# Patient Record
Sex: Female | Born: 1960 | Race: White | Hispanic: No | Marital: Married | Smoking: Former smoker
Health system: Southern US, Community
[De-identification: ages and names within clinical notes are randomized; demographics above are authoritative.]

## PROBLEM LIST (undated history)

## (undated) DIAGNOSIS — Z87442 Personal history of urinary calculi: Secondary | ICD-10-CM

## (undated) DIAGNOSIS — F419 Anxiety disorder, unspecified: Secondary | ICD-10-CM

## (undated) DIAGNOSIS — M419 Scoliosis, unspecified: Secondary | ICD-10-CM

## (undated) DIAGNOSIS — I10 Essential (primary) hypertension: Secondary | ICD-10-CM

## (undated) DIAGNOSIS — E7800 Pure hypercholesterolemia, unspecified: Secondary | ICD-10-CM

## (undated) DIAGNOSIS — E079 Disorder of thyroid, unspecified: Secondary | ICD-10-CM

## (undated) DIAGNOSIS — E0590 Thyrotoxicosis, unspecified without thyrotoxic crisis or storm: Secondary | ICD-10-CM

## (undated) HISTORY — PX: ABDOMINAL HYSTERECTOMY: SHX81

## (undated) HISTORY — PX: TUBAL LIGATION: SHX77

---

## 2004-12-05 ENCOUNTER — Emergency Department (HOSPITAL_COMMUNITY): Admission: EM | Admit: 2004-12-05 | Discharge: 2004-12-06 | Payer: Self-pay | Admitting: *Deleted

## 2007-08-28 ENCOUNTER — Emergency Department (HOSPITAL_COMMUNITY): Admission: EM | Admit: 2007-08-28 | Discharge: 2007-08-28 | Payer: Self-pay | Admitting: Emergency Medicine

## 2008-02-23 ENCOUNTER — Emergency Department (HOSPITAL_COMMUNITY): Admission: EM | Admit: 2008-02-23 | Discharge: 2008-02-23 | Payer: Self-pay | Admitting: Emergency Medicine

## 2008-03-12 ENCOUNTER — Ambulatory Visit (HOSPITAL_COMMUNITY): Admission: RE | Admit: 2008-03-12 | Discharge: 2008-03-12 | Payer: Self-pay | Admitting: Urology

## 2008-03-14 ENCOUNTER — Emergency Department (HOSPITAL_COMMUNITY): Admission: RE | Admit: 2008-03-14 | Discharge: 2008-03-14 | Payer: Self-pay | Admitting: Urology

## 2008-08-07 ENCOUNTER — Emergency Department (HOSPITAL_COMMUNITY): Admission: EM | Admit: 2008-08-07 | Discharge: 2008-08-07 | Payer: Self-pay | Admitting: Emergency Medicine

## 2008-08-08 ENCOUNTER — Ambulatory Visit (HOSPITAL_COMMUNITY): Admission: RE | Admit: 2008-08-08 | Discharge: 2008-08-08 | Payer: Self-pay | Admitting: Urology

## 2008-08-25 ENCOUNTER — Emergency Department (HOSPITAL_COMMUNITY): Admission: EM | Admit: 2008-08-25 | Discharge: 2008-08-25 | Payer: Self-pay | Admitting: Emergency Medicine

## 2008-12-11 ENCOUNTER — Ambulatory Visit (HOSPITAL_COMMUNITY): Admission: RE | Admit: 2008-12-11 | Discharge: 2008-12-11 | Payer: Self-pay | Admitting: Urology

## 2009-12-30 ENCOUNTER — Inpatient Hospital Stay (HOSPITAL_COMMUNITY): Admission: EM | Admit: 2009-12-30 | Discharge: 2010-01-01 | Payer: Self-pay | Admitting: Emergency Medicine

## 2010-05-13 ENCOUNTER — Emergency Department (HOSPITAL_COMMUNITY): Admission: EM | Admit: 2010-05-13 | Discharge: 2010-05-13 | Payer: Self-pay | Admitting: Emergency Medicine

## 2010-09-13 ENCOUNTER — Encounter: Payer: Self-pay | Admitting: Urology

## 2010-11-10 LAB — STONE ANALYSIS: Stone Weight KSTONE: 0.039 g

## 2010-11-10 LAB — GLUCOSE, CAPILLARY
Glucose-Capillary: 136 mg/dL — ABNORMAL HIGH (ref 70–99)
Glucose-Capillary: 164 mg/dL — ABNORMAL HIGH (ref 70–99)

## 2010-11-10 LAB — BASIC METABOLIC PANEL
BUN: 18 mg/dL (ref 6–23)
CO2: 26 mEq/L (ref 19–32)
Calcium: 9.3 mg/dL (ref 8.4–10.5)
Chloride: 103 mEq/L (ref 96–112)
Creatinine, Ser: 0.8 mg/dL (ref 0.4–1.2)
GFR calc Af Amer: >60 mL/min (ref >60)
GFR calc non Af Amer: >60 mL/min (ref >60)
Glucose, Bld: 198 mg/dL — ABNORMAL HIGH (ref 70–99)
Potassium: 4.1 mEq/L (ref 3.5–5.1)

## 2010-11-10 LAB — URINALYSIS, ROUTINE W REFLEX MICROSCOPIC
Bilirubin Urine: NEGATIVE
Glucose, UA: NEGATIVE mg/dL
Nitrite: NEGATIVE
Specific Gravity, Urine: 1.03 (ref 1.005–1.030)
pH: 6 (ref 5.0–8.0)

## 2010-11-10 LAB — HEPATIC FUNCTION PANEL
ALT: 21 U/L (ref 0–35)
Bilirubin, Direct: 0.1 mg/dL (ref 0.0–0.3)
Indirect Bilirubin: 0.6 mg/dL (ref 0.3–0.9)
Total Protein: 6.4 g/dL (ref 6.0–8.3)

## 2010-11-10 LAB — CBC
HCT: 32.1 % — ABNORMAL LOW (ref 36.0–46.0)
Hemoglobin: 12.3 g/dL (ref 12.0–15.0)
MCV: 95.3 fL (ref 78.0–100.0)
Platelets: 181 10*3/uL (ref 150–400)
Platelets: 199 10*3/uL (ref 150–400)
RBC: 3.64 MIL/uL — ABNORMAL LOW (ref 3.87–5.11)
RDW: 12.7 % (ref 11.5–15.5)
RDW: 12.9 % (ref 11.5–15.5)
WBC: 11.4 10*3/uL — ABNORMAL HIGH (ref 4.0–10.5)

## 2010-11-10 LAB — LACTIC ACID, PLASMA: Lactic Acid, Venous: 2.4 mmol/L — ABNORMAL HIGH (ref 0.5–2.2)

## 2010-11-10 LAB — DIFFERENTIAL
Basophils Absolute: 0 10*3/uL (ref 0.0–0.1)
Basophils Absolute: 0 10*3/uL (ref 0.0–0.1)
Basophils Relative: 0 % (ref 0–1)
Eosinophils Absolute: 0 10*3/uL (ref 0.0–0.7)
Eosinophils Absolute: 0.1 10*3/uL (ref 0.0–0.7)
Eosinophils Relative: 1 % (ref 0–5)
Lymphocytes Relative: 16 % (ref 12–46)
Lymphs Abs: 1.9 10*3/uL (ref 0.7–4.0)
Monocytes Absolute: 0.5 10*3/uL (ref 0.1–1.0)

## 2010-12-07 LAB — URINALYSIS, ROUTINE W REFLEX MICROSCOPIC
Bilirubin Urine: NEGATIVE
Ketones, ur: NEGATIVE mg/dL
Nitrite: NEGATIVE
Urobilinogen, UA: 0.2 mg/dL (ref 0.0–1.0)

## 2010-12-07 LAB — URINE MICROSCOPIC-ADD ON

## 2011-02-05 ENCOUNTER — Encounter (HOSPITAL_COMMUNITY): Payer: Self-pay | Admitting: Radiology

## 2011-02-05 ENCOUNTER — Emergency Department (HOSPITAL_COMMUNITY)
Admission: EM | Admit: 2011-02-05 | Discharge: 2011-02-06 | Disposition: A | Discharge status: Home / Self Care | Payer: Self-pay | Attending: Emergency Medicine | Admitting: Emergency Medicine

## 2011-02-05 ENCOUNTER — Emergency Department (HOSPITAL_COMMUNITY): Payer: Self-pay

## 2011-02-05 DIAGNOSIS — M545 Low back pain, unspecified: Secondary | ICD-10-CM | POA: Insufficient documentation

## 2011-02-05 DIAGNOSIS — E119 Type 2 diabetes mellitus without complications: Secondary | ICD-10-CM | POA: Insufficient documentation

## 2011-02-05 DIAGNOSIS — Z87442 Personal history of urinary calculi: Secondary | ICD-10-CM | POA: Insufficient documentation

## 2011-02-05 DIAGNOSIS — Z79899 Other long term (current) drug therapy: Secondary | ICD-10-CM | POA: Insufficient documentation

## 2011-02-05 DIAGNOSIS — R109 Unspecified abdominal pain: Secondary | ICD-10-CM | POA: Insufficient documentation

## 2011-02-05 DIAGNOSIS — I10 Essential (primary) hypertension: Secondary | ICD-10-CM | POA: Insufficient documentation

## 2011-02-05 HISTORY — DX: Essential (primary) hypertension: I10

## 2011-02-05 LAB — BASIC METABOLIC PANEL
BUN: 14 mg/dL (ref 6–23)
CO2: 30 mEq/L (ref 19–32)
Calcium: 10.6 mg/dL — ABNORMAL HIGH (ref 8.4–10.5)
Chloride: 101 mEq/L (ref 96–112)
Creatinine, Ser: 0.62 mg/dL (ref 0.50–1.10)
GFR calc Af Amer: >60 mL/min (ref >60)
GFR calc non Af Amer: >60 mL/min (ref >60)
Glucose, Bld: 153 mg/dL — ABNORMAL HIGH (ref 70–99)
Potassium: 3.8 mEq/L (ref 3.5–5.1)
Sodium: 140 mEq/L (ref 135–145)

## 2011-02-05 LAB — DIFFERENTIAL
Basophils Absolute: 0 10*3/uL (ref 0.0–0.1)
Lymphocytes Relative: 43 % (ref 12–46)
Monocytes Absolute: 0.5 10*3/uL (ref 0.1–1.0)
Neutro Abs: 3.5 10*3/uL (ref 1.7–7.7)

## 2011-02-05 LAB — URINALYSIS, ROUTINE W REFLEX MICROSCOPIC
Bilirubin Urine: NEGATIVE
Hgb urine dipstick: NEGATIVE
Nitrite: NEGATIVE
Specific Gravity, Urine: <1.005 — ABNORMAL LOW (ref 1.005–1.030)
pH: 7 (ref 5.0–8.0)

## 2011-02-05 LAB — CBC
HCT: 35.2 % — ABNORMAL LOW (ref 36.0–46.0)
Hemoglobin: 12.5 g/dL (ref 12.0–15.0)
MCH: 32.9 pg (ref 26.0–34.0)
MCHC: 35.5 g/dL (ref 30.0–36.0)
MCV: 92.6 fL (ref 78.0–100.0)
Platelets: 194 10*3/uL (ref 150–400)
RBC: 3.8 MIL/uL — ABNORMAL LOW (ref 3.87–5.11)
RDW: 12.1 % (ref 11.5–15.5)
WBC: 7.1 10*3/uL (ref 4.0–10.5)

## 2011-05-12 LAB — URINALYSIS, ROUTINE W REFLEX MICROSCOPIC
Bilirubin Urine: NEGATIVE
Glucose, UA: NEGATIVE
Specific Gravity, Urine: 1.015
pH: 6.5

## 2011-05-12 LAB — URINE MICROSCOPIC-ADD ON

## 2011-05-18 IMAGING — CT CT ABD-PELV W/O CM
2 of 4 series · 16 of 46 positions shown, 18 images · non-contrast
Comparison: 12/11/2008.

CLINICAL DATA: Left side pain.  Left flank pain.  Hematuria.

CT ABDOMEN AND PELVIS WITHOUT CONTRAST
TECHNIQUE: Multidetector CT imaging of the abdomen and pelvis was
performed following the standard protocol without intravenous
contrast.

[Series 2: standard/full over (age)lbs 5.0 · axial · 0.71mm/px · z∈[-423,+2]mm · 13 of 93 slices shown, 15 images]
[im 4/93  soft-tissue]
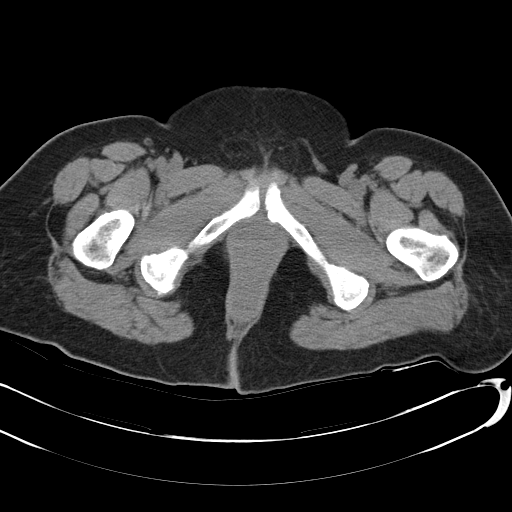
[im 4/93  bone]
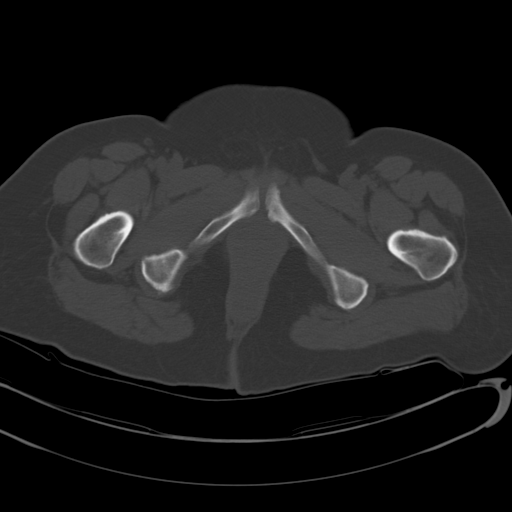
[im 12/93  soft-tissue]
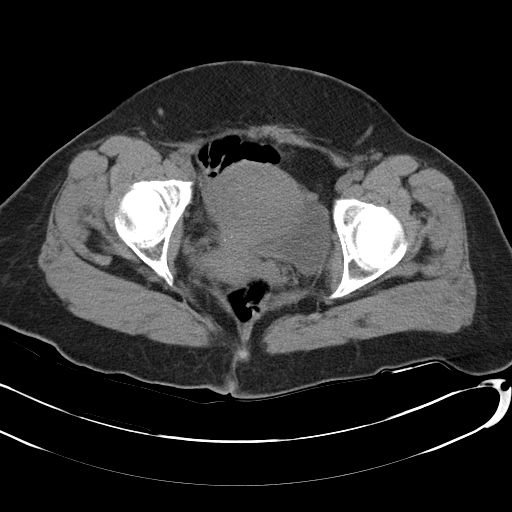
[im 19/93  soft-tissue]
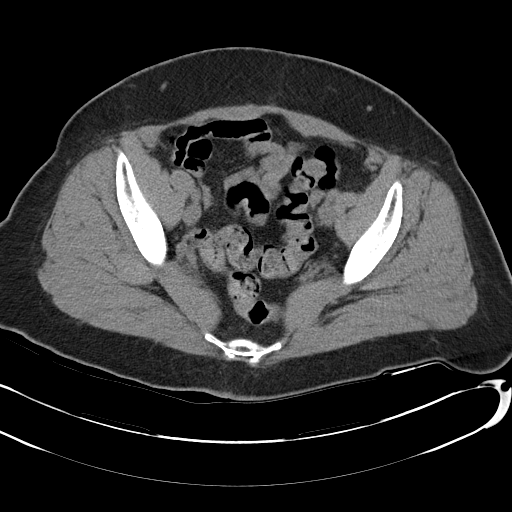
[im 26/93  soft-tissue]
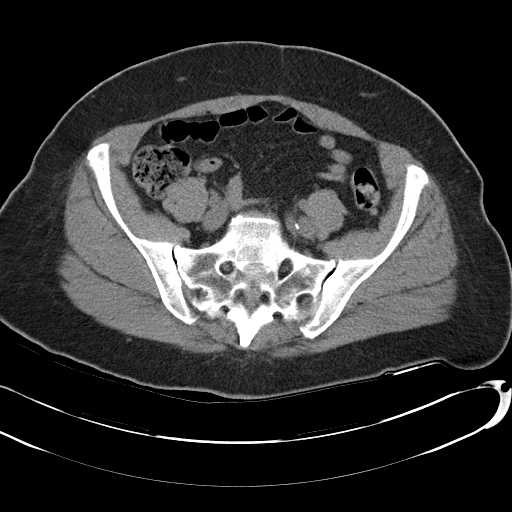
[im 34/93  soft-tissue]
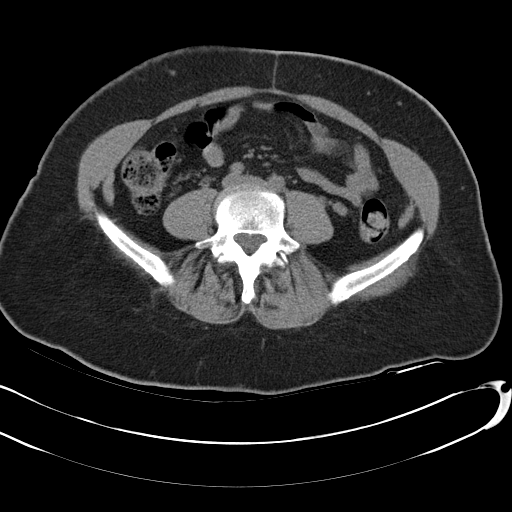
[im 41/93  soft-tissue]
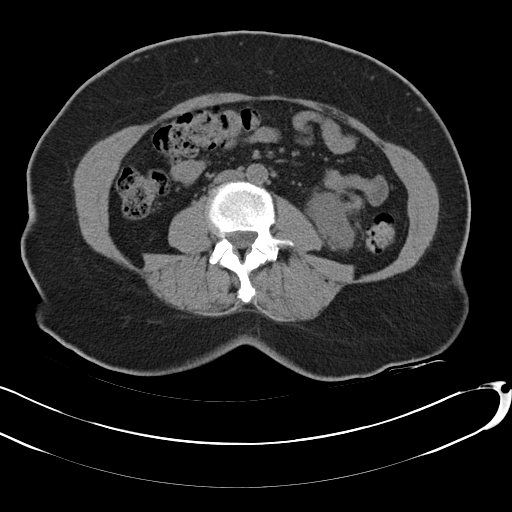
[im 48/93  soft-tissue]
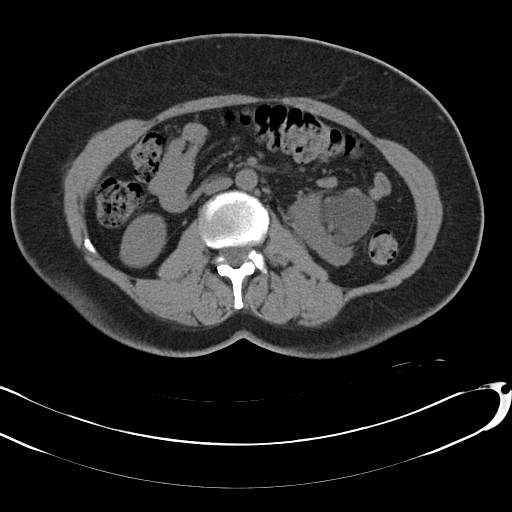
[im 52/93  soft-tissue]
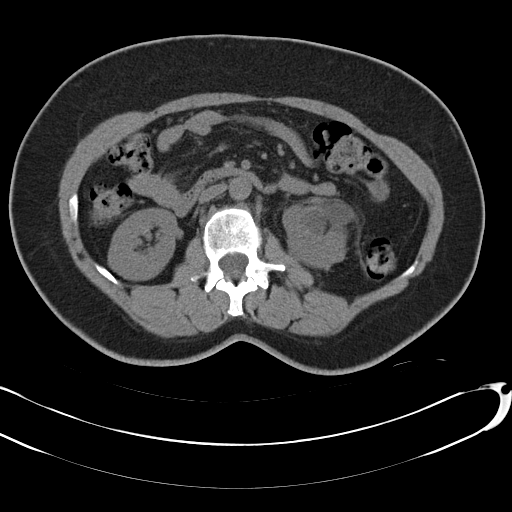
[im 59/93  soft-tissue]
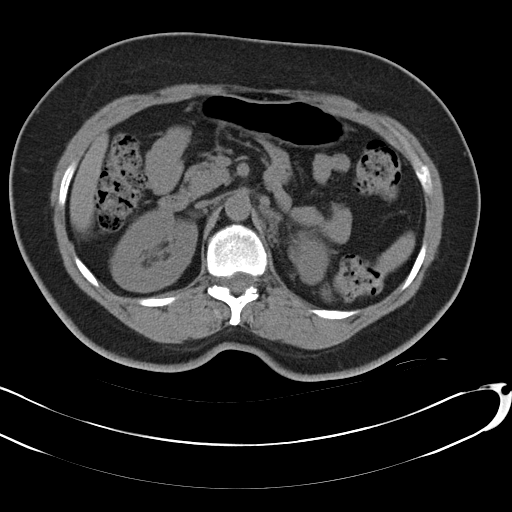
[im 59/93  bone]
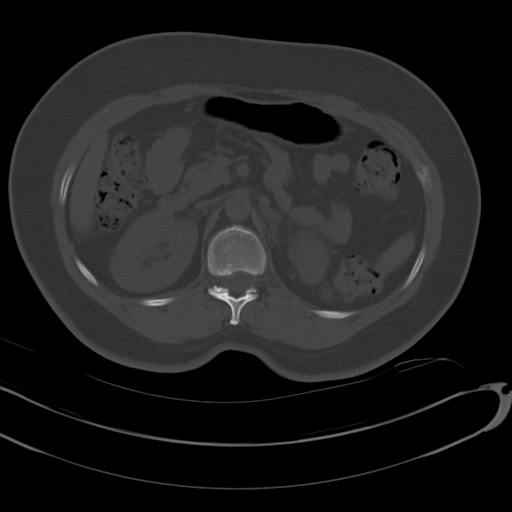
[im 67/93  soft-tissue]
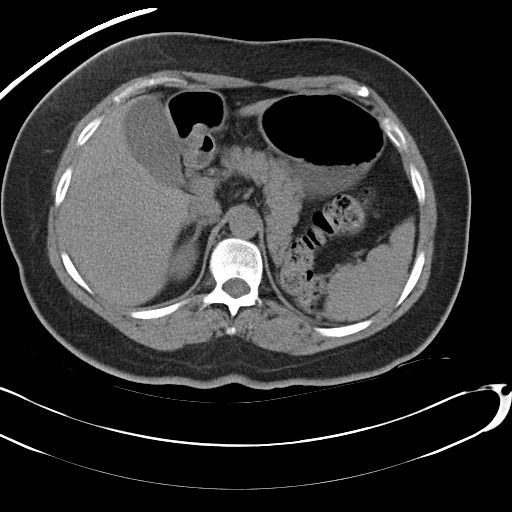
[im 74/93  soft-tissue]
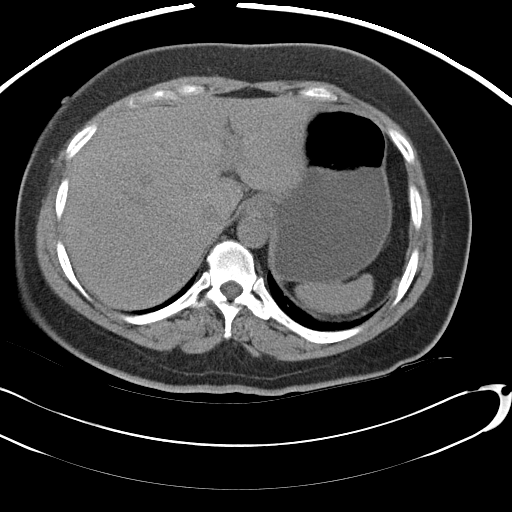
[im 81/93  soft-tissue]
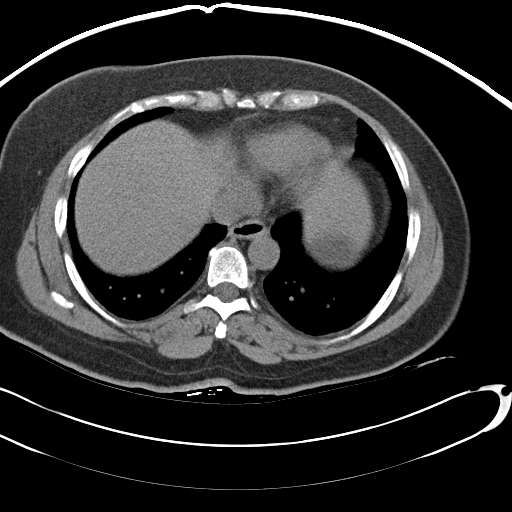
[im 89/93  soft-tissue]
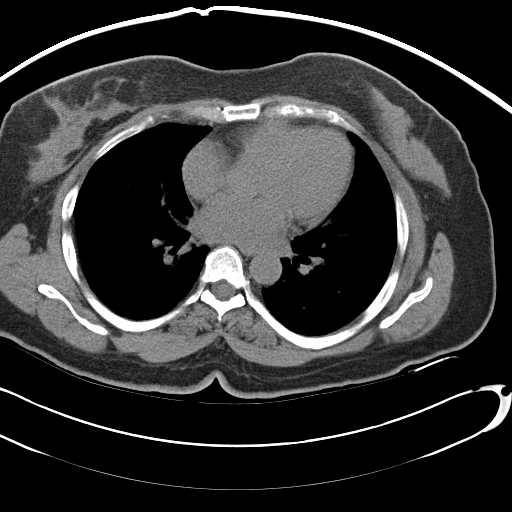

[Series 4: mpr coronal · coronal · 0.69mm/px · 3 of 76 slices shown]
[im 26/76  soft-tissue]
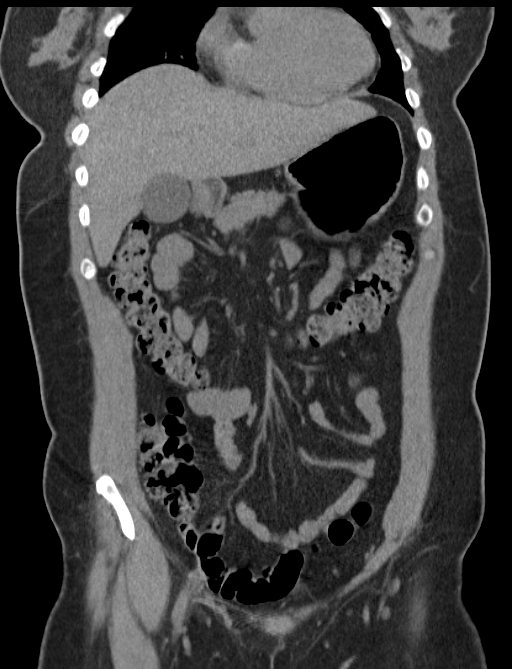
[im 34/76  soft-tissue]
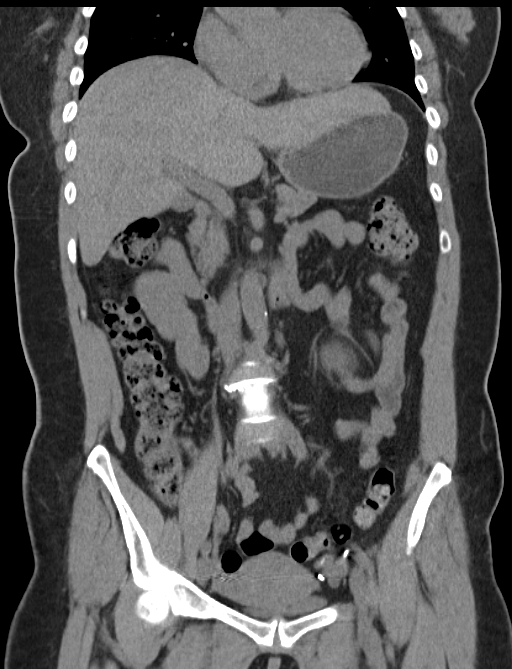
[im 42/76  soft-tissue]
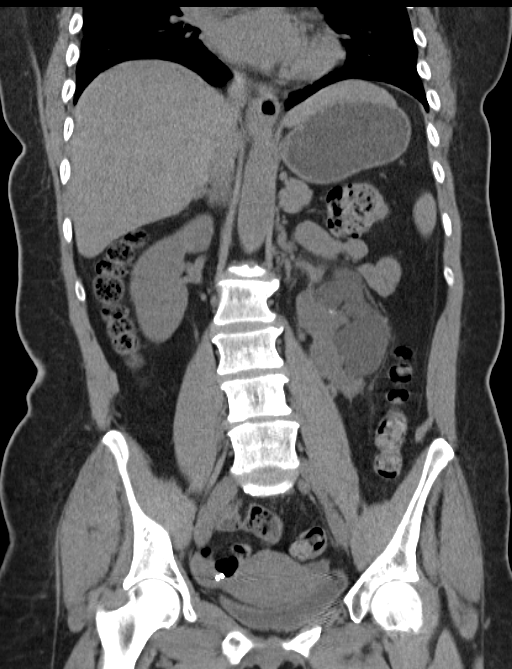

[16 of 46 positions shown; findings below may reference images not displayed]

FINDINGS: Dependent atelectasis of the lung bases. Unenhanced CT
was performed per clinician order.  Lack of IV contrast limits
sensitivity and specificity, especially for evaluation of
abdominal/pelvic solid viscera. . Multiple punctate nonobstructing
right renal collecting system calculi are present.  The right
ureter appears within normal limits.

The left kidney is abnormal.  Left renal ptosis.  Multiple
nonobstructing renal collecting system calculi are present.  There
is malrotation of the left kidney, with the pelvis facing lateral
rather than medial.  Moderate left hydronephrosis.  Hydronephrosis
is slightly more prominent than on the prior exam of 4515.
Periureteric inflammatory changes are present extending into the
anatomic pelvis.  Colon and small bowel appear within normal
limits.  Uterus is anteverted. Tubal ligation clips present in the
anatomic pelvis. No free fluid in the anatomic pelvis.  Bilateral
SI joint degenerative disease.
IMPRESSION: Malrotated and ptotic left kidney with moderately dilated left
renal pelvis facing laterally.  An obstructing 5 mm calculus is
present at the left ureteropelvic junction.  Small nonobstructing
calculi remain within the collecting systems bilaterally.

## 2011-05-20 LAB — DIFFERENTIAL
Eosinophils Absolute: 0.1
Lymphs Abs: 2.6
Monocytes Relative: 8
Neutro Abs: 5.5
Neutrophils Relative %: 62

## 2011-05-20 LAB — CBC
HCT: 36.8
Hemoglobin: 12.6
MCHC: 34.4

## 2011-05-20 LAB — BASIC METABOLIC PANEL
BUN: 11
CO2: 30
Calcium: 9
GFR calc non Af Amer: >60
Glucose, Bld: 153 — ABNORMAL HIGH
Potassium: 4

## 2011-05-20 LAB — URINALYSIS, ROUTINE W REFLEX MICROSCOPIC
Ketones, ur: NEGATIVE
Protein, ur: NEGATIVE
Urobilinogen, UA: 0.2

## 2011-05-20 LAB — URINE MICROSCOPIC-ADD ON

## 2011-05-28 LAB — URINALYSIS, ROUTINE W REFLEX MICROSCOPIC
Glucose, UA: NEGATIVE mg/dL
Specific Gravity, Urine: 1.02 (ref 1.005–1.030)

## 2011-05-28 LAB — URINE MICROSCOPIC-ADD ON

## 2011-05-28 LAB — DIFFERENTIAL
Basophils Absolute: 0 10*3/uL (ref 0.0–0.1)
Eosinophils Relative: 1 % (ref 0–5)
Lymphocytes Relative: 40 % (ref 12–46)
Lymphs Abs: 3.2 10*3/uL (ref 0.7–4.0)
Neutro Abs: 4 10*3/uL (ref 1.7–7.7)
Neutrophils Relative %: 51 % (ref 43–77)

## 2011-05-28 LAB — BASIC METABOLIC PANEL
BUN: 14 mg/dL (ref 6–23)
Calcium: 9.8 mg/dL (ref 8.4–10.5)
Creatinine, Ser: 0.79 mg/dL (ref 0.4–1.2)
GFR calc non Af Amer: >60 mL/min (ref >60)
Glucose, Bld: 199 mg/dL — ABNORMAL HIGH (ref 70–99)
Potassium: 3.7 mEq/L (ref 3.5–5.1)

## 2011-05-28 LAB — CBC
Platelets: 248 10*3/uL (ref 150–400)
RDW: 13.2 % (ref 11.5–15.5)
WBC: 7.9 10*3/uL (ref 4.0–10.5)

## 2011-09-03 ENCOUNTER — Encounter (HOSPITAL_COMMUNITY): Payer: Self-pay

## 2011-09-03 DIAGNOSIS — Z87442 Personal history of urinary calculi: Secondary | ICD-10-CM | POA: Insufficient documentation

## 2011-09-03 DIAGNOSIS — I10 Essential (primary) hypertension: Secondary | ICD-10-CM | POA: Insufficient documentation

## 2011-09-03 DIAGNOSIS — M25519 Pain in unspecified shoulder: Secondary | ICD-10-CM | POA: Insufficient documentation

## 2011-09-03 DIAGNOSIS — R109 Unspecified abdominal pain: Secondary | ICD-10-CM | POA: Insufficient documentation

## 2011-09-03 DIAGNOSIS — E119 Type 2 diabetes mellitus without complications: Secondary | ICD-10-CM | POA: Insufficient documentation

## 2011-09-03 LAB — URINALYSIS, ROUTINE W REFLEX MICROSCOPIC
Glucose, UA: NEGATIVE mg/dL
Ketones, ur: NEGATIVE mg/dL
Leukocytes, UA: NEGATIVE
Nitrite: NEGATIVE
Specific Gravity, Urine: 1.02 (ref 1.005–1.030)
pH: 6 (ref 5.0–8.0)

## 2011-09-03 LAB — URINE MICROSCOPIC-ADD ON

## 2011-09-03 NOTE — ED Notes (Addendum)
Pt presents with flank pain and left shoulder pain that radiates to left arm x 2 weeks. Pt denies all other symptoms.

## 2011-09-04 ENCOUNTER — Emergency Department (HOSPITAL_COMMUNITY): Payer: Self-pay

## 2011-09-04 ENCOUNTER — Emergency Department (HOSPITAL_COMMUNITY)
Admission: EM | Admit: 2011-09-04 | Discharge: 2011-09-04 | Disposition: A | Discharge status: Home / Self Care | Payer: Self-pay | Attending: Emergency Medicine | Admitting: Emergency Medicine

## 2011-09-04 DIAGNOSIS — R109 Unspecified abdominal pain: Secondary | ICD-10-CM

## 2011-09-04 DIAGNOSIS — M25519 Pain in unspecified shoulder: Secondary | ICD-10-CM

## 2011-09-04 MED ORDER — KETOROLAC TROMETHAMINE 60 MG/2ML IM SOLN
60.0000 mg | Freq: Once | INTRAMUSCULAR | Status: AC
  Administered 2011-09-04: 60 mg via INTRAMUSCULAR
  Filled 2011-09-04: qty 2

## 2011-09-04 MED ORDER — HYDROCODONE-ACETAMINOPHEN 5-500 MG PO TABS: 1.0000 | ORAL_TABLET | Freq: Four times a day (QID) | ORAL | Status: AC | PRN

## 2011-09-04 NOTE — ED Notes (Signed)
Triage reassessment:  Patient continues to c/o left flank pain and left shoulder pain.  States has had kidney stones in the past.

## 2011-09-04 NOTE — ED Provider Notes (Signed)
History     CSN: [id number]  Arrival date & time 09/03/11  2149   First MD Initiated Contact with Patient 09/04/11 0134      Chief Complaint  Patient presents with  . Flank Pain  . Shoulder Pain    (Consider location/radiation/quality/duration/timing/severity/associated sxs/prior treatment) HPI Comments: Left shoulder pain for two weeks, no injury or trauma.  Started today with left flank pain.  Has history of kidney stones, feels the same.  No urinary complaints.  No bowel complaints.    Patient is a 51 y.o. female presenting with flank pain and shoulder pain. The history is provided by the patient.  Flank Pain This is a new problem. The current episode started 6 to 12 hours ago. The problem occurs constantly. The problem has been gradually worsening. Pertinent negatives include no chest pain. The symptoms are aggravated by nothing. The symptoms are relieved by nothing. She has tried nothing for the symptoms.  Shoulder Pain Pertinent negatives include no chest pain.    Past Medical History  Diagnosis Date  . Diabetes mellitus   . Hypertension     History reviewed. No pertinent past surgical history.  No family history on file.  History  Substance Use Topics  . Smoking status: Never Smoker   . Smokeless tobacco: Not on file  . Alcohol Use: No    OB History    Grav Para Term Preterm Abortions TAB SAB Ect Mult Living                  Review of Systems  Cardiovascular: Negative for chest pain.  Genitourinary: Positive for flank pain.  All other systems reviewed and are negative.    Allergies  Review of patient's allergies indicates no known allergies.  Home Medications  No current outpatient prescriptions on file.  BP 129/63  Pulse 65  Temp(Src) 98.1 F (36.7 C) (Oral)  Resp 20  Ht 5\' 2"  (1.575 m)  Wt 160 lb (72.576 kg)  BMI 29.26 kg/m2  SpO2 100%  LMP 09/06/2010  Physical Exam  Nursing note and vitals reviewed. Constitutional: She is oriented to  person, place, and time. She appears well-developed and well-nourished. No distress.  HENT:  Head: Normocephalic and atraumatic.  Neck: Normal range of motion. Neck supple.  Cardiovascular: Normal rate and regular rhythm.  Exam reveals no gallop and no friction rub.   No murmur heard. Pulmonary/Chest: Effort normal and breath sounds normal. No respiratory distress. She has no wheezes.  Abdominal: Soft. Bowel sounds are normal. She exhibits no distension. There is no tenderness.  Musculoskeletal: Normal range of motion.       There is ttp in top and posterior aspect of the left shoulder.  There is good range of motion with some discomfort and is neurovasc intact.  Neurological: She is alert and oriented to person, place, and time.  Skin: Skin is warm and dry. She is not diaphoretic.    ED Course  Procedures (including critical care time)  Labs Reviewed  URINALYSIS, ROUTINE W REFLEX MICROSCOPIC - Abnormal; Notable for the following:    Hgb urine dipstick TRACE (*)    All other components within normal limits  URINE MICROSCOPIC-ADD ON - Abnormal; Notable for the following:    Squamous Epithelial / LPF FEW (*)    All other components within normal limits   No results found.   No diagnosis found.    MDM  No signs of obstructing stones on ct or abnormality of the shoulder on  xray.  Will prescribe pain meds, give time.        Geoffery Lyons, MD 09/04/11 [phone]

## 2013-05-02 ENCOUNTER — Telehealth: Payer: Self-pay

## 2013-05-02 NOTE — Telephone Encounter (Signed)
Pt is at the age for a tcs without any problems and would like to be triaged. She can be reached at [phone] or [phone]

## 2013-05-08 ENCOUNTER — Other Ambulatory Visit: Payer: Self-pay

## 2013-05-08 ENCOUNTER — Telehealth: Payer: Self-pay

## 2013-05-08 DIAGNOSIS — Z1211 Encounter for screening for malignant neoplasm of colon: Secondary | ICD-10-CM

## 2013-05-08 NOTE — Telephone Encounter (Signed)
PREPOPIK-DRINK WATER TO KEEP URINE LIGHT YELLOW.  PT SHOULD DROP OFF RX 3 DAYS PRIOR TO PROCEDURE.  

## 2013-05-08 NOTE — Telephone Encounter (Signed)
LMOM for a return call.  

## 2013-05-08 NOTE — Telephone Encounter (Signed)
See separate triage.  

## 2013-05-08 NOTE — Telephone Encounter (Signed)
Gastroenterology Pre-Procedure Review  Request Date: 05/08/2013 Requesting Physician: [facility]  PATIENT REVIEW QUESTIONS: The patient responded to the following health history questions as indicated:    1. Diabetes Melitis: YES 2. Joint replacements in the past 12 months: no 3. Major health problems in the past 3 months: no 4. Has an artificial valve or MVP: no 5. Has a defibrillator: no 6. Has been advised in past to take antibiotics in advance of a procedure like teeth cleaning: no    MEDICATIONS & ALLERGIES:    Patient reports the following regarding taking any blood thinners:   Plavix? no Aspirin? no Coumadin? no  Patient confirms/reports the following medications:  Current Outpatient Prescriptions  Medication Sig Dispense Refill  . lisinopril (PRINIVIL,ZESTRIL) 10 MG tablet Take 10 mg by mouth daily.      . NON FORMULARY Vitamin D      Once daily      . sitaGLIPtin-metformin (JANUMET) 50-1000 MG per tablet Take 1 tablet by mouth 2 (two) times daily with a meal.       No current facility-administered medications for this visit.    Patient confirms/reports the following allergies:  No Known Allergies  No orders of the defined types were placed in this encounter.    AUTHORIZATION INFORMATION Primary Insurance:   ID #:   Group #:  Pre-Cert / Auth required: Pre-Cert / Auth #:   Secondary Insurance:   ID #:   Group #:  Pre-Cert / Auth required:  Pre-Cert / Auth #:    SCHEDULE INFORMATION: Procedure has been scheduled as follows:  Date:  06/04/2013     Time: 9:30 Am Location: [facility] Short Stay  This Gastroenterology Pre-Precedure Review Form is being routed to the following provider(s): Jonette Eva, MD

## 2013-05-10 NOTE — Telephone Encounter (Signed)
Any change any Janumet?

## 2013-05-10 NOTE — Telephone Encounter (Signed)
HOLD JANUMET ON EVENING PRIOR TO AND MORNING OF TCS.

## 2013-05-15 MED ORDER — SOD PICOSULFATE-MAG OX-CIT ACD 10-3.5-12 MG-GM-GM PO PACK: 1.0000 | PACK | ORAL | Status: DC

## 2013-05-15 NOTE — Telephone Encounter (Signed)
Rx sent to pharmacy and instructions mailed to pt.  

## 2013-05-16 ENCOUNTER — Telehealth: Payer: Self-pay

## 2013-05-16 MED ORDER — PEG 3350-KCL-NA BICARB-NACL 420 G PO SOLR: 4000.0000 mL | ORAL | Status: DC

## 2013-05-16 MED ORDER — PEG-KCL-NACL-NASULF-NA ASC-C 100 G PO SOLR: 1.0000 | ORAL | Status: DC

## 2013-05-16 NOTE — Addendum Note (Signed)
Addended by: Lavena Bullion on: 05/16/2013 09:10 AM   Modules accepted: Orders

## 2013-05-16 NOTE — Telephone Encounter (Signed)
Movie prep was not covered. [facility] sent and discarded the movie prep instructions before they got mailed and mailed instructions for the [place].

## 2013-05-16 NOTE — Telephone Encounter (Signed)
Called and informed pt.  

## 2013-05-16 NOTE — Telephone Encounter (Signed)
REVIEWED.  

## 2013-05-16 NOTE — Telephone Encounter (Signed)
Prepopik not covered. Sending in new prescription for Movie prep, and mailing new instructions.

## 2013-05-16 NOTE — Addendum Note (Signed)
Addended by: Lavena Bullion on: 05/16/2013 10:01 AM   Modules accepted: Orders

## 2013-05-18 ENCOUNTER — Encounter (HOSPITAL_COMMUNITY): Payer: Self-pay | Admitting: Pharmacy Technician

## 2013-06-04 ENCOUNTER — Encounter (HOSPITAL_COMMUNITY)
Admission: RE | Disposition: A | Discharge status: Home / Self Care | Payer: Self-pay | Source: Ambulatory Visit | Attending: Gastroenterology

## 2013-06-04 ENCOUNTER — Ambulatory Visit (HOSPITAL_COMMUNITY)
Admission: RE | Admit: 2013-06-04 | Discharge: 2013-06-04 | Disposition: A | Discharge status: Home / Self Care | Payer: BC Managed Care – PPO | Source: Ambulatory Visit | Attending: Gastroenterology | Admitting: Gastroenterology

## 2013-06-04 ENCOUNTER — Encounter (HOSPITAL_COMMUNITY): Payer: Self-pay | Admitting: *Deleted

## 2013-06-04 DIAGNOSIS — Z1211 Encounter for screening for malignant neoplasm of colon: Secondary | ICD-10-CM | POA: Insufficient documentation

## 2013-06-04 DIAGNOSIS — E119 Type 2 diabetes mellitus without complications: Secondary | ICD-10-CM | POA: Insufficient documentation

## 2013-06-04 DIAGNOSIS — K5730 Diverticulosis of large intestine without perforation or abscess without bleeding: Secondary | ICD-10-CM | POA: Insufficient documentation

## 2013-06-04 DIAGNOSIS — K648 Other hemorrhoids: Secondary | ICD-10-CM | POA: Insufficient documentation

## 2013-06-04 DIAGNOSIS — I10 Essential (primary) hypertension: Secondary | ICD-10-CM | POA: Insufficient documentation

## 2013-06-04 DIAGNOSIS — Z01812 Encounter for preprocedural laboratory examination: Secondary | ICD-10-CM | POA: Insufficient documentation

## 2013-06-04 HISTORY — PX: COLONOSCOPY: SHX5424

## 2013-06-04 HISTORY — DX: Pure hypercholesterolemia, unspecified: E78.00

## 2013-06-04 LAB — GLUCOSE, CAPILLARY

## 2013-06-04 SURGERY — COLONOSCOPY
Anesthesia: Moderate Sedation

## 2013-06-04 MED ORDER — SIMETHICONE 40 MG/0.6ML PO SUSP
ORAL | Status: DC | PRN
  Administered 2013-06-04: 10:00:00

## 2013-06-04 MED ORDER — MEPERIDINE HCL 100 MG/ML IJ SOLN
INTRAMUSCULAR | Status: AC
  Filled 2013-06-04: qty 2

## 2013-06-04 MED ORDER — SODIUM CHLORIDE 0.9 % IV SOLN
INTRAVENOUS | Status: DC
  Administered 2013-06-04: 09:00:00 via INTRAVENOUS

## 2013-06-04 MED ORDER — MIDAZOLAM HCL 5 MG/5ML IJ SOLN
INTRAMUSCULAR | Status: DC | PRN
  Administered 2013-06-04: 2 mg via INTRAVENOUS
  Administered 2013-06-04 (×2): 1 mg via INTRAVENOUS
  Administered 2013-06-04: 2 mg via INTRAVENOUS

## 2013-06-04 MED ORDER — MIDAZOLAM HCL 5 MG/5ML IJ SOLN
INTRAMUSCULAR | Status: AC
  Filled 2013-06-04: qty 10

## 2013-06-04 MED ORDER — MEPERIDINE HCL 100 MG/ML IJ SOLN
INTRAMUSCULAR | Status: DC | PRN
  Administered 2013-06-04: 25 mg via INTRAVENOUS
  Administered 2013-06-04: 50 mg via INTRAVENOUS
  Administered 2013-06-04: 25 mg via INTRAVENOUS

## 2013-06-04 NOTE — H&P (Signed)
  Primary Care Physician:  Melynda Keller, NP Primary Gastroenterologist:  Dr. Darrick Penna  Pre-Procedure History & Physical: HPI:  Misty Cruz is a 52 y.o. female here for COLON CANCER SCREENING.   Past Medical History  Diagnosis Date  . Diabetes mellitus   . Hypertension   . Hypercholesteremia     Past Surgical History  Procedure Laterality Date  . Abdominal hysterectomy      Prior to Admission medications   Medication Sig Start Date End Date Taking? Authorizing Provider  atorvastatin (LIPITOR) 10 MG tablet Take 10 mg by mouth daily.   Yes Historical Provider, MD  Cholecalciferol (VITAMIN D PO) Take 1 tablet by mouth daily.   Yes Historical Provider, MD  ibuprofen (ADVIL,MOTRIN) 200 MG tablet Take 600 mg by mouth every 6 (six) hours as needed for pain.   Yes Historical Provider, MD  lisinopril (PRINIVIL,ZESTRIL) 10 MG tablet Take 10 mg by mouth daily.   Yes Historical Provider, MD  polyethylene glycol-electrolytes (TRILYTE) 420 G solution Take 4,000 mLs by mouth as directed. 05/16/13  Yes West Bali, MD  sitaGLIPtin-metformin (JANUMET) 50-1000 MG per tablet Take 1 tablet by mouth 2 (two) times daily with a meal.   Yes Historical Provider, MD  peg 3350 powder (MOVIPREP) 100 G SOLR Take 1 kit (200 g total) by mouth as directed. 05/16/13   West Bali, MD  polyethylene glycol-electrolytes (TRILYTE) 420 G solution Take 4,000 mLs by mouth as directed. 05/16/13   West Bali, MD    Allergies as of 05/08/2013  . (No Known Allergies)    Family History  Problem Relation Age of Onset  . Colon cancer Neg Hx     History   Social History  . Marital Status: Married    Spouse Name: N/A    Number of Children: N/A  . Years of Education: N/A   Occupational History  . Not on file.   Social History Main Topics  . Smoking status: Former Smoker -- 0.50 packs/day for 3 years    Types: Cigarettes  . Smokeless tobacco: Not on file  . Alcohol Use: No  . Drug Use: No  .  Sexual Activity: Not on file   Other Topics Concern  . Not on file   Social History Narrative  . No narrative on file    Review of Systems: See HPI, otherwise negative ROS   Physical Exam: BP 110/75  Pulse 68  Temp(Src) 97.6 F (36.4 C) (Oral)  Resp 18  Ht 4\' 11"  (1.499 m)  Wt 150 lb (68.04 kg)  BMI 30.28 kg/m2  SpO2 100%  LMP 09/06/2010 General:   Alert,  pleasant and cooperative in NAD Head:  Normocephalic and atraumatic. Neck:  Supple; Lungs:  Clear throughout to auscultation.    Heart:  Regular rate and rhythm. Abdomen:  Soft, nontender and nondistended. Normal bowel sounds, without guarding, and without rebound.   Neurologic:  Alert and  oriented x4;  grossly normal neurologically.  Impression/Plan:     SCREENING  Plan:  1. TCS TODAY

## 2013-06-04 NOTE — Op Note (Signed)
[facility] [address] [place], [address]   COLONOSCOPY PROCEDURE REPORT  PATIENT: Misty Cruz, Misty Cruz  MR#: [id number] BIRTHDATE: Feb 01, 1961 , 52  yrs. old GENDER: Female ENDOSCOPIST: Jonette Eva, MD REFERRED BY:   PATTY A Cecille Aver-, NP-c, YANCEYVILLE, Kathryn  PROCEDURE DATE:  06/04/2013 PROCEDURE:   Colonoscopy, screening INDICATIONS:Average risk patient for colon cancer. MEDICATIONS: Demerol 100 mg IV and Versed 6 mg IV  DESCRIPTION OF PROCEDURE:    Physical exam was performed.  Informed consent was obtained from the patient after explaining the benefits, risks, and alternatives to procedure.  The patient was connected to monitor and placed in left lateral position. Continuous oxygen was provided by nasal cannula and IV medicine administered through an indwelling cannula.  After administration of sedation and rectal exam, the patients rectum was intubated and the EC-3890Li ([id number])  colonoscope was advanced under direct visualization to the cecum.  The scope was removed slowly by carefully examining the color, texture, anatomy, and integrity mucosa on the way out.  The patient was recovered in endoscopy and discharged home in satisfactory condition.    COLON FINDINGS: The colon IS redundant.  The patient was moved on to their back to reach the cecum.  Manual abdominal counter-pressure was used to reach the cecum, Moderate sized internal hemorrhoids were found.  , and Mild diverticulosis was noted in the descending colon and sigmoid colon.  PREP QUALITY: good.  CECAL W/D TIME: 10 minutes     COMPLICATIONS: None  ENDOSCOPIC IMPRESSION: 1.   The colon ISredundant 2.   MILD LEFT SIDED DIVERTICULOSIS 3.   Moderate sized internal hemorrhoids  RECOMMENDATIONS: Follow a HIGH FIBER DIET.  AVOID ITEMS THAT CAUSE BLOATING. Next colonoscopy in 10 years. CONSIDER OVERTUBE.       _______________________________ Rosalie DoctorJonette Eva, MD 06/04/2013  3:28 PM

## 2013-06-05 ENCOUNTER — Encounter (HOSPITAL_COMMUNITY): Payer: Self-pay | Admitting: Gastroenterology

## 2015-12-10 DIAGNOSIS — E119 Type 2 diabetes mellitus without complications: Secondary | ICD-10-CM | POA: Insufficient documentation

## 2017-05-28 ENCOUNTER — Emergency Department (HOSPITAL_COMMUNITY): Payer: No Typology Code available for payment source

## 2017-05-28 ENCOUNTER — Emergency Department (HOSPITAL_COMMUNITY)
Admission: EM | Admit: 2017-05-28 | Discharge: 2017-05-28 | Disposition: A | Discharge status: Home / Self Care | Payer: No Typology Code available for payment source | Attending: Emergency Medicine | Admitting: Emergency Medicine

## 2017-05-28 ENCOUNTER — Encounter (HOSPITAL_COMMUNITY): Payer: Self-pay | Admitting: Emergency Medicine

## 2017-05-28 DIAGNOSIS — Y92410 Unspecified street and highway as the place of occurrence of the external cause: Secondary | ICD-10-CM | POA: Insufficient documentation

## 2017-05-28 DIAGNOSIS — Y9389 Activity, other specified: Secondary | ICD-10-CM | POA: Insufficient documentation

## 2017-05-28 DIAGNOSIS — Z79899 Other long term (current) drug therapy: Secondary | ICD-10-CM | POA: Diagnosis not present

## 2017-05-28 DIAGNOSIS — S4992XA Unspecified injury of left shoulder and upper arm, initial encounter: Secondary | ICD-10-CM | POA: Diagnosis present

## 2017-05-28 DIAGNOSIS — S40022A Contusion of left upper arm, initial encounter: Secondary | ICD-10-CM | POA: Insufficient documentation

## 2017-05-28 DIAGNOSIS — E119 Type 2 diabetes mellitus without complications: Secondary | ICD-10-CM | POA: Diagnosis not present

## 2017-05-28 DIAGNOSIS — Y999 Unspecified external cause status: Secondary | ICD-10-CM | POA: Diagnosis not present

## 2017-05-28 DIAGNOSIS — Z7984 Long term (current) use of oral hypoglycemic drugs: Secondary | ICD-10-CM | POA: Diagnosis not present

## 2017-05-28 DIAGNOSIS — I10 Essential (primary) hypertension: Secondary | ICD-10-CM | POA: Insufficient documentation

## 2017-05-28 DIAGNOSIS — Z87891 Personal history of nicotine dependence: Secondary | ICD-10-CM | POA: Diagnosis not present

## 2017-05-28 HISTORY — DX: Disorder of thyroid, unspecified: E07.9

## 2017-05-28 MED ORDER — CYCLOBENZAPRINE HCL 5 MG PO TABS: 5.0000 mg | ORAL_TABLET | Freq: Three times a day (TID) | ORAL | 0 refills | Status: DC | PRN

## 2017-05-28 MED ORDER — IBUPROFEN 600 MG PO TABS: 600.0000 mg | ORAL_TABLET | Freq: Four times a day (QID) | ORAL | 0 refills | Status: DC | PRN

## 2017-05-28 MED ORDER — IBUPROFEN 800 MG PO TABS
800.0000 mg | ORAL_TABLET | Freq: Once | ORAL | Status: AC
  Administered 2017-05-28: 800 mg via ORAL
  Filled 2017-05-28: qty 1

## 2017-05-28 NOTE — Discharge Instructions (Signed)
Wear the sling as needed for few days, but do not wear it continuously.  Follow-up with one of the orthopedic providers listed next week if the pain is not improving

## 2017-05-28 NOTE — ED Notes (Signed)
Patient transported to X-ray 
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
## 2022-10-27 ENCOUNTER — Other Ambulatory Visit (HOSPITAL_COMMUNITY): Payer: Self-pay | Admitting: Nurse Practitioner

## 2022-10-27 DIAGNOSIS — R310 Gross hematuria: Secondary | ICD-10-CM

## 2022-11-12 ENCOUNTER — Telehealth: Payer: Self-pay

## 2022-11-12 NOTE — Telephone Encounter (Signed)
Return call to patient. Patient states that her kidney is hurting and wants an earlier appt and she thinks she has a kidney stone. Patient is aware that a task will be sent to the MD on recommendation and someone will reach back out. Patient voiced understanding

## 2022-11-12 NOTE — Telephone Encounter (Signed)
Patient needing to discuss a sooner appointment due to bleeding.   Please advise.  Call back:  [phone]

## 2022-12-01 ENCOUNTER — Ambulatory Visit: Payer: 59 | Admitting: Urology

## 2022-12-01 ENCOUNTER — Encounter: Payer: Self-pay | Admitting: Urology

## 2022-12-01 VITALS — BP 159/90 | HR 73

## 2022-12-01 DIAGNOSIS — R310 Gross hematuria: Secondary | ICD-10-CM

## 2022-12-01 DIAGNOSIS — N200 Calculus of kidney: Secondary | ICD-10-CM | POA: Diagnosis not present

## 2022-12-01 LAB — MICROSCOPIC EXAMINATION: Bacteria, UA: NONE SEEN

## 2022-12-01 LAB — URINALYSIS, ROUTINE W REFLEX MICROSCOPIC
Bilirubin, UA: NEGATIVE
Glucose, UA: NEGATIVE
Ketones, UA: NEGATIVE
Leukocytes,UA: NEGATIVE
Nitrite, UA: NEGATIVE
Protein,UA: NEGATIVE
Specific Gravity, UA: 1.01 (ref 1.005–1.030)
Urobilinogen, Ur: 0.2 mg/dL (ref 0.2–1.0)
pH, UA: 7 (ref 5.0–7.5)

## 2022-12-01 NOTE — Progress Notes (Unsigned)
12/01/2022 2:55 PM   Michel Santee [birth year] [id number]  Referring provider: Jonathon Bellows, DO [address] [place],  [place] [address]  Gross hematuria   HPI: Ms Claypool is a 62yo here for evaluation of gross hematuria and nephrolithiasis. She has been having intermittent gross hematuria for the past 2-3 months. She is a retired Scientist, product/process development. She has a remote hx of tobacco use. No significant LUTS   PMH: Past Medical History:  Diagnosis Date   Diabetes mellitus    Hypercholesteremia    Hypertension    Thyroid disease     Surgical History: Past Surgical History:  Procedure Laterality Date   ABDOMINAL HYSTERECTOMY     COLONOSCOPY N/A 06/04/2013   Procedure: COLONOSCOPY;  Surgeon: West Bali, MD;  Location: [facility];  Service: Endoscopy;  Laterality: N/A;  9:30 AM    Home Medications:  Allergies as of 12/01/2022   No Known Allergies      Medication List        Accurate as of December 01, 2022  2:55 PM. If you have any questions, ask your nurse or doctor.          atorvastatin 10 MG tablet Commonly known as: LIPITOR Take 10 mg by mouth daily.   cyclobenzaprine 5 MG tablet Commonly known as: FLEXERIL Take 1 tablet (5 mg total) by mouth 3 (three) times daily as needed.   glipiZIDE 10 MG tablet Commonly known as: GLUCOTROL Take 10 mg by mouth daily.   ibuprofen 600 MG tablet Commonly known as: ADVIL Take 1 tablet (600 mg total) by mouth every 6 (six) hours as needed. Take with food   levothyroxine 75 MCG tablet Commonly known as: SYNTHROID Take 75 mcg by mouth every morning.   lisinopril 20 MG tablet Commonly known as: ZESTRIL Take 20 mg by mouth daily. What changed: Another medication with the same name was removed. Continue taking this medication, and follow the directions you see here.   Multivitamin Tabs Take 1 tablet by mouth daily.   naproxen 500 MG tablet Commonly known as: NAPROSYN Take 500 mg by mouth 2 (two) times daily.    pioglitazone 15 MG tablet Commonly known as: ACTOS Take 15 mg by mouth daily.   QC TUMERIC COMPLEX PO Take by mouth.   sitaGLIPtin-metformin 50-1000 MG tablet Commonly known as: JANUMET Take 1 tablet by mouth 2 (two) times daily with a meal.   VITAMIN D PO Take 1 tablet by mouth daily.        Allergies: No Known Allergies  Family History: Family History  Problem Relation Age of Onset   Colon cancer Neg Hx     Social History:  reports that she has quit smoking. Her smoking use included cigarettes. She has a 1.50 pack-year smoking history. She does not have any smokeless tobacco history on file. She reports that she does not drink alcohol and does not use drugs.  ROS: All other review of systems were reviewed and are negative except what is noted above in HPI  Physical Exam: BP (!) 159/90   Pulse 73   LMP 09/06/2010   Constitutional:  Alert and oriented, No acute distress. HEENT: [place] AT, moist mucus membranes.  Trachea midline, no masses. Cardiovascular: No clubbing, cyanosis, or edema. Respiratory: Normal respiratory effort, no increased work of breathing. GI: Abdomen is soft, nontender, nondistended, no abdominal masses GU: No CVA tenderness.  Lymph: No cervical or inguinal lymphadenopathy. Skin: No rashes, bruises or suspicious lesions. Neurologic: Grossly  intact, no focal deficits, moving all 4 extremities. Psychiatric: Normal mood and affect.  Laboratory Data: Lab Results  Component Value Date   WBC 7.1 02/05/2011   HGB 12.5 02/05/2011   HCT 35.2 (L) 02/05/2011   MCV 92.6 02/05/2011   PLT 194 02/05/2011    Lab Results  Component Value Date   CREATININE 0.62 02/05/2011    No results found for: "PSA"  No results found for: "TESTOSTERONE"  No results found for: "HGBA1C"  Urinalysis    Component Value Date/Time   COLORURINE YELLOW 09/03/2011 2215   APPEARANCEUR CLEAR 09/03/2011 2215   LABSPEC 1.020 09/03/2011 2215   PHURINE 6.0 09/03/2011 2215    GLUCOSEU NEGATIVE 09/03/2011 2215   HGBUR TRACE (A) 09/03/2011 2215   BILIRUBINUR NEGATIVE 09/03/2011 2215   KETONESUR NEGATIVE 09/03/2011 2215   PROTEINUR NEGATIVE 09/03/2011 2215   UROBILINOGEN 0.2 09/03/2011 2215   NITRITE NEGATIVE 09/03/2011 2215   LEUKOCYTESUR NEGATIVE 09/03/2011 2215    Lab Results  Component Value Date   BACTERIA RARE 09/03/2011    Pertinent Imaging: *** Results for orders placed during the hospital encounter of 08/25/08  DG Abd 1 View  Narrative Clinical Data: Left flank pain.  The patient self removed a left ureteral stent earlier today. Recent left lithotripsy.  ABDOMEN - 1 VIEW 08/25/2008:  Comparison: Unenhanced CT abdomen pelvis 08/07/2008.  Findings: Tiny stone fragment projected over the expected location of the mid left kidney.  The calculi identified in the right kidney on the prior examination are no longer visualized.  No visible distal ureteral calculi on either side.  Small phleboliths low in the pelvis as noted on the prior CT.  Bilateral tubal ligation clips.  Large amount of stool throughout the colon from cecum to rectum.  No evidence of bowel obstruction or significant ileus.  IMPRESSION:  1.  Tiny stone fragment projected over the expected location of the left mid kidney.  No visible left ureteral calculi. 2.  No acute abdominal abnormality apart from probable constipation.  Provider: Samule Ohm  No results found for this or any previous visit.  No results found for this or any previous visit.  No results found for this or any previous visit.  No results found for this or any previous visit.  No valid procedures specified. No results found for this or any previous visit.  No results found for this or any previous visit.   Assessment & Plan:    1. Gross hematuria *** - Urinalysis, Routine w reflex microscopic  2. Nephrolithiasis ***   No follow-ups on file.  Wilkie Aye, MD  [facility]  [facility] [place]

## 2022-12-01 NOTE — Patient Instructions (Signed)

## 2022-12-02 LAB — CYTOLOGY, URINE

## 2022-12-02 LAB — BASIC METABOLIC PANEL
BUN/Creatinine Ratio: 21 (ref 12–28)
BUN: 18 mg/dL (ref 8–27)
CO2: 27 mmol/L (ref 20–29)
Calcium: 10.2 mg/dL (ref 8.7–10.3)
Chloride: 103 mmol/L (ref 96–106)
Creatinine, Ser: 0.87 mg/dL (ref 0.57–1.00)
Glucose: 79 mg/dL (ref 70–99)
Potassium: 4.8 mmol/L (ref 3.5–5.2)
Sodium: 145 mmol/L — ABNORMAL HIGH (ref 134–144)
eGFR: 75 mL/min/{1.73_m2} (ref >59)

## 2022-12-02 NOTE — Progress Notes (Signed)
Letter sent.

## 2022-12-07 ENCOUNTER — Other Ambulatory Visit: Payer: 59

## 2022-12-07 ENCOUNTER — Ambulatory Visit
Admission: RE | Admit: 2022-12-07 | Discharge: 2022-12-07 | Disposition: A | Discharge status: Home / Self Care | Payer: 59 | Source: Ambulatory Visit | Attending: Urology | Admitting: Urology

## 2022-12-07 MED ORDER — IOPAMIDOL (ISOVUE-370) INJECTION 76%
125.0000 mL | Freq: Once | INTRAVENOUS | Status: AC | PRN
  Administered 2022-12-07: 125 mL via INTRAVENOUS

## 2022-12-07 NOTE — Progress Notes (Signed)
Letter sent.

## 2022-12-08 ENCOUNTER — Telehealth: Payer: Self-pay

## 2022-12-08 NOTE — Telephone Encounter (Signed)
Patient called advising that she had her CT scan and wanted to know if she could be contacted with the results.    Thank you

## 2022-12-13 ENCOUNTER — Telehealth: Payer: Self-pay

## 2022-12-13 NOTE — Telephone Encounter (Signed)
Patient's daughter call regarding patient's CT. Daughter is aware that once Dr. Ronne Binning review over results someone will reach out to her with those results. Daughter voiced understanding

## 2022-12-20 NOTE — Telephone Encounter (Signed)
Patient left a voice message 12-16-2022.  Wanting to know CT Scan results.

## 2022-12-21 ENCOUNTER — Telehealth: Payer: Self-pay

## 2022-12-21 NOTE — Telephone Encounter (Signed)
Can you please review CT imaging.  Patient has called several times requesting results.  She is also asking if she needs to keep her Cysto apt on 05/29 based on CT.

## 2022-12-23 NOTE — Telephone Encounter (Signed)
Patient aware of MD response.  She will keep upcoming cysto on 05/29

## 2022-12-24 ENCOUNTER — Encounter (HOSPITAL_COMMUNITY): Payer: Self-pay

## 2022-12-24 ENCOUNTER — Ambulatory Visit (HOSPITAL_COMMUNITY): Payer: Self-pay

## 2023-01-19 ENCOUNTER — Ambulatory Visit: Payer: 59 | Admitting: Urology

## 2023-01-19 VITALS — BP 188/72 | HR 61

## 2023-01-19 DIAGNOSIS — N200 Calculus of kidney: Secondary | ICD-10-CM | POA: Diagnosis not present

## 2023-01-19 DIAGNOSIS — R310 Gross hematuria: Secondary | ICD-10-CM

## 2023-01-19 LAB — MICROSCOPIC EXAMINATION: Bacteria, UA: NONE SEEN

## 2023-01-19 LAB — URINALYSIS, ROUTINE W REFLEX MICROSCOPIC
Bilirubin, UA: NEGATIVE
Glucose, UA: NEGATIVE
Ketones, UA: NEGATIVE
Nitrite, UA: NEGATIVE
Protein,UA: NEGATIVE
Specific Gravity, UA: <1.005 — ABNORMAL LOW (ref 1.005–1.030)
Urobilinogen, Ur: 0.2 mg/dL (ref 0.2–1.0)
pH, UA: 6 (ref 5.0–7.5)

## 2023-01-19 MED ORDER — CIPROFLOXACIN HCL 500 MG PO TABS
500.0000 mg | ORAL_TABLET | Freq: Once | ORAL | Status: AC
  Administered 2023-01-19: 500 mg via ORAL

## 2023-01-19 NOTE — Progress Notes (Unsigned)
   01/19/23  CC: gross hematuria   HPI: Misty Cruz is a 61yo here for cystoscopy for gross hematuria. Ct shows bilateral renal calculi Blood pressure (!) 188/72, pulse 61, last menstrual period 09/06/2010. NED. A&Ox3.   No respiratory distress   Abd soft, NT, ND Normal external genitalia with patent urethral meatus  Cystoscopy Procedure Note  Patient identification was confirmed, informed consent was obtained, and patient was prepped using Betadine solution.  Lidocaine jelly was administered per urethral meatus.    Procedure: - Flexible cystoscope introduced, without any difficulty.   - Thorough search of the bladder revealed:    normal urethral meatus    normal urothelium    no stones    no ulcers     no tumors    no urethral polyps    no trabeculation  - Ureteral orifices were normal in position and appearance.  Post-Procedure: - Patient tolerated the procedure well  Assessment/ Plan: -We discussed the management of kidney stones. These options include observation, ureteroscopy, shockwave lithotripsy (ESWL) and percutaneous nephrolithotomy (PCNL). We discussed which options are relevant to the patient's stone(s). We discussed the natural history of kidney stones as well as the complications of untreated stones and the impact on quality of life without treatment as well as with each of the above listed treatments. We also discussed the efficacy of each treatment in its ability to clear the stone burden. With any of these management options I discussed the signs and symptoms of infection and the need for emergent treatment should these be experienced. For each option we discussed the ability of each procedure to clear the patient of their stone burden.   For observation I described the risks which include but are not limited to silent renal damage, life-threatening infection, need for emergent surgery, failure to pass stone and pain.   For ureteroscopy I described the risks  which include bleeding, infection, damage to contiguous structures, positioning injury, ureteral stricture, ureteral avulsion, ureteral injury, need for prolonged ureteral stent, inability to perform ureteroscopy, need for an interval procedure, inability to clear stone burden, stent discomfort/pain, heart attack, stroke, pulmonary embolus and the inherent risks with general anesthesia.   For shockwave lithotripsy I described the risks which include arrhythmia, kidney contusion, kidney hemorrhage, need for transfusion, pain, inability to adequately break up stone, inability to pass stone fragments, Steinstrasse, infection associated with obstructing stones, need for alternate surgical procedure, need for repeat shockwave lithotripsy, MI, CVA, PE and the inherent risks with anesthesia/conscious sedation.   For PCNL I described the risks including positioning injury, pneumothorax, hydrothorax, need for chest tube, inability to clear stone burden, renal laceration, arterial venous fistula or malformation, need for embolization of kidney, loss of kidney or renal function, need for repeat procedure, need for prolonged nephrostomy tube, ureteral avulsion, MI, CVA, PE and the inherent risks of general anesthesia.   - The patient would like to proceed with bilateral ureteroscopic stone extraction   No follow-ups on file.  Wilkie Aye, MD

## 2023-01-20 ENCOUNTER — Encounter: Payer: Self-pay | Admitting: Urology

## 2023-01-20 NOTE — Patient Instructions (Signed)

## 2023-01-26 ENCOUNTER — Telehealth: Payer: Self-pay

## 2023-01-26 NOTE — Telephone Encounter (Signed)
I spoke with Misty Cruz. We have discussed possible surgery dates and 03/03/2023 was agreed upon by all parties. Patient given information about surgery date, what to expect pre-operatively and post operatively.    We discussed that a pre-op nurse will be calling to set up the pre-op visit that will take place prior to surgery. Informed patient that our office will communicate any additional care to be provided after surgery.    Patients questions or concerns were discussed during our call. Advised to call our office should there be any additional information, questions or concerns that arise. Patient verbalized understanding.

## 2023-02-10 ENCOUNTER — Encounter: Payer: Self-pay | Admitting: Urology

## 2023-02-11 NOTE — Telephone Encounter (Signed)
FYI

## 2023-02-25 NOTE — Patient Instructions (Signed)
Misty Cruz  02/25/2023     @PREFPERIOPPHARMACY @   Your procedure is scheduled on  03/03/2023.   Report to [facility] at  1000  A.M.   Call this number if you have problems the morning of surgery:  [phone]  If you experience any cold or flu symptoms such as cough, fever, chills, shortness of breath, etc. between now and your scheduled surgery, please notify us at the above number.   Remember:  Do not eat or drink after midnight.        DO NOT take any medications for diabetes the morning of your procedure.     Take these medicines the morning of surgery with A SIP OF WATER                           zyrtec, levothyroxine.     Do not wear jewelry, make-up or nail polish, including gel polish,  artificial nails, or any other type of covering on natural nails (fingers and  toes).  Do not wear lotions, powders, or perfumes, or deodorant.  Do not shave 48 hours prior to surgery.  Men may shave face and neck.  Do not bring valuables to the hospital.  [facility] is not responsible for any belongings or valuables.  Contacts, dentures or bridgework may not be worn into surgery.  Leave your suitcase in the car.  After surgery it may be brought to your room.  For patients admitted to the hospital, discharge time will be determined by your treatment team.  Patients discharged the day of surgery will not be allowed to drive home and must have someone with them for 24 hours.    Special instructions:   DO NOT smoke tobacco or vape for 24 hours before your procedure.  Please read over the following fact sheets that you were given. Pain Booklet, Surgical Site Infection Prevention, Anesthesia Post-op Instructions, and Care and Recovery After Surgery      Ureteral Stent Implantation, Care After The following information offers guidance on how to care for yourself after your procedure. Your health care provider may also give you more specific instructions. If  you have problems or questions, contact your health care provider. What can I expect after the procedure? After the procedure, it is common to have: Nausea. Mild pain when you urinate. You may feel this pain in your lower back or lower abdomen. The pain should stop within a few minutes after you urinate. This pattern may last for up to 1 week. A small amount of blood in your urine for several days. Follow these instructions at home: Medicines Take over-the-counter and prescription medicines only as told by your health care provider. If you were prescribed antibiotics, take them as told by your health care provider. Do not stop using the antibiotic even if you start to feel better. If you were given a sedative during the procedure, it can affect you for several hours. Do not drive or operate machinery until your health care provider says that it is safe. Ask your health care provider if the medicine prescribed to you: Requires you to avoid driving or using machinery. Can cause constipation. You may need to take these actions to prevent or treat constipation: Take over-the-counter or prescription medicines. Eat foods that are high in fiber, such as beans, whole grains, and fresh fruits and vegetables. Limit foods that are high in fat and  processed sugars, such as fried or sweet foods. Activity Rest as told by your health care provider. Do not sit for a long time without moving. Get up to take short walks every 1-2 hours. This will improve blood flow and breathing. Ask for help if you feel weak or unsteady. Return to your normal activities as told by your health care provider. Ask your health care provider what activities are safe for you. General instructions  If you have a catheter: Follow instructions from your health care provider about taking care of your catheter and collection bag. Do not take baths, swim, or use a hot tub until your health care provider approves. Ask your health care  provider if you may take showers. You may only be allowed to take sponge baths. Drink enough fluid to keep your urine pale yellow. Do not use any products that contain nicotine or tobacco. These products include cigarettes, chewing tobacco, and vaping devices, such as e-cigarettes. These can delay healing after surgery. If you need help quitting, ask your health care provider. Keep all follow-up visits. Contact a health care provider if: You start passing blood clots, or you have more than a small amount of blood in your urine. You have pain that gets worse or does not get better with medicine, especially pain when you urinate. You have trouble urinating. You feel nauseous or you vomit again and again during a period of more than 2 days after the procedure. You have a fever. Get help right away if: You are passing blood clots that are 1 inch (2.5 cm) or larger in size. You are leaking urine (have incontinence), or you cannot urinate. The end of the stent comes out of your urethra. You have sudden, sharp, or severe pain in your abdomen or lower back. You have swelling or pain in your legs. You have trouble breathing. These symptoms may be an emergency. Get help right away. Call 911. Do not wait to see if the symptoms will go away. Do not drive yourself to the hospital. Summary After the procedure, it is common to have mild pain when you urinate that goes away within a few minutes after you urinate. This may last for up to 1 week. Take over-the-counter and prescription medicines only as told by your health care provider. Drink enough fluid to keep your urine pale yellow. Call your health care provider if you start passing blood clots, or you have more than a small amount of blood in your urine. This information is not intended to replace advice given to you by your health care provider. Make sure you discuss any questions you have with your health care provider. Document Revised: 09/14/2021  Document Reviewed: 09/14/2021 Elsevier Patient Education  2024 [employer] General Anesthesia, Adult, Care After The following information offers guidance on how to care for yourself after your procedure. Your health care provider may also give you more specific instructions. If you have problems or questions, contact your health care provider. What can I expect after the procedure? After the procedure, it is common for people to: Have pain or discomfort at the IV site. Have nausea or vomiting. Have a sore throat or hoarseness. Have trouble concentrating. Feel cold or chills. Feel weak, sleepy, or tired (fatigue). Have soreness and body aches. These can affect parts of the body that were not involved in surgery. Follow these instructions at home: For the time period you were told by your health care provider:  Rest. Do not participate in  activities where you could fall or become injured. Do not drive or use machinery. Do not drink alcohol. Do not take sleeping pills or medicines that cause drowsiness. Do not make important decisions or sign legal documents. Do not take care of children on your own. General instructions Drink enough fluid to keep your urine pale yellow. If you have sleep apnea, surgery and certain medicines can increase your risk for breathing problems. Follow instructions from your health care provider about wearing your sleep device: Anytime you are sleeping, including during daytime naps. While taking prescription pain medicines, sleeping medicines, or medicines that make you drowsy. Return to your normal activities as told by your health care provider. Ask your health care provider what activities are safe for you. Take over-the-counter and prescription medicines only as told by your health care provider. Do not use any products that contain nicotine or tobacco. These products include cigarettes, chewing tobacco, and vaping devices, such as e-cigarettes. These can  delay incision healing after surgery. If you need help quitting, ask your health care provider. Contact a health care provider if: You have nausea or vomiting that does not get better with medicine. You vomit every time you eat or drink. You have pain that does not get better with medicine. You cannot urinate or have bloody urine. You develop a skin rash. You have a fever. Get help right away if: You have trouble breathing. You have chest pain. You vomit blood. These symptoms may be an emergency. Get help right away. Call 911. Do not wait to see if the symptoms will go away. Do not drive yourself to the hospital. Summary After the procedure, it is common to have a sore throat, hoarseness, nausea, vomiting, or to feel weak, sleepy, or fatigue. For the time period you were told by your health care provider, do not drive or use machinery. Get help right away if you have difficulty breathing, have chest pain, or vomit blood. These symptoms may be an emergency. This information is not intended to replace advice given to you by your health care provider. Make sure you discuss any questions you have with your health care provider. Document Revised: 11/06/2021 Document Reviewed: 11/06/2021 Elsevier Patient Education  2024 [employer] How to Use Chlorhexidine Before Surgery Chlorhexidine gluconate (CHG) is a germ-killing (antiseptic) solution that is used to clean the skin. It can get rid of the bacteria that normally live on the skin and can keep them away for about 24 hours. To clean your skin with CHG, you may be given: A CHG solution to use in the shower or as part of a sponge bath. A prepackaged cloth that contains CHG. Cleaning your skin with CHG may help lower the risk for infection: While you are staying in the intensive care unit of the hospital. If you have a vascular access, such as a central line, to provide short-term or long-term access to your veins. If you have a catheter to  drain urine from your bladder. If you are on a ventilator. A ventilator is a machine that helps you breathe by moving air in and out of your lungs. After surgery. What are the risks? Risks of using CHG include: A skin reaction. Hearing loss, if CHG gets in your ears and you have a perforated eardrum. Eye injury, if CHG gets in your eyes and is not rinsed out. The CHG product catching fire. Make sure that you avoid smoking and flames after applying CHG to your skin. Do not use  CHG: If you have a chlorhexidine allergy or have previously reacted to chlorhexidine. On babies younger than 75 months of age. How to use CHG solution Use CHG only as told by your health care provider, and follow the instructions on the label. Use the full amount of CHG as directed. Usually, this is one bottle. During a shower Follow these steps when using CHG solution during a shower (unless your health care provider gives you different instructions): Start the shower. Use your normal soap and shampoo to wash your face and hair. Turn off the shower or move out of the shower stream. Pour the CHG onto a clean washcloth. Do not use any type of brush or rough-edged sponge. Starting at your neck, lather your body down to your toes. Make sure you follow these instructions: If you will be having surgery, pay special attention to the part of your body where you will be having surgery. Scrub this area for at least 1 minute. Do not use CHG on your head or face. If the solution gets into your ears or eyes, rinse them well with water. Avoid your genital area. Avoid any areas of skin that have broken skin, cuts, or scrapes. Scrub your back and under your arms. Make sure to wash skin folds. Let the lather sit on your skin for 1-2 minutes or as long as told by your health care provider. Thoroughly rinse your entire body in the shower. Make sure that all body creases and crevices are rinsed well. Dry off with a clean towel. Do not  put any substances on your body afterward--such as powder, lotion, or perfume--unless you are told to do so by your health care provider. Only use lotions that are recommended by the manufacturer. Put on clean clothes or pajamas. If it is the night before your surgery, sleep in clean sheets.  During a sponge bath Follow these steps when using CHG solution during a sponge bath (unless your health care provider gives you different instructions): Use your normal soap and shampoo to wash your face and hair. Pour the CHG onto a clean washcloth. Starting at your neck, lather your body down to your toes. Make sure you follow these instructions: If you will be having surgery, pay special attention to the part of your body where you will be having surgery. Scrub this area for at least 1 minute. Do not use CHG on your head or face. If the solution gets into your ears or eyes, rinse them well with water. Avoid your genital area. Avoid any areas of skin that have broken skin, cuts, or scrapes. Scrub your back and under your arms. Make sure to wash skin folds. Let the lather sit on your skin for 1-2 minutes or as long as told by your health care provider. Using a different clean, wet washcloth, thoroughly rinse your entire body. Make sure that all body creases and crevices are rinsed well. Dry off with a clean towel. Do not put any substances on your body afterward--such as powder, lotion, or perfume--unless you are told to do so by your health care provider. Only use lotions that are recommended by the manufacturer. Put on clean clothes or pajamas. If it is the night before your surgery, sleep in clean sheets. How to use CHG prepackaged cloths Only use CHG cloths as told by your health care provider, and follow the instructions on the label. Use the CHG cloth on clean, dry skin. Do not use the CHG cloth on your head  or face unless your health care provider tells you to. When washing with the CHG  cloth: Avoid your genital area. Avoid any areas of skin that have broken skin, cuts, or scrapes. Before surgery Follow these steps when using a CHG cloth to clean before surgery (unless your health care provider gives you different instructions): Using the CHG cloth, vigorously scrub the part of your body where you will be having surgery. Scrub using a back-and-forth motion for 3 minutes. The area on your body should be completely wet with CHG when you are done scrubbing. Do not rinse. Discard the cloth and let the area air-dry. Do not put any substances on the area afterward, such as powder, lotion, or perfume. Put on clean clothes or pajamas. If it is the night before your surgery, sleep in clean sheets.  For general bathing Follow these steps when using CHG cloths for general bathing (unless your health care provider gives you different instructions). Use a separate CHG cloth for each area of your body. Make sure you wash between any folds of skin and between your fingers and toes. Wash your body in the following order, switching to a new cloth after each step: The front of your neck, shoulders, and chest. Both of your arms, under your arms, and your hands. Your stomach and groin area, avoiding the genitals. Your right leg and foot. Your left leg and foot. The back of your neck, your back, and your buttocks. Do not rinse. Discard the cloth and let the area air-dry. Do not put any substances on your body afterward--such as powder, lotion, or perfume--unless you are told to do so by your health care provider. Only use lotions that are recommended by the manufacturer. Put on clean clothes or pajamas. Contact a health care provider if: Your skin gets irritated after scrubbing. You have questions about using your solution or cloth. You swallow any chlorhexidine. Call your local poison control center ([phone] in the [place] Get help right away if: Your eyes itch badly, or they become  very red or swollen. Your skin itches badly and is red or swollen. Your hearing changes. You have trouble seeing. You have swelling or tingling in your mouth or throat. You have trouble breathing. These symptoms may represent a serious problem that is an emergency. Do not wait to see if the symptoms will go away. Get medical help right away. Call your local emergency services (911 in the [place] Do not drive yourself to the hospital. Summary Chlorhexidine gluconate (CHG) is a germ-killing (antiseptic) solution that is used to clean the skin. Cleaning your skin with CHG may help to lower your risk for infection. You may be given CHG to use for bathing. It may be in a bottle or in a prepackaged cloth to use on your skin. Carefully follow your health care provider's instructions and the instructions on the product label. Do not use CHG if you have a chlorhexidine allergy. Contact your health care provider if your skin gets irritated after scrubbing. This information is not intended to replace advice given to you by your health care provider. Make sure you discuss any questions you have with your health care provider. Document Revised: 12/07/2021 Document Reviewed: 10/20/2020 Elsevier Patient Education  2023 [employer].

## 2023-02-28 ENCOUNTER — Encounter (HOSPITAL_COMMUNITY): Payer: Self-pay

## 2023-02-28 ENCOUNTER — Encounter (HOSPITAL_COMMUNITY)
Admission: RE | Admit: 2023-02-28 | Discharge: 2023-02-28 | Disposition: A | Discharge status: Home / Self Care | Payer: 59 | Source: Ambulatory Visit | Attending: Urology | Admitting: Urology

## 2023-02-28 VITALS — Ht 59.0 in | Wt 150.0 lb

## 2023-02-28 DIAGNOSIS — Z01818 Encounter for other preprocedural examination: Secondary | ICD-10-CM | POA: Insufficient documentation

## 2023-02-28 DIAGNOSIS — E119 Type 2 diabetes mellitus without complications: Secondary | ICD-10-CM | POA: Insufficient documentation

## 2023-02-28 LAB — BASIC METABOLIC PANEL
Anion gap: 8 (ref 5–15)
BUN: 22 mg/dL (ref 8–23)
CO2: 22 mmol/L (ref 22–32)
Calcium: 9.4 mg/dL (ref 8.9–10.3)
Chloride: 104 mmol/L (ref 98–111)
Creatinine, Ser: 0.66 mg/dL (ref 0.44–1.00)
GFR, Estimated: >60 mL/min (ref >60)
Glucose, Bld: 135 mg/dL — ABNORMAL HIGH (ref 70–99)
Potassium: 3.8 mmol/L (ref 3.5–5.1)
Sodium: 134 mmol/L — ABNORMAL LOW (ref 135–145)

## 2023-03-03 ENCOUNTER — Encounter (HOSPITAL_COMMUNITY): Payer: Self-pay | Admitting: Urology

## 2023-03-03 ENCOUNTER — Ambulatory Visit (HOSPITAL_BASED_OUTPATIENT_CLINIC_OR_DEPARTMENT_OTHER): Payer: 59 | Admitting: Anesthesiology

## 2023-03-03 ENCOUNTER — Ambulatory Visit (HOSPITAL_COMMUNITY): Payer: 59 | Admitting: Anesthesiology

## 2023-03-03 ENCOUNTER — Encounter (HOSPITAL_COMMUNITY)
Admission: RE | Disposition: A | Discharge status: Home / Self Care | Payer: Self-pay | Source: Home / Self Care | Attending: Urology

## 2023-03-03 ENCOUNTER — Telehealth: Payer: Self-pay

## 2023-03-03 ENCOUNTER — Ambulatory Visit (HOSPITAL_COMMUNITY)
Admission: RE | Admit: 2023-03-03 | Discharge: 2023-03-03 | Disposition: A | Discharge status: Home / Self Care | Payer: 59 | Attending: Urology | Admitting: Urology

## 2023-03-03 ENCOUNTER — Ambulatory Visit (HOSPITAL_COMMUNITY): Payer: 59

## 2023-03-03 DIAGNOSIS — I10 Essential (primary) hypertension: Secondary | ICD-10-CM | POA: Diagnosis not present

## 2023-03-03 DIAGNOSIS — Z09 Encounter for follow-up examination after completed treatment for conditions other than malignant neoplasm: Secondary | ICD-10-CM | POA: Diagnosis not present

## 2023-03-03 DIAGNOSIS — N200 Calculus of kidney: Secondary | ICD-10-CM

## 2023-03-03 DIAGNOSIS — Z7984 Long term (current) use of oral hypoglycemic drugs: Secondary | ICD-10-CM | POA: Insufficient documentation

## 2023-03-03 DIAGNOSIS — Z87891 Personal history of nicotine dependence: Secondary | ICD-10-CM | POA: Diagnosis not present

## 2023-03-03 DIAGNOSIS — E119 Type 2 diabetes mellitus without complications: Secondary | ICD-10-CM | POA: Diagnosis not present

## 2023-03-03 DIAGNOSIS — E039 Hypothyroidism, unspecified: Secondary | ICD-10-CM | POA: Insufficient documentation

## 2023-03-03 HISTORY — PX: STONE EXTRACTION WITH BASKET: SHX5318

## 2023-03-03 HISTORY — PX: CYSTOSCOPY WITH RETROGRADE PYELOGRAM, URETEROSCOPY AND STENT PLACEMENT: SHX5789

## 2023-03-03 HISTORY — PX: HOLMIUM LASER APPLICATION: SHX5852

## 2023-03-03 LAB — GLUCOSE, CAPILLARY
Glucose-Capillary: 161 mg/dL — ABNORMAL HIGH (ref 70–99)
Glucose-Capillary: 158 mg/dL — ABNORMAL HIGH (ref 70–99)

## 2023-03-03 SURGERY — CYSTOURETEROSCOPY, WITH RETROGRADE PYELOGRAM AND STENT INSERTION
Anesthesia: General | Site: Ureter | Laterality: Bilateral

## 2023-03-03 MED ORDER — FENTANYL CITRATE (PF) 100 MCG/2ML IJ SOLN
INTRAMUSCULAR | Status: AC
  Filled 2023-03-03: qty 2

## 2023-03-03 MED ORDER — MIDAZOLAM HCL 2 MG/2ML IJ SOLN
INTRAMUSCULAR | Status: AC
  Filled 2023-03-03: qty 2

## 2023-03-03 MED ORDER — DIATRIZOATE MEGLUMINE 30 % UR SOLN
URETHRAL | Status: DC | PRN
  Administered 2023-03-03: 10 mL via URETHRAL

## 2023-03-03 MED ORDER — DIATRIZOATE MEGLUMINE 30 % UR SOLN
URETHRAL | Status: AC
  Filled 2023-03-03: qty 100

## 2023-03-03 MED ORDER — PROPOFOL 10 MG/ML IV BOLUS
INTRAVENOUS | Status: DC | PRN
  Administered 2023-03-03: 150 mg via INTRAVENOUS

## 2023-03-03 MED ORDER — LACTATED RINGERS IV SOLN: INTRAVENOUS | Status: DC

## 2023-03-03 MED ORDER — FENTANYL CITRATE (PF) 100 MCG/2ML IJ SOLN
INTRAMUSCULAR | Status: DC | PRN
  Administered 2023-03-03: 25 ug via INTRAVENOUS
  Administered 2023-03-03: 50 ug via INTRAVENOUS
  Administered 2023-03-03: 25 ug via INTRAVENOUS

## 2023-03-03 MED ORDER — MIDAZOLAM HCL 5 MG/5ML IJ SOLN
INTRAMUSCULAR | Status: DC | PRN
  Administered 2023-03-03: 2 mg via INTRAVENOUS

## 2023-03-03 MED ORDER — LIDOCAINE HCL (CARDIAC) PF 50 MG/5ML IV SOSY
PREFILLED_SYRINGE | INTRAVENOUS | Status: DC | PRN
  Administered 2023-03-03: 100 mg via INTRAVENOUS

## 2023-03-03 MED ORDER — ONDANSETRON HCL 4 MG/2ML IJ SOLN
4.0000 mg | Freq: Once | INTRAMUSCULAR | Status: AC | PRN
  Administered 2023-03-03: 4 mg via INTRAVENOUS
  Filled 2023-03-03: qty 2

## 2023-03-03 MED ORDER — OXYCODONE-ACETAMINOPHEN 5-325 MG PO TABS: 1.0000 | ORAL_TABLET | ORAL | 0 refills | Status: DC | PRN

## 2023-03-03 MED ORDER — ONDANSETRON HCL 4 MG/2ML IJ SOLN
INTRAMUSCULAR | Status: AC
  Filled 2023-03-03: qty 4

## 2023-03-03 MED ORDER — SEVOFLURANE IN SOLN
RESPIRATORY_TRACT | Status: AC
  Filled 2023-03-03: qty 250

## 2023-03-03 MED ORDER — SODIUM CHLORIDE 0.9 % IR SOLN
Status: DC | PRN
  Administered 2023-03-03 (×2): 3000 mL

## 2023-03-03 MED ORDER — CEFAZOLIN SODIUM-DEXTROSE 2-4 GM/100ML-% IV SOLN
2.0000 g | INTRAVENOUS | Status: AC
  Administered 2023-03-03: 2 g via INTRAVENOUS

## 2023-03-03 MED ORDER — ONDANSETRON HCL 4 MG/2ML IJ SOLN
INTRAMUSCULAR | Status: DC | PRN
  Administered 2023-03-03: 4 mg via INTRAVENOUS

## 2023-03-03 MED ORDER — ONDANSETRON HCL 4 MG PO TABS: 4.0000 mg | ORAL_TABLET | Freq: Every day | ORAL | 1 refills | Status: DC | PRN

## 2023-03-03 MED ORDER — CHLORHEXIDINE GLUCONATE 0.12 % MT SOLN
15.0000 mL | Freq: Once | OROMUCOSAL | Status: AC
  Administered 2023-03-03: 15 mL via OROMUCOSAL

## 2023-03-03 MED ORDER — WATER FOR IRRIGATION, STERILE IR SOLN
Status: DC | PRN
  Administered 2023-03-03: 1000 mL

## 2023-03-03 MED ORDER — CEFAZOLIN SODIUM-DEXTROSE 2-4 GM/100ML-% IV SOLN
INTRAVENOUS | Status: AC
  Filled 2023-03-03: qty 100

## 2023-03-03 MED ORDER — HYDROMORPHONE HCL 1 MG/ML IJ SOLN
0.2500 mg | INTRAMUSCULAR | Status: DC | PRN
  Administered 2023-03-03: 0.5 mg via INTRAVENOUS
  Filled 2023-03-03: qty 0.5

## 2023-03-03 MED ORDER — ORAL CARE MOUTH RINSE: 15.0000 mL | Freq: Once | OROMUCOSAL | Status: AC

## 2023-03-03 SURGICAL SUPPLY — 26 items
BAG DRAIN URO TABLE W/ADPT NS (BAG) ×2 IMPLANT
BAG DRN 8 ADPR NS SKTRN CSTL (BAG) ×2
BAG HAMPER (MISCELLANEOUS) ×2 IMPLANT
CATH INTERMIT  6FR 70CM (CATHETERS) ×2 IMPLANT
CLOTH BEACON ORANGE TIMEOUT ST (SAFETY) ×2 IMPLANT
EXTRACTOR STONE NITINOL NGAGE (UROLOGICAL SUPPLIES) IMPLANT
GLOVE BIO SURGEON STRL SZ8 (GLOVE) ×2 IMPLANT
GLOVE BIOGEL PI IND STRL 7.0 (GLOVE) ×4 IMPLANT
GOWN STRL REUS W/TWL LRG LVL3 (GOWN DISPOSABLE) ×2 IMPLANT
GOWN STRL REUS W/TWL XL LVL3 (GOWN DISPOSABLE) ×2 IMPLANT
GUIDEWIRE STR DUAL SENSOR (WIRE) ×2 IMPLANT
GUIDEWIRE STR ZIPWIRE 035X150 (MISCELLANEOUS) ×2 IMPLANT
IV NS IRRIG 3000ML ARTHROMATIC (IV SOLUTION) ×4 IMPLANT
KIT TURNOVER CYSTO (KITS) ×2 IMPLANT
MANIFOLD NEPTUNE II (INSTRUMENTS) ×2 IMPLANT
PACK CYSTO (CUSTOM PROCEDURE TRAY) ×2 IMPLANT
PAD ARMBOARD 7.5X6 YLW CONV (MISCELLANEOUS) ×2 IMPLANT
POSITIONER HEAD 8X9X4 ADT (SOFTGOODS) ×2 IMPLANT
SHEATH URETERAL 12FRX35CM (MISCELLANEOUS) IMPLANT
STENT URET 6FRX24 CONTOUR (STENTS) IMPLANT
SYR 10ML LL (SYRINGE) ×2 IMPLANT
SYR CONTROL 10ML LL (SYRINGE) ×2 IMPLANT
TOWEL OR 17X26 4PK STRL BLUE (TOWEL DISPOSABLE) ×2 IMPLANT
TRACTIP FLEXIVA PULS ID 200XHI (Laser) IMPLANT
TRACTIP FLEXIVA PULSE ID 200 (Laser) ×2
WATER STERILE IRR 500ML POUR (IV SOLUTION) ×2 IMPLANT

## 2023-03-03 NOTE — Anesthesia Preprocedure Evaluation (Signed)
Anesthesia Evaluation  Patient identified by MRN, date of birth, ID band Patient awake    Reviewed: Allergy & Precautions, H&P , NPO status , Patient's Chart, lab work & pertinent test results  Airway Mallampati: II  TM Distance: >3 FB Neck ROM: Full    Dental  (+) Dental Advisory Given   Pulmonary neg pulmonary ROS, former smoker   Pulmonary exam normal breath sounds clear to auscultation       Cardiovascular hypertension, Pt. on medications Normal cardiovascular exam Rhythm:Regular Rate:Normal     Neuro/Psych negative neurological ROS  negative psych ROS   GI/Hepatic negative GI ROS, Neg liver ROS,,,  Endo/Other  diabetes, Well Controlled, Type 2, Oral Hypoglycemic AgentsHypothyroidism    Renal/GU negative Renal ROS  negative genitourinary   Musculoskeletal negative musculoskeletal ROS (+)    Abdominal   Peds negative pediatric ROS (+)  Hematology negative hematology ROS (+)   Anesthesia Other Findings   Reproductive/Obstetrics negative OB ROS                             Anesthesia Physical Anesthesia Plan  ASA: 2  Anesthesia Plan: General   Post-op Pain Management: Minimal or no pain anticipated   Induction: Intravenous  PONV Risk Score and Plan: 4 or greater and Ondansetron, Dexamethasone and Midazolam  Airway Management Planned: LMA and Oral ETT  Additional Equipment:   Intra-op Plan:   Post-operative Plan:   Informed Consent: I have reviewed the patients History and Physical, chart, labs and discussed the procedure including the risks, benefits and alternatives for the proposed anesthesia with the patient or authorized representative who has indicated his/her understanding and acceptance.     Dental advisory given  Plan Discussed with: CRNA and Surgeon  Anesthesia Plan Comments:        Anesthesia Quick Evaluation

## 2023-03-03 NOTE — Anesthesia Postprocedure Evaluation (Signed)
Anesthesia Post Note  Patient: Misty Cruz  Procedure(s) Performed: CYSTOSCOPY WITH RETROGRADE PYELOGRAM, URETEROSCOPY AND STENT PLACEMENT (Bilateral: Ureter) HOLMIUM LASER APPLICATION (Bilateral: Renal) STONE EXTRACTION WITH BASKET (Bilateral: Renal)  Patient location during evaluation: Phase II Anesthesia Type: General Level of consciousness: awake and alert and oriented Pain management: pain level controlled Vital Signs Assessment: post-procedure vital signs reviewed and stable Respiratory status: spontaneous breathing, nonlabored ventilation and respiratory function stable Cardiovascular status: blood pressure returned to baseline and stable Postop Assessment: no apparent nausea or vomiting Anesthetic complications: no  No notable events documented.   Last Vitals:  Vitals:   03/03/23 1400 03/03/23 1415  BP: (!) 145/70 (!) 142/67  Pulse: 73 69  Resp: 12 12  Temp:    SpO2: 97% 96%    Last Pain:  Vitals:   03/03/23 1415  TempSrc:   PainSc: 5                  Freya Zobrist C Constantin Hillery

## 2023-03-03 NOTE — Op Note (Signed)
Marland KitchenPreoperative diagnosis: bilateral renal calculi  Postoperative diagnosis: Same  Procedure: 1 cystoscopy 2. Bilateral retrograde pyelography 3.  Intraoperative fluoroscopy, under one hour, with interpretation 4.  Bilateral ureteroscopic stone manipulation with laser lithotripsy 5.  bilateral 6 x 26 JJ stent exchange  Attending: Cleda Mccreedy  Anesthesia: General  Estimated blood loss: None  Drains: bilateral 6 x 26 JJ ureteral stent without tether  Specimens: stone for analysis  Antibiotics: ancef  Findings: right mid and lower pole renal calculi. Left mid pole calculus. No hydronephrosis. No masses/lesions in the bladder. Ureteral orifices in normal anatomic location.  Indications: Patient is a 62 year old female with a history of bilateral renal calculi and bilateral flank pain. After discussing treatment options, they decided proceed with bilateral ureteroscopic stone manipulation.  Procedure in detail: The patient was brought to the operating room and a brief timeout was done to ensure correct patient, correct procedure, correct site.  General anesthesia was administered patient was placed in dorsal lithotomy position.  Her genitalia was then prepped and draped in usual sterile fashion.  A rigid 22 French cystoscope was passed in the urethra and the bladder.  Bladder was inspected free masses or lesions.  the ureteral orifices were in the normal orthotopic locations. a 6 french ureteral catheter was then instilled into the left ureteral orifice.  a gentle retrograde was obtained and findings noted above. We then advanced a zipwire through the catheter and up to the renal pelvis.  we then removed the cystoscope and cannulated the left ureteral orifice with a semirigid ureteroscope.  We located no stone in the ureter. We then placed a sensor wire up to the renal pelvis. We removed the scope and advanced a 12/14 x 35cm access sheath up to the renal pelvis. We then used the flexible  ureteroscope to perform nephroscopy. We located calculi in the mid pole which was fragmented using a 242nm laser fiber. The fragments were then removed with an NGage basket. Once the stone were removed we then removed the access sheath under direct vision and noted to injury to the ureter.  We then placed a 6 x 26 double-j ureteral stent over the original zip wire. We then removed the wire and good coil was noted in the the renal pelvis under fluoroscopy and the bladder under direct vision.  We then turned out attention to the right side.a 6 french ureteral catheter was then instilled into the right ureteral orifice.  a gentle retrograde was obtained and findings noted above We then advanced a zipwire through the catheter and up to the renal pelvis.  we then removed the cystoscope and cannulated the left ureteral orifice with a semirigid ureteroscope.  We located no stone in the ureter. We then placed a sensor wire up to the renal pelvis. We removed the scope and advanced a 12/14 x 35cm access sheath up to the renal pelvis. We then used the flexible ureteroscope to perform nephroscopy. We located calculi in the mid and lower poles which were fragmented with a 242nm laser fiber. The fragments were removed with an NGage basket. Once the stone were removed we then removed the access sheath under direct vision and noted to injury to the ureter. we then placed a 6 x 26 double-j ureteral stent over the original zip wire.  We then removed the wire and good coil was noted in the the renal pelvis under fluoroscopy and the bladder under direct vision.   the bladder was then drained and this concluded the procedure  which was well tolerated by patient.  Complications: None  Condition: Stable, extubated, transferred to PACU  Plan: Patient is to be discharged home as to follow-up in 1 week for stent removal

## 2023-03-03 NOTE — Transfer of Care (Signed)
Immediate Anesthesia Transfer of Care Note  Patient: Misty Cruz  Procedure(s) Performed: CYSTOSCOPY WITH RETROGRADE PYELOGRAM, URETEROSCOPY AND STENT PLACEMENT (Bilateral: Ureter) HOLMIUM LASER APPLICATION (Bilateral: Renal) STONE EXTRACTION WITH BASKET (Bilateral: Renal)  Patient Location: PACU  Anesthesia Type:General  Level of Consciousness: awake  Airway & Oxygen Therapy: Patient Spontanous Breathing  Post-op Assessment: Report given to RN and Post -op Vital signs reviewed and stable  Post vital signs: Reviewed  Last Vitals:  Vitals Value Taken Time  BP 148/68 03/03/23 1354  Temp    Pulse 76 03/03/23 1355  Resp 12 03/03/23 1355  SpO2 98 % 03/03/23 1355  Vitals shown include unfiled device data.  Last Pain:  Vitals:   03/03/23 1028  TempSrc: Oral  PainSc: 0-No pain         Complications: No notable events documented.

## 2023-03-03 NOTE — Telephone Encounter (Signed)
Covermymeds started Key [id number] Oxycodone pending

## 2023-03-03 NOTE — H&P (Signed)
HPI: Ms Tedesco is a 62yo here for bilateral ureteroscopic stone extraction. CT from 11/2022 shows bilateral renal calculi. She is having intermittent left flank pain. No worsening LUTS.     PMH:     Past Medical History:  Diagnosis Date   Diabetes mellitus     Hypercholesteremia     Hypertension     Thyroid disease            Surgical History:      Past Surgical History:  Procedure Laterality Date   ABDOMINAL HYSTERECTOMY       COLONOSCOPY N/A 06/04/2013    Procedure: COLONOSCOPY;  Surgeon: West Bali, MD;  Location: [facility];  Service: Endoscopy;  Laterality: N/A;  9:30 AM          Home Medications:  Allergies as of 12/01/2022   No Known Allergies         Medication List           Accurate as of December 01, 2022  2:55 PM. If you have any questions, ask your nurse or doctor.              atorvastatin 10 MG tablet Commonly known as: LIPITOR Take 10 mg by mouth daily.    cyclobenzaprine 5 MG tablet Commonly known as: FLEXERIL Take 1 tablet (5 mg total) by mouth 3 (three) times daily as needed.    glipiZIDE 10 MG tablet Commonly known as: GLUCOTROL Take 10 mg by mouth daily.    ibuprofen 600 MG tablet Commonly known as: ADVIL Take 1 tablet (600 mg total) by mouth every 6 (six) hours as needed. Take with food    levothyroxine 75 MCG tablet Commonly known as: SYNTHROID Take 75 mcg by mouth every morning.    lisinopril 20 MG tablet Commonly known as: ZESTRIL Take 20 mg by mouth daily. What changed: Another medication with the same name was removed. Continue taking this medication, and follow the directions you see here.    Multivitamin Tabs Take 1 tablet by mouth daily.    naproxen 500 MG tablet Commonly known as: NAPROSYN Take 500 mg by mouth 2 (two) times daily.    pioglitazone 15 MG tablet Commonly known as: ACTOS Take 15 mg by mouth daily.    QC TUMERIC COMPLEX PO Take by mouth.    sitaGLIPtin-metformin 50-1000 MG tablet Commonly  known as: JANUMET Take 1 tablet by mouth 2 (two) times daily with a meal.    VITAMIN D PO Take 1 tablet by mouth daily.             Allergies:  Allergies  No Known Allergies     Family History:      Family History  Problem Relation Age of Onset   Colon cancer Neg Hx            Social History:  reports that she has quit smoking. Her smoking use included cigarettes. She has a 1.50 pack-year smoking history. She does not have any smokeless tobacco history on file. She reports that she does not drink alcohol and does not use drugs.   ROS: All other review of systems were reviewed and are negative except what is noted above in HPI   Physical Exam: BP (!) 159/90   Pulse 73   LMP 09/06/2010   Constitutional:  Alert and oriented, No acute distress. HEENT: [place] AT, moist mucus membranes.  Trachea midline, no masses. Cardiovascular: No clubbing, cyanosis, or edema. Respiratory: Normal respiratory effort, no  increased work of breathing. GI: Abdomen is soft, nontender, nondistended, no abdominal masses GU: No CVA tenderness.  Lymph: No cervical or inguinal lymphadenopathy. Skin: No rashes, bruises or suspicious lesions. Neurologic: Grossly intact, no focal deficits, moving all 4 extremities. Psychiatric: Normal mood and affect.   Laboratory Data: Recent Labs       Lab Results  Component Value Date    WBC 7.1 02/05/2011    HGB 12.5 02/05/2011    HCT 35.2 (L) 02/05/2011    MCV 92.6 02/05/2011    PLT 194 02/05/2011        Recent Labs       Lab Results  Component Value Date    CREATININE 0.62 02/05/2011        Recent Labs  No results found for: "PSA"     Recent Labs  No results found for: "TESTOSTERONE"     Recent Labs  No results found for: "HGBA1C"     Urinalysis Labs (Brief)          Component Value Date/Time    COLORURINE YELLOW 09/03/2011 2215    APPEARANCEUR CLEAR 09/03/2011 2215    LABSPEC 1.020 09/03/2011 2215    PHURINE 6.0 09/03/2011 2215     GLUCOSEU NEGATIVE 09/03/2011 2215    HGBUR TRACE (A) 09/03/2011 2215    BILIRUBINUR NEGATIVE 09/03/2011 2215    KETONESUR NEGATIVE 09/03/2011 2215    PROTEINUR NEGATIVE 09/03/2011 2215    UROBILINOGEN 0.2 09/03/2011 2215    NITRITE NEGATIVE 09/03/2011 2215    LEUKOCYTESUR NEGATIVE 09/03/2011 2215        Recent Labs       Lab Results  Component Value Date    BACTERIA RARE 09/03/2011        Pertinent Imaging:   Results for orders placed during the hospital encounter of 08/25/08   DG Abd 1 View   Narrative Clinical Data: Left flank pain.  The patient self removed a left ureteral stent earlier today. Recent left lithotripsy.   ABDOMEN - 1 VIEW 08/25/2008:   Comparison: Unenhanced CT abdomen pelvis 08/07/2008.   Findings: Tiny stone fragment projected over the expected location of the mid left kidney.  The calculi identified in the right kidney on the prior examination are no longer visualized.  No visible distal ureteral calculi on either side.  Small phleboliths low in the pelvis as noted on the prior CT.  Bilateral tubal ligation clips.  Large amount of stool throughout the colon from cecum to rectum.  No evidence of bowel obstruction or significant ileus.   IMPRESSION:   1.  Tiny stone fragment projected over the expected location of the left mid kidney.  No visible left ureteral calculi. 2.  No acute abdominal abnormality apart from probable constipation.   Provider: Samule Ohm   No results found for this or any previous visit.   No results found for this or any previous visit.   No results found for this or any previous visit.   No results found for this or any previous visit.   No valid procedures specified. No results found for this or any previous visit.   No results found for this or any previous visit.     Assessment & Plan:     Bilateral renal calculi -We discussed the management of kidney stones. These options include observation,  ureteroscopy, shockwave lithotripsy (ESWL) and percutaneous nephrolithotomy (PCNL). We discussed which options are relevant to the patient's stone(s). We discussed the natural history of kidney stones  as well as the complications of untreated stones and the impact on quality of life without treatment as well as with each of the above listed treatments. We also discussed the efficacy of each treatment in its ability to clear the stone burden. With any of these management options I discussed the signs and symptoms of infection and the need for emergent treatment should these be experienced. For each option we discussed the ability of each procedure to clear the patient of their stone burden.   For observation I described the risks which include but are not limited to silent renal damage, life-threatening infection, need for emergent surgery, failure to pass stone and pain.   For ureteroscopy I described the risks which include bleeding, infection, damage to contiguous structures, positioning injury, ureteral stricture, ureteral avulsion, ureteral injury, need for prolonged ureteral stent, inability to perform ureteroscopy, need for an interval procedure, inability to clear stone burden, stent discomfort/pain, heart attack, stroke, pulmonary embolus and the inherent risks with general anesthesia.   For shockwave lithotripsy I described the risks which include arrhythmia, kidney contusion, kidney hemorrhage, need for transfusion, pain, inability to adequately break up stone, inability to pass stone fragments, Steinstrasse, infection associated with obstructing stones, need for alternate surgical procedure, need for repeat shockwave lithotripsy, MI, CVA, PE and the inherent risks with anesthesia/conscious sedation.   For PCNL I described the risks including positioning injury, pneumothorax, hydrothorax, need for chest tube, inability to clear stone burden, renal laceration, arterial venous fistula or  malformation, need for embolization of kidney, loss of kidney or renal function, need for repeat procedure, need for prolonged nephrostomy tube, ureteral avulsion, MI, CVA, PE and the inherent risks of general anesthesia.   - The patient would like to proceed with bilateral ureteroscopic stone extraction

## 2023-03-03 NOTE — Anesthesia Procedure Notes (Addendum)
Procedure Name: LMA Insertion Date/Time: 03/03/2023 12:47 PM  Performed by: Moshe Salisbury, CRNAPre-anesthesia Checklist: Patient identified, Patient being monitored, Emergency Drugs available, Timeout performed and Suction available Patient Re-evaluated:Patient Re-evaluated prior to induction Oxygen Delivery Method: Circle System Utilized Preoxygenation: Pre-oxygenation with 100% oxygen Induction Type: IV induction Ventilation: Mask ventilation without difficulty LMA: LMA inserted LMA Size: 3.0 Number of attempts: 1 Placement Confirmation: positive ETCO2 and breath sounds checked- equal and bilateral

## 2023-03-04 ENCOUNTER — Encounter (HOSPITAL_COMMUNITY): Payer: Self-pay | Admitting: Urology

## 2023-03-09 ENCOUNTER — Ambulatory Visit (INDEPENDENT_AMBULATORY_CARE_PROVIDER_SITE_OTHER): Payer: 59 | Admitting: Urology

## 2023-03-09 VITALS — BP 121/71 | HR 66

## 2023-03-09 DIAGNOSIS — N200 Calculus of kidney: Secondary | ICD-10-CM

## 2023-03-09 DIAGNOSIS — Z87442 Personal history of urinary calculi: Secondary | ICD-10-CM | POA: Diagnosis not present

## 2023-03-09 DIAGNOSIS — Z466 Encounter for fitting and adjustment of urinary device: Secondary | ICD-10-CM | POA: Diagnosis not present

## 2023-03-09 LAB — URINALYSIS, ROUTINE W REFLEX MICROSCOPIC
Bilirubin, UA: NEGATIVE
Glucose, UA: NEGATIVE
Ketones, UA: NEGATIVE
Nitrite, UA: NEGATIVE
Specific Gravity, UA: 1.01 (ref 1.005–1.030)
Urobilinogen, Ur: 0.2 mg/dL (ref 0.2–1.0)
pH, UA: 6 (ref 5.0–7.5)

## 2023-03-09 LAB — MICROSCOPIC EXAMINATION
Bacteria, UA: NONE SEEN
RBC, Urine: >30 /hpf — AB (ref 0–2)

## 2023-03-09 MED ORDER — CIPROFLOXACIN HCL 500 MG PO TABS
500.0000 mg | ORAL_TABLET | Freq: Once | ORAL | Status: AC
Start: 2023-03-09 — End: 2023-03-09
  Administered 2023-03-09: 500 mg via ORAL

## 2023-03-10 ENCOUNTER — Ambulatory Visit: Payer: 59 | Admitting: Urology

## 2023-03-12 LAB — CALCULI, WITH PHOTOGRAPH (CLINICAL LAB)
Calcium Oxalate Dihydrate: 30 %
Calcium Oxalate Monohydrate: 70 %
Weight Calculi: 70 mg

## 2023-03-14 ENCOUNTER — Ambulatory Visit: Payer: 59 | Admitting: Urology

## 2023-03-16 ENCOUNTER — Encounter: Payer: Self-pay | Admitting: Urology

## 2023-03-22 ENCOUNTER — Encounter: Payer: Self-pay | Admitting: Urology

## 2023-03-22 NOTE — Progress Notes (Signed)
   03/09/2023  CC: followup nephrolithiasis   HPI: [name] is a 62yo here for stent removal Blood pressure 121/71, pulse 66, last menstrual period 09/06/2010. NED. A&Ox3.   No respiratory distress   Abd soft, NT, ND Normal external genitalia with patent urethral meatus  Cystoscopy Procedure Note  Patient identification was confirmed, informed consent was obtained, and patient was prepped using Betadine solution.  Lidocaine jelly was administered per urethral meatus.    Procedure: - Flexible cystoscope introduced, without any difficulty.   - Thorough search of the bladder revealed:    normal urethral meatus    normal urothelium    no stones    no ulcers     no tumors    no urethral polyps    no trabeculation  - Ureteral orifices were normal in position and appearance. Using a grasper the bilateral ureteral stent were removed intact  Post-Procedure: - Patient tolerated the procedure well  Assessment/ Plan: Followup 6 weeks with a renal US   No follow-ups on file.  Wilkie Aye, MD

## 2023-03-22 NOTE — Patient Instructions (Signed)

## 2023-03-29 ENCOUNTER — Telehealth: Payer: Self-pay

## 2023-03-29 NOTE — Telephone Encounter (Signed)
Followed up on triage call. Patient states she does not know what happened but she is fine now. Voiced no complaints.

## 2023-04-20 ENCOUNTER — Ambulatory Visit (HOSPITAL_COMMUNITY): Payer: 59

## 2023-04-27 ENCOUNTER — Other Ambulatory Visit (INDEPENDENT_AMBULATORY_CARE_PROVIDER_SITE_OTHER): Payer: 59

## 2023-04-27 ENCOUNTER — Ambulatory Visit: Payer: 59 | Admitting: Urology

## 2023-04-27 ENCOUNTER — Encounter: Payer: Self-pay | Admitting: Orthopedic Surgery

## 2023-04-27 ENCOUNTER — Ambulatory Visit: Payer: 59 | Admitting: Orthopedic Surgery

## 2023-04-27 ENCOUNTER — Encounter: Payer: Self-pay | Admitting: Urology

## 2023-04-27 VITALS — BP 134/74 | HR 90

## 2023-04-27 VITALS — BP 145/87 | HR 75 | Ht 59.0 in | Wt 147.0 lb

## 2023-04-27 DIAGNOSIS — M25512 Pain in left shoulder: Secondary | ICD-10-CM

## 2023-04-27 DIAGNOSIS — M792 Neuralgia and neuritis, unspecified: Secondary | ICD-10-CM

## 2023-04-27 DIAGNOSIS — N200 Calculus of kidney: Secondary | ICD-10-CM

## 2023-04-27 LAB — URINALYSIS, ROUTINE W REFLEX MICROSCOPIC
Bilirubin, UA: NEGATIVE
Glucose, UA: NEGATIVE
Ketones, UA: NEGATIVE
Leukocytes,UA: NEGATIVE
Nitrite, UA: NEGATIVE
Protein,UA: NEGATIVE
RBC, UA: NEGATIVE
Specific Gravity, UA: <1.005 — ABNORMAL LOW (ref 1.005–1.030)
Urobilinogen, Ur: 0.2 mg/dL (ref 0.2–1.0)
pH, UA: 6 (ref 5.0–7.5)

## 2023-04-27 MED ORDER — PREDNISONE 10 MG (21) PO TBPK: ORAL_TABLET | ORAL | 0 refills | Status: DC

## 2023-04-27 MED ORDER — CYCLOBENZAPRINE HCL 10 MG PO TABS: 10.0000 mg | ORAL_TABLET | Freq: Two times a day (BID) | ORAL | 0 refills | Status: DC | PRN

## 2023-04-27 NOTE — Progress Notes (Signed)
04/27/2023 1:47 PM   Misty Cruz [birth year] [id number]  Referring provider: Jonathon Bellows, DO [address] [place],  [place] [address]  Followup nephrolithiasis   HPI: Misty Cruz is a 62yo here for followup for nephrolithiasis. No stone events since last visit. She denies nay flank pain. She did not get her renal US. She is interested in metabolic evaluation for her stones.    PMH: Past Medical History:  Diagnosis Date   Diabetes mellitus    Hypercholesteremia    Hypertension    Thyroid disease     Surgical History: Past Surgical History:  Procedure Laterality Date   ABDOMINAL HYSTERECTOMY     COLONOSCOPY N/A 06/04/2013   Procedure: COLONOSCOPY;  Surgeon: West Bali, MD;  Location: [facility];  Service: Endoscopy;  Laterality: N/A;  9:30 AM   CYSTOSCOPY WITH RETROGRADE PYELOGRAM, URETEROSCOPY AND STENT PLACEMENT Bilateral 03/03/2023   Procedure: CYSTOSCOPY WITH RETROGRADE PYELOGRAM, URETEROSCOPY AND STENT PLACEMENT;  Surgeon: Malen Gauze, MD;  Location: [facility];  Service: Urology;  Laterality: Bilateral;   HOLMIUM LASER APPLICATION Bilateral 03/03/2023   Procedure: HOLMIUM LASER APPLICATION;  Surgeon: Malen Gauze, MD;  Location: [facility];  Service: Urology;  Laterality: Bilateral;   STONE EXTRACTION WITH BASKET Bilateral 03/03/2023   Procedure: STONE EXTRACTION WITH BASKET;  Surgeon: Malen Gauze, MD;  Location: [facility];  Service: Urology;  Laterality: Bilateral;    Home Medications:  Allergies as of 04/27/2023   No Known Allergies      Medication List        Accurate as of April 27, 2023  1:47 PM. If you have any questions, ask your nurse or doctor.          atorvastatin 10 MG tablet Commonly known as: LIPITOR Take 10 mg by mouth in the morning.   BLACK ELDERBERRY PO Take 2,000 mg by mouth in the morning.   cetirizine 10 MG tablet Commonly known as: ZYRTEC Take 10 mg by mouth in the morning.   glipiZIDE 10 MG  tablet Commonly known as: GLUCOTROL Take 10 mg by mouth in the morning.   levothyroxine 75 MCG tablet Commonly known as: SYNTHROID Take 75 mcg by mouth every morning.   lisinopril 20 MG tablet Commonly known as: ZESTRIL Take 20 mg by mouth in the morning.   metFORMIN 500 MG tablet Commonly known as: GLUCOPHAGE Take 500 mg by mouth 2 (two) times daily.   Multivitamin Tabs Take 1 tablet by mouth in the morning.   naproxen 500 MG tablet Commonly known as: NAPROSYN Take 500 mg by mouth 2 (two) times daily.   ondansetron 4 MG tablet Commonly known as: Zofran Take 1 tablet (4 mg total) by mouth daily as needed for nausea or vomiting.   oxyCODONE-acetaminophen 5-325 MG tablet Commonly known as: Percocet Take 1 tablet by mouth every 4 (four) hours as needed for severe pain.   pioglitazone 15 MG tablet Commonly known as: ACTOS Take 15 mg by mouth in the morning.   VITAMIN D PO Take 1 tablet by mouth in the morning.        Allergies: No Known Allergies  Family History: Family History  Problem Relation Age of Onset   Colon cancer Neg Hx     Social History:  reports that she has quit smoking. Her smoking use included cigarettes. She has a 1.5 pack-year smoking history. She does not have any smokeless tobacco history on file. She reports that she does not drink alcohol  and does not use drugs.  ROS: All other review of systems were reviewed and are negative except what is noted above in HPI  Physical Exam: BP 134/74   Pulse 90   LMP 09/06/2010   Constitutional:  Alert and oriented, No acute distress. HEENT: [place] AT, moist mucus membranes.  Trachea midline, no masses. Cardiovascular: No clubbing, cyanosis, or edema. Respiratory: Normal respiratory effort, no increased work of breathing. GI: Abdomen is soft, nontender, nondistended, no abdominal masses GU: No CVA tenderness.  Lymph: No cervical or inguinal lymphadenopathy. Skin: No rashes, bruises or suspicious  lesions. Neurologic: Grossly intact, no focal deficits, moving all 4 extremities. Psychiatric: Normal mood and affect.  Laboratory Data: Lab Results  Component Value Date   WBC 7.1 02/05/2011   HGB 12.5 02/05/2011   HCT 35.2 (L) 02/05/2011   MCV 92.6 02/05/2011   PLT 194 02/05/2011    Lab Results  Component Value Date   CREATININE 0.66 02/28/2023    No results found for: "PSA"  No results found for: "TESTOSTERONE"  No results found for: "HGBA1C"  Urinalysis    Component Value Date/Time   COLORURINE YELLOW 09/03/2011 2215   APPEARANCEUR Cloudy (A) 03/09/2023 1349   LABSPEC 1.020 09/03/2011 2215   PHURINE 6.0 09/03/2011 2215   GLUCOSEU Negative 03/09/2023 1349   HGBUR TRACE (A) 09/03/2011 2215   BILIRUBINUR Negative 03/09/2023 1349   KETONESUR NEGATIVE 09/03/2011 2215   PROTEINUR 2+ (A) 03/09/2023 1349   PROTEINUR NEGATIVE 09/03/2011 2215   UROBILINOGEN 0.2 09/03/2011 2215   NITRITE Negative 03/09/2023 1349   NITRITE NEGATIVE 09/03/2011 2215   LEUKOCYTESUR 2+ (A) 03/09/2023 1349    Lab Results  Component Value Date   LABMICR See below: 03/09/2023   WBCUA 6-10 (A) 03/09/2023   LABEPIT 0-10 03/09/2023   BACTERIA None seen 03/09/2023    Pertinent Imaging:  Results for orders placed during the hospital encounter of 08/25/08  DG Abd 1 View  Narrative Clinical Data: Left flank pain.  The patient self removed a left ureteral stent earlier today. Recent left lithotripsy.  ABDOMEN - 1 VIEW 08/25/2008:  Comparison: Unenhanced CT abdomen pelvis 08/07/2008.  Findings: Tiny stone fragment projected over the expected location of the mid left kidney.  The calculi identified in the right kidney on the prior examination are no longer visualized.  No visible distal ureteral calculi on either side.  Small phleboliths low in the pelvis as noted on the prior CT.  Bilateral tubal ligation clips.  Large amount of stool throughout the colon from cecum to rectum.  No  evidence of bowel obstruction or significant ileus.  IMPRESSION:  1.  Tiny stone fragment projected over the expected location of the left mid kidney.  No visible left ureteral calculi. 2.  No acute abdominal abnormality apart from probable constipation.  Provider: Samule Ohm  No results found for this or any previous visit.  No results found for this or any previous visit.  No results found for this or any previous visit.  No results found for this or any previous visit.  No valid procedures specified. Results for orders placed in visit on 12/01/22  CT HEMATURIA WORKUP  Narrative CLINICAL DATA:  Gross hematuria, left flank pain  EXAM: CT ABDOMEN AND PELVIS WITHOUT AND WITH CONTRAST  TECHNIQUE: Multidetector CT imaging of the abdomen and pelvis was performed following the standard protocol before and following the bolus administration of intravenous contrast.  RADIATION DOSE REDUCTION: This exam was performed according to the departmental  dose-optimization program which includes automated exposure control, adjustment of the mA and/or kV according to patient size and/or use of iterative reconstruction technique.  CONTRAST:  125 mL Isovue-300 iodinated contrast IV  COMPARISON:  09/04/2011  FINDINGS: Lower chest: No acute abnormality.  Coronary artery calcifications.  Hepatobiliary: No focal liver abnormality is seen. Status post cholecystectomy. No biliary dilatation.  Pancreas: Unremarkable. No pancreatic ductal dilatation or surrounding inflammatory changes.  Spleen: Normal in size without significant abnormality.  Adrenals/Urinary Tract: Discoid left adrenal gland. Normal right adrenal gland. Small bilateral nonobstructive renal calculi. 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 Normal size and location of the right kidney. No ureteral calculi or hydronephrosis. Limited opacification of the right  ureter. Within this limitation, no evidence of urinary tract filling defect on delayed phase imaging. Bladder is unremarkable.  Stomach/Bowel: Stomach is within normal limits. Appendix not clearly visualized. No evidence of bowel wall thickening, distention, or inflammatory changes. Moderate burden of stool throughout the colon.  Vascular/Lymphatic: Aortic atherosclerosis. Varices in the left upper quadrant (series 10, image 30). No enlarged abdominal or pelvic lymph nodes.  Reproductive: No mass or other significant abnormality. The uterus is present despite stated history of hysterectomy.  Other: No abdominal wall hernia or abnormality. No ascites. 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  Musculoskeletal: No acute or significant osseous findings.  IMPRESSION: 1. Small bilateral nonobstructive renal calculi. No ureteral calculi or hydronephrosis. 2. 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 3. Normal size and location of the right kidney. 4. Limited opacification of the right ureter. Within this limitation, no evidence of urinary tract filling defect on delayed phase imaging. 5. 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  Aortic Atherosclerosis (ICD10-I70.0).   Electronically Signed By: Jearld Lesch M.D. On: 12/07/2022 17:12  No results found for this or any previous visit.   Assessment & Plan:    1. Nephrolithiasis -Renal US -24 hour urine -If renal US is normal I will see her back in 6 months with renal US - Urinalysis, Routine w reflex microscopic   No follow-ups on file.  Wilkie Aye, MD  [facility] [place]

## 2023-04-27 NOTE — Progress Notes (Signed)
New Patient Visit  Assessment: Misty Cruz is a 62 y.o. female with the following: 1. Radicular pain in left arm  Plan: Misty Cruz has pain in the left side of her neck, radiating pain to the left shoulder.  She has good range of motion of the left shoulder.  She has good strength.  No numbness or tingling.  She has spasm within the left trapezius muscle.  I believe it is the muscle spasm that is contributing to her pain.  She also may have some irritation of the rotator cuff tendons.  I have recommended a prednisone Dosepak to help with the pain, as well as a muscle relaxer to provide some relief of the spasm, and help with sleep at night.  If she continues to have issues, or the pain in the shoulder intensifies, we can consider an injection.  She states understanding.  She will follow-up as needed.  Follow-up: Return if symptoms worsen or fail to improve.  Subjective:  Chief Complaint  Patient presents with   Neck Pain    Pain radiating down the L arm to the elbow. 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     History of Present Illness: Misty Cruz is a 62 y.o. female who has been referred by Tomasita Crumble, FNP for evaluation of left shoulder pain.  She states that she has had pain in the superior aspect the left shoulder, with radiating pains distally to her elbow for the past month.  No specific injury.  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  She has taken ibuprofen once in a while, but this does not help.  She denies numbness and tingling.  She has not worked with physical therapy.  No muscle relaxers.  No injections.  She is also having pain in the left side of her lower back, which appears to be affecting her gait.  Originally, her provider sent her to see [place] for right knee pain.  The patient states that she has never had pain in the right knee.   Review of Systems: No fevers or chills No numbness or tingling No  chest pain No shortness of breath No bowel or bladder dysfunction No GI distress No headaches   Medical History:  Past Medical History:  Diagnosis Date   Diabetes mellitus    Hypercholesteremia    Hypertension    Thyroid disease     Past Surgical History:  Procedure Laterality Date   ABDOMINAL HYSTERECTOMY     COLONOSCOPY N/A 06/04/2013   Procedure: COLONOSCOPY;  Surgeon: West Bali, MD;  [facility]: [facility];  Service: Endoscopy;  Laterality: N/A;  9:30 AM   CYSTOSCOPY WITH RETROGRADE PYELOGRAM, URETEROSCOPY AND STENT PLACEMENT Bilateral 03/03/2023   Procedure: CYSTOSCOPY WITH RETROGRADE PYELOGRAM, URETEROSCOPY AND STENT PLACEMENT;  Surgeon: Malen Gauze, MD;  Location: [facility];  Service: Urology;  Laterality: Bilateral;   HOLMIUM LASER APPLICATION Bilateral 03/03/2023   Procedure: HOLMIUM LASER APPLICATION;  Surgeon: Malen Gauze, MD;  Location: [facility];  Service: Urology;  Laterality: Bilateral;   STONE EXTRACTION WITH BASKET Bilateral 03/03/2023   Procedure: STONE EXTRACTION WITH BASKET;  Surgeon: Malen Gauze, MD;  Location: [facility];  Service: Urology;  Laterality: Bilateral;    Family History  Problem Relation Age of Onset   Colon cancer Neg Hx    Social History   Tobacco Use   Smoking status: Former  Current packs/day: 0.50    Average packs/day: 0.5 packs/day for 3.0 years (1.5 ttl pk-yrs)    Types: Cigarettes  Substance Use Topics   Alcohol use: No   Drug use: No    No Known Allergies  Current Meds  Medication Sig   atorvastatin (LIPITOR) 10 MG tablet Take 10 mg by mouth in the morning.   BLACK ELDERBERRY PO Take 2,000 mg by mouth in the morning.   cetirizine (ZYRTEC) 10 MG tablet Take 10 mg by mouth in the morning.   Cholecalciferol (VITAMIN D PO) Take 1 tablet by mouth in the morning.   cyclobenzaprine (FLEXERIL) 10 MG tablet Take 1 tablet (10 mg total) by mouth 2 (two) times daily as needed.   glipiZIDE (GLUCOTROL) 10 MG tablet  Take 10 mg by mouth in the morning.   levothyroxine (SYNTHROID) 75 MCG tablet Take 75 mcg by mouth every morning.   lisinopril (ZESTRIL) 20 MG tablet Take 20 mg by mouth in the morning.   metFORMIN (GLUCOPHAGE) 500 MG tablet Take 500 mg by mouth 2 (two) times daily.   Multiple Vitamin (MULTIVITAMIN) TABS Take 1 tablet by mouth in the morning.   naproxen (NAPROSYN) 500 MG tablet Take 500 mg by mouth 2 (two) times daily.   pioglitazone (ACTOS) 15 MG tablet Take 15 mg by mouth in the morning.   predniSONE (STERAPRED UNI-PAK 21 TAB) 10 MG (21) TBPK tablet 10 mg DS 12 as directed    Objective: BP (!) 145/87   Pulse 75   Ht 4\' 11"  (1.499 m)   Wt 147 lb (66.7 kg)   LMP 09/06/2010   BMI 29.69 kg/m   Physical Exam:  General: Alert and oriented. and No acute distress. Gait: Left sided antalgic gait.  Evaluation of the left shoulder demonstrates no deformity.  She has good range of motion of the left shoulder.  Spasm within the left trapezius muscle.  Fingers are warm and well-perfused.  Good strength in the shoulder testing.  Good grip strength.  Sensation intact throughout the left upper extremity.  No tenderness to palpation of the lateral condyle.  IMAGING: I personally ordered and reviewed the following images  Cervical spine x-rays were obtained in clinic today.  No acute injuries are noted.  Loss of normal curvature, with straight cervical spine.  Degenerative changes at multiple levels, with some anterior osteophytes.  No anterolisthesis.  No bony lesions.  Impression: Cervical spine x-ray with degenerative changes   New Medications:  Meds ordered this encounter  Medications   predniSONE (STERAPRED UNI-PAK 21 TAB) 10 MG (21) TBPK tablet    Sig: 10 mg DS 12 as directed    Dispense:  48 tablet    Refill:  0   cyclobenzaprine (FLEXERIL) 10 MG tablet    Sig: Take 1 tablet (10 mg total) by mouth 2 (two) times daily as needed.    Dispense:  20 tablet    Refill:  0      Oliver Barre, MD  04/27/2023 3:46 PM

## 2023-04-27 NOTE — Patient Instructions (Signed)

## 2023-04-29 LAB — BASIC METABOLIC PANEL
BUN/Creatinine Ratio: 40 — ABNORMAL HIGH (ref 12–28)
BUN: 25 mg/dL (ref 8–27)
CO2: 23 mmol/L (ref 20–29)
Calcium: 10.2 mg/dL (ref 8.7–10.3)
Chloride: 100 mmol/L (ref 96–106)
Creatinine, Ser: 0.62 mg/dL (ref 0.57–1.00)
Glucose: 188 mg/dL — ABNORMAL HIGH (ref 70–99)
Potassium: 4.8 mmol/L (ref 3.5–5.2)
Sodium: 141 mmol/L (ref 134–144)
eGFR: 99 mL/min/{1.73_m2} (ref >59)

## 2023-04-29 LAB — PTH, INTACT AND CALCIUM: PTH: 17 pg/mL (ref 15–65)

## 2023-04-29 LAB — URIC ACID: Uric Acid: 5.3 mg/dL (ref 3.0–7.2)

## 2023-05-03 ENCOUNTER — Encounter: Payer: Self-pay | Admitting: Urology

## 2023-05-04 ENCOUNTER — Encounter: Payer: Self-pay | Admitting: *Deleted

## 2023-05-07 ENCOUNTER — Other Ambulatory Visit: Payer: Self-pay | Admitting: Urology

## 2023-05-14 LAB — LITHOLINK 24HR URINE PANEL
Ammonium, Urine: 37 mmol/24 hr (ref 15–60)
Calcium Oxalate Saturation: 4.48 — ABNORMAL LOW (ref 6.00–10.00)
Calcium Phosphate Saturation: 0.37 — ABNORMAL LOW (ref 0.50–2.00)
Calcium, Urine: 107 mg/24 hr (ref <200)
Calcium/Creatinine Ratio: 132 mg/g creat (ref 51–262)
Calcium/Kg Body Weight: 1.6 mg/24 hr/kg (ref <4.0)
Chloride, Urine: 177 mmol/24 hr (ref 70–250)
Citrate, Urine: <50 mg/24 hr — ABNORMAL LOW (ref >550)
Creatinine, Urine: 814 mg/24 hr
Creatinine/Kg Body Weight: 12 mg/24 hr/kg (ref 8.7–20.3)
Cystine, Urine, Qualitative: NEGATIVE
Magnesium, Urine: 42 mg/24 hr (ref 30–120)
Oxalate, Urine: 75 mg/24 hr — ABNORMAL HIGH (ref 20–40)
Phosphorus, Urine: 973 mg/24 hr (ref 600–1200)
Potassium, Urine: 59 mmol/24 hr (ref 20–100)
Protein Catabolic Rate: 1.6 g/kg/24 hr — ABNORMAL HIGH (ref 0.8–1.4)
Sodium, Urine: 189 mmol/24 hr — ABNORMAL HIGH (ref 50–150)
Sulfate, Urine: 52 meq/24 hr (ref 20–80)
Urea Nitrogen, Urine: 15.52 g/24 hr — ABNORMAL HIGH (ref 6.00–14.00)
Uric Acid Saturation: 0.23 (ref <1.00)
Uric Acid, Urine: 739 mg/24 hr (ref <750)
Urine Volume (Preserved): 3330 mL/24 hr (ref 500–4000)
pH, 24 hr, Urine: 6.354 — ABNORMAL HIGH (ref 5.800–6.200)

## 2023-05-24 ENCOUNTER — Telehealth: Payer: Self-pay

## 2023-05-24 NOTE — Telephone Encounter (Signed)
Patient is made aware once provider review results, someone will call her with provider response and recommendation. Patient voiced understanding.

## 2023-05-24 NOTE — Telephone Encounter (Signed)
Patient left a voice message 05-24-2023.  Calling to discuss culture results.  Please advise.  Call:  [phone]

## 2023-06-07 ENCOUNTER — Other Ambulatory Visit (HOSPITAL_COMMUNITY): Payer: Self-pay | Admitting: Nurse Practitioner

## 2023-06-07 DIAGNOSIS — Z1231 Encounter for screening mammogram for malignant neoplasm of breast: Secondary | ICD-10-CM

## 2023-06-13 ENCOUNTER — Encounter (HOSPITAL_COMMUNITY): Payer: Self-pay

## 2023-06-13 ENCOUNTER — Ambulatory Visit (HOSPITAL_COMMUNITY)
Admission: RE | Admit: 2023-06-13 | Discharge: 2023-06-13 | Disposition: A | Discharge status: Home / Self Care | Payer: 59 | Source: Ambulatory Visit | Attending: Nurse Practitioner | Admitting: Nurse Practitioner

## 2023-06-13 DIAGNOSIS — Z1231 Encounter for screening mammogram for malignant neoplasm of breast: Secondary | ICD-10-CM | POA: Insufficient documentation

## 2023-06-15 ENCOUNTER — Inpatient Hospital Stay
Admission: RE | Admit: 2023-06-15 | Discharge: 2023-06-15 | Disposition: A | Discharge status: Home / Self Care | Payer: Self-pay | Source: Ambulatory Visit | Attending: Nurse Practitioner | Admitting: Nurse Practitioner

## 2023-06-15 ENCOUNTER — Other Ambulatory Visit (HOSPITAL_COMMUNITY): Payer: Self-pay | Admitting: Nurse Practitioner

## 2023-06-15 DIAGNOSIS — Z1231 Encounter for screening mammogram for malignant neoplasm of breast: Secondary | ICD-10-CM

## 2023-06-20 ENCOUNTER — Other Ambulatory Visit (HOSPITAL_COMMUNITY): Payer: Self-pay | Admitting: Nurse Practitioner

## 2023-06-20 DIAGNOSIS — R928 Other abnormal and inconclusive findings on diagnostic imaging of breast: Secondary | ICD-10-CM

## 2023-07-07 ENCOUNTER — Ambulatory Visit (HOSPITAL_COMMUNITY)
Admission: RE | Admit: 2023-07-07 | Discharge: 2023-07-07 | Disposition: A | Discharge status: Home / Self Care | Payer: 59 | Source: Ambulatory Visit | Attending: Nurse Practitioner | Admitting: Nurse Practitioner

## 2023-07-07 ENCOUNTER — Telehealth: Payer: Self-pay | Admitting: Urology

## 2023-07-07 DIAGNOSIS — R928 Other abnormal and inconclusive findings on diagnostic imaging of breast: Secondary | ICD-10-CM | POA: Insufficient documentation

## 2023-07-07 NOTE — Telephone Encounter (Signed)
Patient is calling about her litholink from September she never received the results from the test. Please call

## 2023-07-07 NOTE — Telephone Encounter (Signed)
Please review results.

## 2023-07-08 ENCOUNTER — Other Ambulatory Visit (HOSPITAL_COMMUNITY): Payer: Self-pay | Admitting: Nurse Practitioner

## 2023-07-08 DIAGNOSIS — R928 Other abnormal and inconclusive findings on diagnostic imaging of breast: Secondary | ICD-10-CM

## 2023-07-08 DIAGNOSIS — R921 Mammographic calcification found on diagnostic imaging of breast: Secondary | ICD-10-CM

## 2023-07-12 ENCOUNTER — Telehealth: Payer: Self-pay

## 2023-07-12 ENCOUNTER — Encounter (HOSPITAL_COMMUNITY): Payer: Self-pay | Admitting: Nurse Practitioner

## 2023-07-12 NOTE — Telephone Encounter (Signed)
[facility] Faxed over a medical-surgical clearance to be completed for patients Nov. 25th biopsy.  Asking for the form to be completed and faxed to them as soon as possible.

## 2023-07-13 NOTE — Telephone Encounter (Signed)
I don't see the fax in her chart yet, will you please print it and give it to a clinical staff to address asap.  Thank you!

## 2023-07-14 ENCOUNTER — Ambulatory Visit
Admission: RE | Admit: 2023-07-14 | Discharge: 2023-07-14 | Disposition: A | Discharge status: Home / Self Care | Payer: 59 | Source: Ambulatory Visit | Attending: Nurse Practitioner | Admitting: Nurse Practitioner

## 2023-07-14 ENCOUNTER — Telehealth: Payer: Self-pay

## 2023-07-14 DIAGNOSIS — R928 Other abnormal and inconclusive findings on diagnostic imaging of breast: Secondary | ICD-10-CM

## 2023-07-14 DIAGNOSIS — R921 Mammographic calcification found on diagnostic imaging of breast: Secondary | ICD-10-CM

## 2023-07-14 HISTORY — PX: BREAST BIOPSY: SHX20

## 2023-07-14 NOTE — Telephone Encounter (Signed)
Left a voice message 07-14-23.  Needing culture results.  Please advise as soon as possible.  Call:  [phone]

## 2023-07-15 ENCOUNTER — Other Ambulatory Visit: Payer: Self-pay

## 2023-07-15 ENCOUNTER — Other Ambulatory Visit: Payer: Self-pay | Admitting: Urology

## 2023-07-15 DIAGNOSIS — N200 Calculus of kidney: Secondary | ICD-10-CM

## 2023-07-15 LAB — SURGICAL PATHOLOGY

## 2023-07-15 MED ORDER — POTASSIUM CITRATE ER 15 MEQ (1620 MG) PO TBCR: 1.0000 | EXTENDED_RELEASE_TABLET | Freq: Two times a day (BID) | ORAL | 11 refills | Status: DC

## 2023-07-15 NOTE — Telephone Encounter (Signed)
Please review 24 hour urine

## 2023-07-15 NOTE — Telephone Encounter (Signed)
Dr. Ronne Binning reviewed patient's 24 hour urine and sent in Potassium citrate for pt to take BID.  He also requested pt be scheduled in 2 weeks for BMP labs.  Patient aware of MD response and is scheduled on 12/11.  Appt reminder mailed to patient.

## 2023-08-03 ENCOUNTER — Other Ambulatory Visit: Payer: 59

## 2023-08-04 LAB — BASIC METABOLIC PANEL
BUN/Creatinine Ratio: 28 (ref 12–28)
BUN: 21 mg/dL (ref 8–27)
CO2: 27 mmol/L (ref 20–29)
Calcium: 9.5 mg/dL (ref 8.7–10.3)
Chloride: 102 mmol/L (ref 96–106)
Creatinine, Ser: 0.75 mg/dL (ref 0.57–1.00)
Glucose: 196 mg/dL — ABNORMAL HIGH (ref 70–99)
Potassium: 4.6 mmol/L (ref 3.5–5.2)
Sodium: 141 mmol/L (ref 134–144)
eGFR: 89 mL/min/{1.73_m2} (ref >59)

## 2023-10-26 ENCOUNTER — Ambulatory Visit (HOSPITAL_COMMUNITY)
Admission: RE | Admit: 2023-10-26 | Discharge: 2023-10-26 | Disposition: A | Discharge status: Home / Self Care | Payer: 59 | Source: Ambulatory Visit | Attending: Urology | Admitting: Urology

## 2023-10-26 DIAGNOSIS — N200 Calculus of kidney: Secondary | ICD-10-CM | POA: Insufficient documentation

## 2023-10-27 ENCOUNTER — Other Ambulatory Visit (HOSPITAL_COMMUNITY): Payer: 59

## 2023-11-02 ENCOUNTER — Ambulatory Visit (INDEPENDENT_AMBULATORY_CARE_PROVIDER_SITE_OTHER): Payer: Self-pay | Admitting: Urology

## 2023-11-02 VITALS — BP 159/75 | HR 77

## 2023-11-02 DIAGNOSIS — N200 Calculus of kidney: Secondary | ICD-10-CM

## 2023-11-02 MED ORDER — POTASSIUM CITRATE ER 15 MEQ (1620 MG) PO TBCR: 1.0000 | EXTENDED_RELEASE_TABLET | Freq: Two times a day (BID) | ORAL | 11 refills | Status: DC

## 2023-11-02 NOTE — Progress Notes (Signed)
 11/02/2023 3:43 PM   Michel Santee 1960/10/06 [id number]  Referring provider: Ardath Sax, FNP [address] [place],  [place] [address]  Followup nephrolithiasis   HPI: Misty Cruz is a 63yo here for followup for nephrolithiasis. No stone events since last visit. She denies nay flank pain. Renal US shows 5-63mm bilateral renal calculi. She is on UrocitK BID. She does not drink water   PMH: Past Medical History:  Diagnosis Date   Diabetes mellitus    Hypercholesteremia    Hypertension    Thyroid disease     Surgical History: Past Surgical History:  Procedure Laterality Date   ABDOMINAL HYSTERECTOMY     BREAST BIOPSY Right 07/14/2023   MM RT BREAST BX W LOC DEV 1ST LESION IMAGE BX SPEC STEREO GUIDE 07/14/2023 [facility]   COLONOSCOPY N/A 06/04/2013   Procedure: COLONOSCOPY;  Surgeon: West Bali, MD;  [facility];  Service: Endoscopy;  Laterality: N/A;  9:30 AM   CYSTOSCOPY WITH RETROGRADE PYELOGRAM, URETEROSCOPY AND STENT PLACEMENT Bilateral 03/03/2023   Procedure: CYSTOSCOPY WITH RETROGRADE PYELOGRAM, URETEROSCOPY AND STENT PLACEMENT;  Surgeon: Malen Gauze, MD;  [facility];  Service: Urology;  Laterality: Bilateral;   HOLMIUM LASER APPLICATION Bilateral 03/03/2023   Procedure: HOLMIUM LASER APPLICATION;  Surgeon: Malen Gauze, MD;  Location[facility];  Service: Urology;  Laterality: Bilateral;   STONE EXTRACTION WITH BASKET Bilateral 03/03/2023   Procedure: STONE EXTRACTION WITH BASKET;  Surgeon: Malen Gauze, MD;  Location: [facility];  Service: Urology;  Laterality: Bilateral;    Home Medications:  Allergies as of 11/02/2023   No Known Allergies      Medication List        Accurate as of November 02, 2023  3:43 PM. If you have any questions, ask your nurse or doctor.          atorvastatin 10 MG tablet Commonly known as: LIPITOR Take 10 mg by mouth in the morning.   BLACK ELDERBERRY PO Take 2,000 mg  by mouth in the morning.   cetirizine 10 MG tablet Commonly known as: ZYRTEC Take 10 mg by mouth in the morning.   cyclobenzaprine 10 MG tablet Commonly known as: FLEXERIL Take 1 tablet (10 mg total) by mouth 2 (two) times daily as needed.   glipiZIDE 10 MG tablet Commonly known as: GLUCOTROL Take 10 mg by mouth in the morning.   levothyroxine 75 MCG tablet Commonly known as: SYNTHROID Take 75 mcg by mouth every morning.   lisinopril 20 MG tablet Commonly known as: ZESTRIL Take 20 mg by mouth in the morning.   metFORMIN 500 MG tablet Commonly known as: GLUCOPHAGE Take 500 mg by mouth 2 (two) times daily.   Multivitamin Tabs Take 1 tablet by mouth in the morning.   naproxen 500 MG tablet Commonly known as: NAPROSYN Take 500 mg by mouth 2 (two) times daily.   pioglitazone 15 MG tablet Commonly known as: ACTOS Take 15 mg by mouth in the morning.   Potassium Citrate 15 MEQ (1620 MG) Tbcr Commonly known as: Urocit-K 15 Take 1 tablet by mouth 2 (two) times daily.   predniSONE 10 MG (21) Tbpk tablet Commonly known as: STERAPRED UNI-PAK 21 TAB 10 mg DS 12 as directed   VITAMIN D PO Take 1 tablet by mouth in the morning.        Allergies: No Known Allergies  Family History: Family History  Problem Relation Age of Onset  Breast cancer Maternal Aunt    Colon cancer Neg Hx     Social History:  reports that she has quit smoking. Her smoking use included cigarettes. She has a 1.5 pack-year smoking history. She does not have any smokeless tobacco history on file. She reports that she does not drink alcohol and does not use drugs.  ROS: All other review of systems were reviewed and are negative except what is noted above in HPI  Physical Exam: BP (!) 159/75   Pulse 77   LMP 09/06/2010   Constitutional:  Alert and oriented, No acute distress. HEENT: [place] AT, moist mucus membranes.  Trachea midline, no masses. Cardiovascular: No clubbing, cyanosis, or  edema. Respiratory: Normal respiratory effort, no increased work of breathing. GI: Abdomen is soft, nontender, nondistended, no abdominal masses GU: No CVA tenderness.  Lymph: No cervical or inguinal lymphadenopathy. Skin: No rashes, bruises or suspicious lesions. Neurologic: Grossly intact, no focal deficits, moving all 4 extremities. Psychiatric: Normal mood and affect.  Laboratory Data: Lab Results  Component Value Date   WBC 7.1 02/05/2011   HGB 12.5 02/05/2011   HCT 35.2 (L) 02/05/2011   MCV 92.6 02/05/2011   PLT 194 02/05/2011    Lab Results  Component Value Date   CREATININE 0.75 08/03/2023    No results found for: "PSA"  No results found for: "TESTOSTERONE"  No results found for: "HGBA1C"  Urinalysis    Component Value Date/Time   COLORURINE YELLOW 09/03/2011 2215   APPEARANCEUR Clear 04/27/2023 1306   LABSPEC 1.020 09/03/2011 2215   PHURINE 6.0 09/03/2011 2215   GLUCOSEU Negative 04/27/2023 1306   HGBUR TRACE (A) 09/03/2011 2215   BILIRUBINUR Negative 04/27/2023 1306   KETONESUR NEGATIVE 09/03/2011 2215   PROTEINUR Negative 04/27/2023 1306   PROTEINUR NEGATIVE 09/03/2011 2215   UROBILINOGEN 0.2 09/03/2011 2215   NITRITE Negative 04/27/2023 1306   NITRITE NEGATIVE 09/03/2011 2215   LEUKOCYTESUR Negative 04/27/2023 1306    Lab Results  Component Value Date   LABMICR Comment 04/27/2023   WBCUA 6-10 (A) 03/09/2023   LABEPIT 0-10 03/09/2023   BACTERIA None seen 03/09/2023    Pertinent Imaging: Renal US 10/26/2023: Images reviewed and discussed with the patient  Results for orders placed during the hospital encounter of 08/25/08  DG Abd 1 View  Narrative Clinical Data: Left flank pain.  The patient self removed a left ureteral stent earlier today. Recent left lithotripsy.  ABDOMEN - 1 VIEW 08/25/2008:  Comparison: Unenhanced CT abdomen pelvis 08/07/2008.  Findings: Tiny stone fragment projected over the expected location of the mid left kidney.   The calculi identified in the right kidney on the prior examination are no longer visualized.  No visible distal ureteral calculi on either side.  Small phleboliths low in the pelvis as noted on the prior CT.  Bilateral tubal ligation clips.  Large amount of stool throughout the colon from cecum to rectum.  No evidence of bowel obstruction or significant ileus.  IMPRESSION:  1.  Tiny stone fragment projected over the expected location of the left mid kidney.  No visible left ureteral calculi. 2.  No acute abdominal abnormality apart from probable constipation.  Provider: Samule Ohm  No results found for this or any previous visit.  No results found for this or any previous visit.  No results found for this or any previous visit.  No results found for this or any previous visit.  No results found for this or any previous visit.  Results for  orders placed in visit on 12/01/22  CT HEMATURIA WORKUP  Narrative CLINICAL DATA:  Gross hematuria, left flank pain  EXAM: CT ABDOMEN AND PELVIS WITHOUT AND WITH CONTRAST  TECHNIQUE: Multidetector CT imaging of the abdomen and pelvis was performed following the standard protocol before and following the bolus administration of intravenous contrast.  RADIATION DOSE REDUCTION: This exam was performed according to the departmental dose-optimization program which includes automated exposure control, adjustment of the mA and/or kV according to patient size and/or use of iterative reconstruction technique.  CONTRAST:  125 mL Isovue-300 iodinated contrast IV  COMPARISON:  09/04/2011  FINDINGS: Lower chest: No acute abnormality.  Coronary artery calcifications.  Hepatobiliary: No focal liver abnormality is seen. Status post cholecystectomy. No biliary dilatation.  Pancreas: Unremarkable. No pancreatic ductal dilatation or surrounding inflammatory changes.  Spleen: Normal in size without significant  abnormality.  Adrenals/Urinary Tract: Discoid left adrenal gland. Normal right adrenal gland. Small bilateral nonobstructive renal calculi. 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 Normal size and location of the right kidney. No ureteral calculi or hydronephrosis. Limited opacification of the right ureter. Within this limitation, no evidence of urinary tract filling defect on delayed phase imaging. Bladder is unremarkable.  Stomach/Bowel: Stomach is within normal limits. Appendix not clearly visualized. No evidence of bowel wall thickening, distention, or inflammatory changes. Moderate burden of stool throughout the colon.  Vascular/Lymphatic: Aortic atherosclerosis. Varices in the left upper quadrant (series 10, image 30). No enlarged abdominal or pelvic lymph nodes.  Reproductive: No mass or other significant abnormality. The uterus is present despite stated history of hysterectomy.  Other: No abdominal wall hernia or abnormality. No ascites. 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  Musculoskeletal: No acute or significant osseous findings.  IMPRESSION: 1. Small bilateral nonobstructive renal calculi. No ureteral calculi or hydronephrosis. 2. 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 3. Normal size and location of the right kidney. 4. Limited opacification of the right ureter. Within this limitation, no evidence of urinary tract filling defect on delayed phase imaging. 5. 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  Aortic Atherosclerosis (ICD10-I70.0).   Electronically Signed By: Jearld Lesch M.D. On: 12/07/2022 17:12  No results found for this or any previous visit.   Assessment & Plan:    1. Nephrolithiasis (Primary) Continue urocit  K Increase water intake.  Followup 3 months with CT stone study - Urinalysis, Routine w reflex microscopic   No follow-ups on file.  Wilkie Aye, MD  [facility] [place]

## 2023-11-03 LAB — URINALYSIS, ROUTINE W REFLEX MICROSCOPIC
Bilirubin, UA: NEGATIVE
Ketones, UA: NEGATIVE
Leukocytes,UA: NEGATIVE
Nitrite, UA: NEGATIVE
Protein,UA: NEGATIVE
RBC, UA: NEGATIVE
Specific Gravity, UA: 1.02 (ref 1.005–1.030)
Urobilinogen, Ur: 1 mg/dL (ref 0.2–1.0)
pH, UA: 6 (ref 5.0–7.5)

## 2023-11-08 ENCOUNTER — Encounter: Payer: Self-pay | Admitting: Urology

## 2023-11-08 NOTE — Patient Instructions (Signed)

## 2023-11-30 ENCOUNTER — Encounter: Payer: Self-pay | Admitting: Orthopedic Surgery

## 2023-11-30 ENCOUNTER — Ambulatory Visit: Admitting: Orthopedic Surgery

## 2023-11-30 VITALS — BP 167/74 | HR 73 | Ht 59.0 in | Wt 147.0 lb

## 2023-11-30 DIAGNOSIS — E1169 Type 2 diabetes mellitus with other specified complication: Secondary | ICD-10-CM | POA: Insufficient documentation

## 2023-11-30 DIAGNOSIS — M62830 Muscle spasm of back: Secondary | ICD-10-CM

## 2023-11-30 DIAGNOSIS — I10 Essential (primary) hypertension: Secondary | ICD-10-CM | POA: Insufficient documentation

## 2023-11-30 DIAGNOSIS — M792 Neuralgia and neuritis, unspecified: Secondary | ICD-10-CM

## 2023-11-30 DIAGNOSIS — Z87891 Personal history of nicotine dependence: Secondary | ICD-10-CM | POA: Insufficient documentation

## 2023-11-30 DIAGNOSIS — E559 Vitamin D deficiency, unspecified: Secondary | ICD-10-CM | POA: Insufficient documentation

## 2023-11-30 DIAGNOSIS — E785 Hyperlipidemia, unspecified: Secondary | ICD-10-CM | POA: Insufficient documentation

## 2023-11-30 MED ORDER — METHOCARBAMOL 500 MG PO TABS: 500.0000 mg | ORAL_TABLET | Freq: Three times a day (TID) | ORAL | 0 refills | Status: AC | PRN

## 2023-11-30 NOTE — Patient Instructions (Signed)
 Note for work - out of work until 12/12/23     Cervical Strain and Sprain Rehab You have pain and stiffness in your neck.  The muscles around your neck are irritated.  Recommend using heat (heating pad, or hot water in the shower) to warm up the affected muscles.  Then proceed with stretching and strengthening.  Do not stretch until it hurts, but you should feel a pull.  With each exercise, you should be able to stretch a little bit further.  Attempting these exercises daily, or on a regular basis, can improve your symptoms over time.  Do not expect immediate, sustained improvement.  But, it will make your symptoms better over time.   Ask your health care provider which exercises are safe for you. Do exercises exactly as told by your health care provider and adjust them as directed. It is normal to feel mild stretching, pulling, tightness, or discomfort as you do these exercises. Stop right away if you feel sudden pain or your pain gets worse. Do not begin these exercises until told by your health care provider. Stretching and range-of-motion exercises Cervical side bending  Using good posture, sit on a stable chair or stand up. Without moving your shoulders, slowly tilt your left / right ear to your shoulder until you feel a stretch in the opposite side neck muscles. You should be looking straight ahead. Hold for 10 seconds. Repeat with the other side of your neck. Repeat 10 times. Complete this exercise 1-2 times a day. Cervical rotation  Using good posture, sit on a stable chair or stand up. Slowly turn your head to the side as if you are looking over your left / right shoulder. Keep your eyes level with the ground. Stop when you feel a stretch along the side and the back of your neck. Hold for 10 seconds. Repeat this by turning to your other side. Repeat 10 times. Complete this exercise 1-2 times a day. Thoracic extension and pectoral stretch Roll a towel or a small blanket so it is  about 4 inches (10 cm) in diameter. Lie down on your back on a firm surface. Put the towel lengthwise, under your spine in the middle of your back. It should not be under your shoulder blades. The towel should line up with your spine from your middle back to your lower back. Put your hands behind your head and let your elbows fall out to your sides. Hold for 10 seconds. Repeat 10 times. Complete this exercise 1-2 times a day. Strengthening exercises Isometric upper cervical flexion Lie on your back with a thin pillow behind your head and a small rolled-up towel under your neck. Gently tuck your chin toward your chest and nod your head down to look toward your feet. Do not lift your head off the pillow. Hold for 10 seconds. Release the tension slowly. Relax your neck muscles completely before you repeat this exercise. Repeat 10 times. Complete this exercise 1-2 times a day. Isometric cervical extension  Stand about 6 inches (15 cm) away from a wall, with your back facing the wall. Place a soft object, about 6-8 inches (15-20 cm) in diameter, between the back of your head and the wall. A soft object could be a small pillow, a ball, or a folded towel. Gently tilt your head back and press into the soft object. Keep your jaw and forehead relaxed. Hold for 10 seconds. Release the tension slowly. Relax your neck muscles completely before you repeat this  exercise. Repeat 10 times. Complete this exercise 1-2 times a day. Posture and body mechanics Body mechanics refers to the movements and positions of your body while you do your daily activities. Posture is part of body mechanics. Good posture and healthy body mechanics can help to relieve stress in your body's tissues and joints. Good posture means that your spine is in its natural S-curve position (your spine is neutral), your shoulders are pulled back slightly, and your head is not tipped forward. The following are general guidelines for applying  improved posture and body mechanics to your everyday activities. Sitting  When sitting, keep your spine neutral and keep your feet flat on the floor. Use a footrest, if necessary, and keep your thighs parallel to the floor. Avoid rounding your shoulders, and avoid tilting your head forward. When working at a desk or a computer, keep your desk at a height where your hands are slightly lower than your elbows. Slide your chair under your desk so you are close enough to maintain good posture. When working at a computer, place your monitor at a height where you are looking straight ahead and you do not have to tilt your head forward or downward to look at the screen. Standing  When standing, keep your spine neutral and keep your feet about hip-width apart. Keep a slight bend in your knees. Your ears, shoulders, and hips should line up. When you do a task in which you stand in one place for a long time, place one foot up on a stable object that is 2-4 inches (5-10 cm) high, such as a footstool. This helps keep your spine neutral. Resting When lying down and resting, avoid positions that are most painful for you. Try to support your neck in a neutral position. You can use a contour pillow or a small rolled-up towel. Your pillow should support your neck but not push on it. This information is not intended to replace advice given to you by your health care provider. Make sure you discuss any questions you have with your health care provider. Document Revised: 11/29/2018 Document Reviewed: 05/10/2018 Elsevier Patient Education  2022 [employer]    Sciatica Rehab  Ask your health care provider which exercises are safe for you. Do exercises exactly as told by your health care provider and adjust them as directed. It is normal to feel mild stretching, pulling, tightness, or discomfort as you do these exercises. Stop right away if you feel sudden pain or your pain gets worse. Do not begin these exercises  until told by your health care provider.   Stretching and range-of-motion exercises These exercises warm up your muscles and joints and improve the movement and flexibility of your hips and back. These exercises also help to relieve pain, numbness, and tingling. Sciatic nerve glide Sit in a chair with your head facing down toward your chest. Place your hands behind your back. Let your shoulders slump forward. Slowly straighten one of your legs while you tilt your head back as if you are looking toward the ceiling. Only straighten your leg as far as you can without making your symptoms worse. Hold this position for 10 seconds. Slowly return to the starting position. Repeat with your other leg. Repeat 10 times. Complete this exercise 1-2 times a day. Knee to chest with hip adduction and internal rotation  Lie on your back on a firm surface with both legs straight. Bend one of your knees and move it up toward your chest  until you feel a gentle stretch in your lower back and buttock. Then, move your knee toward the shoulder that is on the opposite side from your leg. This is hip adduction and internal rotation. Hold your leg in this position by holding on to the front of your knee. Hold this position for 10 seconds. Slowly return to the starting position. Repeat with your other leg. Repeat 10 times. Complete this exercise 1-2 times a day. Prone extension on elbows  Lie on your abdomen on a firm surface. A bed may be too soft for this exercise. Prop yourself up on your elbows. Use your arms to help lift your chest up until you feel a gentle stretch in your abdomen and your lower back. This will place some of your body weight on your elbows. If this is uncomfortable, try stacking pillows under your chest. Your hips should stay down, against the surface that you are lying on. Keep your hip and back muscles relaxed. Hold this position for 10 seconds. Slowly relax your upper body and return to  the starting position. Repeat 10 times. Complete this exercise 1-2 times a day. Strengthening exercises These exercises build strength and endurance in your back. Endurance is the ability to use your muscles for a long time, even after they get tired. Pelvic tilt This exercise strengthens the muscles that lie deep in the abdomen. Lie on your back on a firm surface. Bend your knees and keep your feet flat on the floor. Tense your abdominal muscles. Tip your pelvis up toward the ceiling and flatten your lower back into the floor. To help with this exercise, you may place a small towel under your lower back and try to push your back into the towel. Hold this position for 10 seconds. Let your muscles relax completely before you repeat this exercise. Repeat 10 times. Complete this exercise 1-2 times a day. Alternating arm and leg raises  Get on your hands and knees on a firm surface. If you are on a hard floor, you may want to use padding, such as an exercise mat, to cushion your knees. Line up your arms and legs. Your hands should be directly below your shoulders, and your knees should be directly below your hips. Lift your left leg behind you. At the same time, raise your right arm and straighten it in front of you. Do not lift your leg higher than your hip. Do not lift your arm higher than your shoulder. Keep your abdominal and back muscles tight. Keep your hips facing the ground. Do not arch your back. Keep your balance carefully, and do not hold your breath. Hold this position for 10 seconds. Slowly return to the starting position. Repeat with your right leg and your left arm. Repeat 10 times. Complete this exercise 1-2 times a day. Posture and body mechanics Good posture and healthy body mechanics can help to relieve stress in your body's tissues and joints. Body mechanics refers to the movements and positions of your body while you do your daily activities. Posture is part of body  mechanics. Good posture means: Your spine is in its natural S-curve position (neutral). Your shoulders are pulled back slightly. Your head is not tipped forward. Follow these guidelines to improve your posture and body mechanics in your everyday activities. Standing  When standing, keep your spine neutral and your feet about hip width apart. Keep a slight bend in your knees. Your ears, shoulders, and hips should line up. When you do  a task in which you stand in one place for a long time, place one foot up on a stable object that is 2-4 inches (5-10 cm) high, such as a footstool. This helps keep your spine neutral. Sitting  When sitting, keep your spine neutral and keep your feet flat on the floor. Use a footrest, if necessary, and keep your thighs parallel to the floor. Avoid rounding your shoulders, and avoid tilting your head forward. When working at a desk or a computer, keep your desk at a height where your hands are slightly lower than your elbows. Slide your chair under your desk so you are close enough to maintain good posture. When working at a computer, place your monitor at a height where you are looking straight ahead and you do not have to tilt your head forward or downward to look at the screen. Resting When lying down and resting, avoid positions that are most painful for you. If you have pain with activities such as sitting, bending, stooping, or squatting, lie in a position in which your body does not bend very much. For example, avoid curling up on your side with your arms and knees near your chest (fetal position). If you have pain with activities such as standing for a long time or reaching with your arms, lie with your spine in a neutral position and bend your knees slightly. Try the following positions: Lying on your side with a pillow between your knees. Lying on your back with a pillow under your knees. Lifting  When lifting objects, keep your feet at least shoulder width  apart and tighten your abdominal muscles. Bend your knees and hips and keep your spine neutral. It is important to lift using the strength of your legs, not your back. Do not lock your knees straight out. Always ask for help to lift heavy or awkward objects. This information is not intended to replace advice given to you by your health care provider. Make sure you discuss any questions you have with your health care provider. Document Revised: 12/01/2018 Document Reviewed: 08/31/2018 Elsevier Patient Education  2022 [employer].

## 2023-12-01 NOTE — Progress Notes (Signed)
 New Patient Visit  Assessment: Misty Cruz is a 63 y.o. female with the following: 1. Radicular pain in left arm  Plan: FAREEDA DOWNARD continues to have a lot of pain in her shoulder, specifically the left trapezius.  Work is likely contributing, making her symptoms worse.  I recommend some time off work, and a note has been provided.  Prescription for Robaxin provided.  Home exercise program to initiate.   Follow-up: Return if symptoms worsen or fail to improve.  Subjective:  Chief Complaint  Patient presents with   Neck Pain    Into left arm and left shoulder and neck and back      History of Present Illness: Misty Cruz is a 63 y.o. female who returns to clinic for repeat evaluation.  She has multiple complaints in clinic today.  I saw her in clinic several months ago.  At that time, she had a lot of pain within the left trapezius.  Prednisone and muscle relaxers did not alleviate this discomfort.  She continues to work in a daycare.  Her pain is getting worse.  She is not complaining of pain which radiates from the left trapezius into the shoulder, and down the arm.  She also has pain in her back.  During today's clinic visit, she also reports that she is having aching in bilateral knees.  She also has numbness and tingling in both feet.  She has not had time off work in a long time.   Review of Systems: No fevers or chills No numbness or tingling No chest pain No shortness of breath No bowel or bladder dysfunction No GI distress No headaches    Objective: BP (!) 167/74   Pulse 73   Ht 4\' 11"  (1.499 m)   Wt 147 lb (66.7 kg)   LMP 09/06/2010   BMI 29.69 kg/m   Physical Exam:  General: Alert and oriented. and No acute distress. Gait: Left sided antalgic gait.  Left shoulder without deformity.  Spasm within the left trapezius.  Sensation is intact throughout bilateral hands.  Strength is equal bilaterally.  She has good range of motion of the left  shoulder.  IMAGING: No new imaging obtained today    New Medications:  Meds ordered this encounter  Medications   methocarbamol (ROBAXIN) 500 MG tablet    Sig: Take 1 tablet (500 mg total) by mouth every 8 (eight) hours as needed for muscle spasms.    Dispense:  30 tablet    Refill:  0      Oliver Barre, MD  12/01/2023 5:22 PM

## 2023-12-05 ENCOUNTER — Telehealth: Payer: Self-pay | Admitting: Orthopedic Surgery

## 2023-12-05 NOTE — Telephone Encounter (Signed)
 Returned the pt's call, she saw her family dr today at [facility], they did an xrays as well, of her back.  Advised that we need the progress note, x-ray report and CD before we can schedule.  She verbalized understanding.

## 2023-12-14 ENCOUNTER — Encounter: Payer: Self-pay | Admitting: Orthopaedic Surgery

## 2023-12-14 ENCOUNTER — Telehealth: Payer: Self-pay | Admitting: Orthopaedic Surgery

## 2023-12-14 ENCOUNTER — Ambulatory Visit (INDEPENDENT_AMBULATORY_CARE_PROVIDER_SITE_OTHER): Admitting: Orthopaedic Surgery

## 2023-12-14 VITALS — BP 153/91 | HR 66 | Ht 59.0 in | Wt 152.0 lb

## 2023-12-14 DIAGNOSIS — M79604 Pain in right leg: Secondary | ICD-10-CM

## 2023-12-14 DIAGNOSIS — M545 Low back pain, unspecified: Secondary | ICD-10-CM

## 2023-12-14 DIAGNOSIS — R29898 Other symptoms and signs involving the musculoskeletal system: Secondary | ICD-10-CM | POA: Diagnosis not present

## 2023-12-14 MED ORDER — HYDROCODONE-ACETAMINOPHEN 5-325 MG PO TABS: 1.0000 | ORAL_TABLET | ORAL | 0 refills | Status: AC | PRN

## 2023-12-14 MED ORDER — CYCLOBENZAPRINE HCL 10 MG PO TABS: 10.0000 mg | ORAL_TABLET | Freq: Every day | ORAL | 0 refills | Status: DC

## 2023-12-14 MED ORDER — PREDNISONE 5 MG (21) PO TBPK: ORAL_TABLET | ORAL | 0 refills | Status: DC

## 2023-12-14 NOTE — Progress Notes (Signed)
 Subjective:    Patient ID: Misty Cruz, female    DOB: 1961/01/10, 63 y.o.   MRN: [id number]  HPI She has had lower back pain for some time, over a year that is getting worse.  She has pain running down the right leg to the foot that lasts a while and is very painful.  She has an abnormal gait.  Her right leg is weak.  She has seen her family doctor at [facility] and had X-rays of the lumbar spine on 12-05-23.  She has had pain and problems before the X-rays.  She has been on Naprosyn with little help.    She has kidney stones and is scheduled for possible surgery in July.  She has diabetes controlled by Metformin.    She has no trauma to the back.  She has a curvature in the back since a young adult.  She has tried Tylenol , heat, ice, rubs with no help.  She is getting worse.  She has seen Dr. Ernesta Cruz for neck pain.   Review of Systems  Constitutional:  Positive for activity change.  Genitourinary:        History of kidney stones.  Musculoskeletal:  Positive for arthralgias, back pain, gait problem and myalgias.  All other systems reviewed and are negative. For Review of Systems, all other systems reviewed and are negative.  The following is a summary of the past history medically, past history surgically, known current medicines, social history and family history.  This information is gathered electronically by the computer from prior information and documentation.  I review this each visit and have found including this information at this point in the chart is beneficial and informative.   Past Medical History:  Diagnosis Date   Diabetes mellitus    Hypercholesteremia    Hypertension    Thyroid disease     Past Surgical History:  Procedure Laterality Date   ABDOMINAL HYSTERECTOMY     BREAST BIOPSY Right 07/14/2023   MM RT BREAST BX W LOC DEV 1ST LESION IMAGE BX SPEC STEREO GUIDE 07/14/2023 [facility]   COLONOSCOPY N/A 06/04/2013   Procedure:  COLONOSCOPY;  Surgeon: Alyce Jubilee, MD;  Location: [facility];  Service: Endoscopy;  Laterality: N/A;  9:30 AM   CYSTOSCOPY WITH RETROGRADE PYELOGRAM, URETEROSCOPY AND STENT PLACEMENT Bilateral 03/03/2023   Procedure: CYSTOSCOPY WITH RETROGRADE PYELOGRAM, URETEROSCOPY AND STENT PLACEMENT;  Surgeon: Marco Severs, MD;  Location: [facility];  Service: Urology;  Laterality: Bilateral;   HOLMIUM LASER APPLICATION Bilateral 03/03/2023   Procedure: HOLMIUM LASER APPLICATION;  Surgeon: Marco Severs, MD;  Location: [facility];  Service: Urology;  Laterality: Bilateral;   STONE EXTRACTION WITH BASKET Bilateral 03/03/2023   Procedure: STONE EXTRACTION WITH BASKET;  Surgeon: Marco Severs, MD;  Location: [facility];  Service: Urology;  Laterality: Bilateral;    Current Outpatient Medications on File Prior to Visit  Medication Sig Dispense Refill   atorvastatin (LIPITOR) 10 MG tablet Take 10 mg by mouth in the morning.     cetirizine (ZYRTEC) 10 MG tablet Take 10 mg by mouth in the morning.     Cholecalciferol (VITAMIN D PO) Take 1 tablet by mouth in the morning.     glipiZIDE (GLUCOTROL) 10 MG tablet Take 10 mg by mouth in the morning.     levothyroxine (SYNTHROID) 75 MCG tablet Take 75 mcg by mouth every morning.     lisinopril (ZESTRIL) 20 MG tablet Take 20 mg  by mouth in the morning.     metFORMIN (GLUCOPHAGE) 500 MG tablet Take 500 mg by mouth 2 (two) times daily.     methocarbamol  (ROBAXIN ) 500 MG tablet Take 1 tablet (500 mg total) by mouth every 8 (eight) hours as needed for muscle spasms. 30 tablet 0   Multiple Vitamin (MULTIVITAMIN) TABS Take 1 tablet by mouth in the morning.     naproxen (NAPROSYN) 500 MG tablet Take 500 mg by mouth 2 (two) times daily.     pioglitazone (ACTOS) 15 MG tablet Take 15 mg by mouth in the morning.     Potassium Citrate  (UROCIT-K  15) 15 MEQ (1620 MG) TBCR Take 1 tablet by mouth 2 (two) times daily. 60 tablet 11   BLACK ELDERBERRY PO Take 2,000 mg by mouth  in the morning. (Patient not taking: Reported on 11/30/2023)     No current facility-administered medications on file prior to visit.    Social History   Socioeconomic History   Marital status: Married    Spouse name: Not on file   Number of children: Not on file   Years of education: Not on file   Highest education level: Not on file  Occupational History   Not on file  Tobacco Use   Smoking status: Former    Current packs/day: 0.50    Average packs/day: 0.5 packs/day for 3.0 years (1.5 ttl pk-yrs)    Types: Cigarettes   Smokeless tobacco: Not on file  Substance and Sexual Activity   Alcohol use: No   Drug use: No   Sexual activity: Not on file  Other Topics Concern   Not on file  Social History Narrative   Not on file   Social Drivers of Health   Financial Resource Strain: Not on file  Food Insecurity: Not on file  Transportation Needs: Not on file  Physical Activity: Not on file  Stress: Not on file  Social Connections: Not on file  Intimate Partner Violence: Not on file    Family History  Problem Relation Age of Onset   Breast cancer Maternal Aunt    Colon cancer Neg Hx     BP (!) 153/91   Pulse 66   Ht 4\' 11"  (1.499 m)   Wt 152 lb (68.9 kg)   LMP 09/06/2010   BMI 30.70 kg/m   Body mass index is 30.7 kg/m.      Objective:   Physical Exam Vitals and nursing note reviewed. Exam conducted with a chaperone present.  Constitutional:      Appearance: She is well-developed.  HENT:     Head: Normocephalic and atraumatic.  Eyes:     Conjunctiva/sclera: Conjunctivae normal.     Pupils: Pupils are equal, round, and reactive to light.  Cardiovascular:     Rate and Rhythm: Normal rate and regular rhythm.  Pulmonary:     Effort: Pulmonary effort is normal.  Abdominal:     Palpations: Abdomen is soft.  Musculoskeletal:       Arms:     Cervical back: Normal range of motion and neck supple.  Skin:    General: Skin is warm and dry.  Neurological:      Mental Status: She is alert and oriented to person, place, and time.     Cranial Nerves: No cranial nerve deficit.     Motor: No abnormal muscle tone.     Coordination: Coordination normal.     Deep Tendon Reflexes: Reflexes are normal and symmetric. Reflexes normal.  Psychiatric:        Behavior: Behavior normal.        Thought Content: Thought content normal.        Judgment: Judgment normal.     I have reviewed the notes and X-rays from [place].  I have independently reviewed and interpreted x-rays of this patient done at another site by another physician or qualified health professional.       Assessment & Plan:   Encounter Diagnoses  Name Primary?   Lumbar pain with radiation down right leg Yes   Right leg weakness    I am concerned about the weakness of the right lower leg, positive SLR, decreased reflexes and abnormal gait.  She has significant degenerative changes of the lumbar spine and scoliosis of lumbar spine with apex at L2 to L3 area with possible compression fracture old at L2.  I would like to get a MRI of the lumbar spine.  She has diabetes but I will give prednisone  dose pack this week.  Stop if raises blood sugars too much.  She does not know her A1C level.  Return in one week.  I will give pain medicine for in case she needs it.  I have reviewed the [place]  Controlled Substance Reporting System web site prior to prescribing narcotic medicine for this patient.  Call if any problem.  Precautions discussed.  Electronically Signed Pleasant Brilliant, MD 4/23/20259:47 AM

## 2023-12-14 NOTE — Telephone Encounter (Signed)
 Dr. Vicente Graham pt - pt lvm, she was seen this morning.  She has gotten her MRI scheduled and she wants to know if it's approved.  [phone]

## 2023-12-14 NOTE — Telephone Encounter (Signed)
 Dr. Vicente Graham pt - pt lvm stating she missed a call.

## 2023-12-14 NOTE — Patient Instructions (Signed)
 While we are working on your approval for MRI please go ahead and call to schedule your appointment with [facility] within at least one (1) week.   Central Scheduling [phone]

## 2023-12-14 NOTE — Telephone Encounter (Signed)
 Yes, this just got approved. Tried calling pt.

## 2023-12-17 ENCOUNTER — Ambulatory Visit (HOSPITAL_COMMUNITY)
Admission: RE | Admit: 2023-12-17 | Discharge: 2023-12-17 | Disposition: A | Discharge status: Home / Self Care | Source: Ambulatory Visit | Attending: Orthopaedic Surgery | Admitting: Orthopaedic Surgery

## 2023-12-17 DIAGNOSIS — M545 Low back pain, unspecified: Secondary | ICD-10-CM | POA: Insufficient documentation

## 2023-12-17 DIAGNOSIS — M79604 Pain in right leg: Secondary | ICD-10-CM | POA: Insufficient documentation

## 2023-12-17 DIAGNOSIS — R29898 Other symptoms and signs involving the musculoskeletal system: Secondary | ICD-10-CM | POA: Diagnosis present

## 2023-12-19 NOTE — Telephone Encounter (Signed)
 Contacted pt-she has already had the MRI- I called reading room to have MRI read before her upcoming appt.

## 2023-12-28 ENCOUNTER — Ambulatory Visit: Admitting: Orthopaedic Surgery

## 2023-12-28 ENCOUNTER — Other Ambulatory Visit: Payer: Self-pay | Admitting: *Deleted

## 2023-12-28 ENCOUNTER — Encounter: Payer: Self-pay | Admitting: Orthopaedic Surgery

## 2023-12-28 VITALS — BP 98/58 | HR 70 | Ht 59.0 in | Wt 152.0 lb

## 2023-12-28 DIAGNOSIS — M79604 Pain in right leg: Secondary | ICD-10-CM

## 2023-12-28 DIAGNOSIS — M545 Low back pain, unspecified: Secondary | ICD-10-CM | POA: Diagnosis not present

## 2023-12-28 NOTE — Progress Notes (Signed)
 My back hurts.  She had MRI of the lumbar spine showing: IMPRESSION: 1. Scoliotic curvature convex to the right with the apex at L2-3. 2. Degenerative endplate marrow changes at L1-2, L2-3, L3-4 and L4-5 which could relate to regional back pain. 3. L2-3: Mild stenosis of the left lateral recess and intervertebral foramen on the left but without definite neural compression. 4. L3-4: Mild stenosis of both lateral recesses but without visible neural compression. 5. L4-5: Right lateral recess and foraminal stenosis that could possibly be symptomatic. 6. 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 This could be a cause of back pain or referred facet syndrome pain.  I have explained the findings to her.  I will have her see Dr. Daisey Dryer for epidural consideration.  I have independently reviewed the MRI.    She has lower back pain, NV intact, ROM decreased secondary to pain, muscle tone and strength normal.  Encounter Diagnosis  Name Primary?   Lumbar pain with radiation down right leg Yes   To Dr. Daisey Dryer.  I will see her in six weeks.  Call if any problem.  Precautions discussed.  Electronically Signed Pleasant Brilliant, MD 5/7/20259:54 AM

## 2024-01-18 ENCOUNTER — Other Ambulatory Visit: Payer: Self-pay | Admitting: Physical Medicine and Rehabilitation

## 2024-01-18 MED ORDER — DIAZEPAM 5 MG PO TABS: ORAL_TABLET | ORAL | 0 refills | Status: AC

## 2024-01-25 ENCOUNTER — Encounter: Payer: Self-pay | Admitting: Orthopaedic Surgery

## 2024-01-25 ENCOUNTER — Ambulatory Visit: Admitting: Orthopaedic Surgery

## 2024-01-25 ENCOUNTER — Other Ambulatory Visit (INDEPENDENT_AMBULATORY_CARE_PROVIDER_SITE_OTHER): Payer: Self-pay

## 2024-01-25 DIAGNOSIS — G8929 Other chronic pain: Secondary | ICD-10-CM | POA: Diagnosis not present

## 2024-01-25 DIAGNOSIS — M25512 Pain in left shoulder: Secondary | ICD-10-CM

## 2024-01-25 MED ORDER — METHYLPREDNISOLONE ACETATE 40 MG/ML IJ SUSP
40.0000 mg | Freq: Once | INTRAMUSCULAR | Status: AC
  Administered 2024-01-25: 40 mg via INTRA_ARTICULAR

## 2024-01-25 NOTE — Progress Notes (Signed)
 My shoulder hurts now.  She has pain in the left shoulder for several months.  She has no trauma.  She has no swelling, no redness.  It hurts to raise left hand overhead.  She has no numbness.  Examination of left Upper Extremity is done.  Inspection:   Overall:  Elbow non-tender without crepitus or defects, forearm non-tender without crepitus or defects, wrist non-tender without crepitus or defects, hand non-tender.    Shoulder: with glenohumeral joint tenderness, without effusion.   Upper arm:  without swelling and tenderness   Range of motion:   Overall:  Full range of motion of the elbow, full range of motion of wrist and full range of motion in fingers.   Shoulder:  left  170 degrees forward flexion; 160 degrees abduction; 25 degrees internal rotation, 25 degrees external rotation, 5 degrees extension, 40 degrees adduction.   Stability:   Overall:  Shoulder, elbow and wrist stable   Strength and Tone:   Overall full shoulder muscles strength, full upper arm strength and normal upper arm bulk and tone.   X-rays were done of the left shoulder, reported separately.  Encounter Diagnosis  Name Primary?   Chronic left shoulder pain Yes   PROCEDURE NOTE:  The patient request injection, verbal consent was obtained.  The left shoulder was prepped appropriately after time out was performed.   Sterile technique was observed and injection of 1 cc of DepoMedrol 40mg  with several cc's of plain xylocaine . Anesthesia was provided by ethyl chloride and a 20-gauge needle was used to inject the shoulder area. A posterior approach was used.  The injection was tolerated well.  A band aid dressing was applied.  The patient was advised to apply ice later today and tomorrow to the injection sight as needed.  Return in two weeks.  They are applying for disability.  She has appointment to see Dr. Daisey Dryer soon.  Call if any problem.  Precautions discussed.  Electronically Signed Pleasant Brilliant, MD 6/4/202510:39 AM

## 2024-01-25 NOTE — Addendum Note (Signed)
 Addended by: [place] Snow T on: 01/25/2024 11:56 AM   Modules accepted: Orders

## 2024-01-31 ENCOUNTER — Ambulatory Visit (HOSPITAL_COMMUNITY)

## 2024-02-01 ENCOUNTER — Ambulatory Visit
Admission: RE | Admit: 2024-02-01 | Discharge: 2024-02-01 | Disposition: A | Discharge status: Home / Self Care | Source: Ambulatory Visit | Attending: Urology | Admitting: Urology

## 2024-02-01 DIAGNOSIS — N200 Calculus of kidney: Secondary | ICD-10-CM

## 2024-02-08 ENCOUNTER — Ambulatory Visit: Admitting: Orthopaedic Surgery

## 2024-02-08 ENCOUNTER — Encounter: Payer: Self-pay | Admitting: Orthopaedic Surgery

## 2024-02-08 VITALS — BP 139/80 | HR 59 | Ht 59.0 in | Wt 152.0 lb

## 2024-02-08 DIAGNOSIS — G8929 Other chronic pain: Secondary | ICD-10-CM | POA: Diagnosis not present

## 2024-02-08 DIAGNOSIS — M79604 Pain in right leg: Secondary | ICD-10-CM

## 2024-02-08 DIAGNOSIS — M25512 Pain in left shoulder: Secondary | ICD-10-CM

## 2024-02-08 DIAGNOSIS — M545 Low back pain, unspecified: Secondary | ICD-10-CM | POA: Diagnosis not present

## 2024-02-08 NOTE — Patient Instructions (Signed)
 Follow up with Dr. Iline Mallory a couple weeks for the shoulder.   Keep appt with Dr. Daisey Dryer on Monday June 23.

## 2024-02-08 NOTE — Progress Notes (Signed)
 My shoulder is sore.  My back hurts.  She is to see Dr. Daisey Dryer next week for epidural injection of the back.  She has less pain of the left shoulder today after the injection last time.  She has no new trauma, no swelling, no numbness.  ROM of the left shoulder is good today with pain in the extremes.  Lower back is diffusely tender, ROM decreased, muscle tone and strength normal, NV intact.  Encounter Diagnoses  Name Primary?   Chronic left shoulder pain Yes   Lumbar pain with radiation down right leg    To see Dr. Daisey Dryer.  Return to see me in two weeks. . Call if any problem.  Precautions discussed.  Electronically Signed Pleasant Brilliant, MD 6/18/202510:08 AM

## 2024-02-13 ENCOUNTER — Other Ambulatory Visit: Payer: Self-pay

## 2024-02-13 ENCOUNTER — Ambulatory Visit: Admitting: Physical Medicine and Rehabilitation

## 2024-02-13 VITALS — BP 177/90 | HR 58

## 2024-02-13 DIAGNOSIS — M5416 Radiculopathy, lumbar region: Secondary | ICD-10-CM

## 2024-02-13 DIAGNOSIS — G8929 Other chronic pain: Secondary | ICD-10-CM

## 2024-02-13 DIAGNOSIS — M5441 Lumbago with sciatica, right side: Secondary | ICD-10-CM

## 2024-02-13 DIAGNOSIS — M48061 Spinal stenosis, lumbar region without neurogenic claudication: Secondary | ICD-10-CM

## 2024-02-13 DIAGNOSIS — M47816 Spondylosis without myelopathy or radiculopathy, lumbar region: Secondary | ICD-10-CM

## 2024-02-13 MED ORDER — METHYLPREDNISOLONE ACETATE 40 MG/ML IJ SUSP
40.0000 mg | Freq: Once | INTRAMUSCULAR | Status: AC
  Administered 2024-02-13: 40 mg

## 2024-02-13 NOTE — Progress Notes (Unsigned)
 Pain Scale   Average Pain 7 Patient advised she has chronic lower back pain radiating to her right leg, patient also states her pain at times wakes her up at night.         +Driver, -BT, -Dye Allergies.

## 2024-02-13 NOTE — Patient Instructions (Signed)

## 2024-02-15 ENCOUNTER — Ambulatory Visit: Admitting: Orthopaedic Surgery

## 2024-02-16 ENCOUNTER — Encounter: Payer: Self-pay | Admitting: Physical Medicine and Rehabilitation
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
## 2024-02-16 NOTE — Procedures (Signed)
 Lumbar Epidural Steroid Injection - Interlaminar Approach with Fluoroscopic Guidance  Patient: Misty Cruz      Date of Birth: 02/20/1961 MRN: [id number] PCP: Margarete Maeola DASEN, FNP      Visit Date: 02/13/2024   Universal Protocol:     Consent Given By: the patient  Position: PRONE  Additional Comments: Vital signs were monitored before and after the procedure. Patient was prepped and draped in the usual sterile fashion. The correct patient, procedure, and site was verified.   Injection Procedure Details:   Procedure diagnoses: Lumbar radiculopathy [M54.16]   Meds Administered:  Meds ordered this encounter  Medications   methylPREDNISolone  acetate (DEPO-MEDROL ) injection 40 mg     Laterality: Right  Location/Site:  L5-S1  Needle: 3.5 in., 20 ga. Tuohy  Needle Placement: Paramedian epidural  Findings:   -Comments: Excellent flow of contrast into the epidural space.  Procedure Details: Using a paramedian approach from the side mentioned above, the region overlying the inferior lamina was localized under fluoroscopic visualization and the soft tissues overlying this structure were infiltrated with 4 ml. of 1% Lidocaine  without Epinephrine. The Tuohy needle was inserted into the epidural space using a paramedian approach.   The epidural space was localized using loss of resistance along with counter oblique bi-planar fluoroscopic views.  After negative aspirate for air, blood, and CSF, a 2 ml. volume of Isovue -250 was injected into the epidural space and the flow of contrast was observed. Radiographs were obtained for documentation purposes.    The injectate was administered into the level noted above.   Additional Comments:  The patient tolerated the procedure well Dressing: 2 x 2 sterile gauze and Band-Aid    Post-procedure details: Patient was observed during the procedure. Post-procedure instructions were reviewed.  Patient left the clinic in stable  condition.

## 2024-02-17 ENCOUNTER — Encounter: Payer: Self-pay | Admitting: Urology

## 2024-02-17 ENCOUNTER — Ambulatory Visit: Admitting: Urology

## 2024-02-17 VITALS — BP 161/84 | HR 73

## 2024-02-17 DIAGNOSIS — N200 Calculus of kidney: Secondary | ICD-10-CM

## 2024-02-17 LAB — URINALYSIS, ROUTINE W REFLEX MICROSCOPIC
Bilirubin, UA: NEGATIVE
Glucose, UA: NEGATIVE
Ketones, UA: NEGATIVE
Leukocytes,UA: NEGATIVE
Nitrite, UA: NEGATIVE
Protein,UA: NEGATIVE
RBC, UA: NEGATIVE
Specific Gravity, UA: <1.005 — ABNORMAL LOW (ref 1.005–1.030)
Urobilinogen, Ur: 0.2 mg/dL (ref 0.2–1.0)
pH, UA: 6.5 (ref 5.0–7.5)

## 2024-02-17 NOTE — H&P (View-Only) (Signed)
 02/17/2024 10:56 AM   Misty Cruz 19-Jul-1961 [id number]  Referring provider: Margarete Maeola DASEN, FNP [address] [place],  [place] [address]  Followup nephrolithiasis  HPI: [name] is a 62yo here for followup for nephrolithiasis. No stone events since last visit. CT 6/11 shows 5-26mm right renal calculi and a 2-85mm left renal calculi. She denies any flank pain. No worsening LUTS. She is on urocitK 15meq BID.    PMH: Past Medical History:  Diagnosis Date   Diabetes mellitus    Hypercholesteremia    Hypertension    Thyroid disease     Surgical History: Past Surgical History:  Procedure Laterality Date   ABDOMINAL HYSTERECTOMY     BREAST BIOPSY Right 07/14/2023   MM RT BREAST BX W LOC DEV 1ST LESION IMAGE BX SPEC STEREO GUIDE 07/14/2023 [facility]   COLONOSCOPY N/A 06/04/2013   Procedure: COLONOSCOPY;  Surgeon: Misty Cruz Haddock, MD;  Location: [facility];  Service: Endoscopy;  Laterality: N/A;  9:30 AM   CYSTOSCOPY WITH RETROGRADE PYELOGRAM, URETEROSCOPY AND STENT PLACEMENT Bilateral 03/03/2023   Procedure: CYSTOSCOPY WITH RETROGRADE PYELOGRAM, URETEROSCOPY AND STENT PLACEMENT;  Surgeon: Misty Belvie LITTIE, MD;  Location: [facility];  Service: Urology;  Laterality: Bilateral;   HOLMIUM LASER APPLICATION Bilateral 03/03/2023   Procedure: HOLMIUM LASER APPLICATION;  Surgeon: Misty Belvie LITTIE, MD;  Location: [facility];  Service: Urology;  Laterality: Bilateral;   STONE EXTRACTION WITH BASKET Bilateral 03/03/2023   Procedure: STONE EXTRACTION WITH BASKET;  Surgeon: Misty Belvie LITTIE, MD;  Location: [facility];  Service: Urology;  Laterality: Bilateral;    Home Medications:  Allergies as of 02/17/2024   No Known Allergies      Medication List        Accurate as of February 17, 2024 10:56 AM. If you have any questions, ask your nurse or doctor.          atorvastatin 10 MG tablet Commonly known as: LIPITOR Take 10 mg by mouth in the morning.   BLACK ELDERBERRY  PO Take 2,000 mg by mouth in the morning.   cetirizine 10 MG tablet Commonly known as: ZYRTEC Take 10 mg by mouth in the morning.   cyclobenzaprine  10 MG tablet Commonly known as: FLEXERIL  Take 1 tablet (10 mg total) by mouth at bedtime. One tablet every night at bedtime as needed for spasm.   diazepam  5 MG tablet Commonly known as: VALIUM  Take one tablet by mouth with light food one hour prior to procedure.   glipiZIDE 10 MG tablet Commonly known as: GLUCOTROL Take 10 mg by mouth in the morning.   HYDROcodone -acetaminophen  5-325 MG tablet Commonly known as: NORCO/VICODIN 1 tablet as needed Orally every 6 hrs   levothyroxine 75 MCG tablet Commonly known as: SYNTHROID Take 75 mcg by mouth every morning.   lisinopril 20 MG tablet Commonly known as: ZESTRIL Take 20 mg by mouth in the morning.   metFORMIN 500 MG tablet Commonly known as: GLUCOPHAGE Take 500 mg by mouth 2 (two) times daily.   methocarbamol  500 MG tablet Commonly known as: ROBAXIN  Take 1 tablet (500 mg total) by mouth every 8 (eight) hours as needed for muscle spasms.   Multivitamin Tabs Take 1 tablet by mouth in the morning.   naproxen 500 MG tablet Commonly known as: NAPROSYN Take 500 mg by mouth 2 (two) times daily.   pioglitazone 15 MG tablet Commonly known as: ACTOS Take 15 mg by mouth in the morning.   Potassium  Citrate 15 MEQ (1620 MG) Tbcr Commonly known as: Urocit-K  15 Take 1 tablet by mouth 2 (two) times daily.   VITAMIN D PO Take 1 tablet by mouth in the morning.        Allergies: No Known Allergies  Family History: Family History  Problem Relation Age of Onset   Breast cancer Maternal Aunt    Colon cancer Neg Hx     Social History:  reports that she has quit smoking. Her smoking use included cigarettes. She has a 1.5 pack-year smoking history. She does not have any smokeless tobacco history on file. She reports that she does not drink alcohol and does not use  drugs.  ROS: All other review of systems were reviewed and are negative except what is noted above in HPI  Physical Exam: BP (!) 161/84   Pulse 73   LMP 09/06/2010   Constitutional:  Alert and oriented, No acute distress. HEENT: Misty Cruz AT, moist mucus membranes.  Trachea midline, no masses. Cardiovascular: No clubbing, cyanosis, or edema. Respiratory: Normal respiratory effort, no increased work of breathing. GI: Abdomen is soft, nontender, nondistended, no abdominal masses GU: No CVA tenderness.  Lymph: No cervical or inguinal lymphadenopathy. Skin: No rashes, bruises or suspicious lesions. Neurologic: Grossly intact, no focal deficits, moving all 4 extremities. Psychiatric: Normal mood and affect.  Laboratory Data: Lab Results  Component Value Date   WBC 7.1 02/05/2011   HGB 12.5 02/05/2011   HCT 35.2 (L) 02/05/2011   MCV 92.6 02/05/2011   PLT 194 02/05/2011    Lab Results  Component Value Date   CREATININE 0.75 08/03/2023    No results found for: PSA  No results found for: TESTOSTERONE  No results found for: HGBA1C  Urinalysis    Component Value Date/Time   COLORURINE YELLOW 09/03/2011 2215   APPEARANCEUR Clear 11/02/2023 1538   LABSPEC 1.020 09/03/2011 2215   PHURINE 6.0 09/03/2011 2215   GLUCOSEU 2+ (A) 11/02/2023 1538   HGBUR TRACE (A) 09/03/2011 2215   BILIRUBINUR Negative 11/02/2023 1538   KETONESUR NEGATIVE 09/03/2011 2215   PROTEINUR Negative 11/02/2023 1538   PROTEINUR NEGATIVE 09/03/2011 2215   UROBILINOGEN 0.2 09/03/2011 2215   NITRITE Negative 11/02/2023 1538   NITRITE NEGATIVE 09/03/2011 2215   LEUKOCYTESUR Negative 11/02/2023 1538    Lab Results  Component Value Date   LABMICR Comment 11/02/2023   WBCUA 6-10 (A) 03/09/2023   LABEPIT 0-10 03/09/2023   BACTERIA None seen 03/09/2023    Pertinent Imaging: Ct 02/01/2024: Images reviewed and discussed with the patient  Results for orders placed during the hospital encounter of  08/25/08  DG Abd 1 View  Narrative Clinical Data: Left flank pain.  The patient self removed a left ureteral stent earlier today. Recent left lithotripsy.  ABDOMEN - 1 VIEW 08/25/2008:  Comparison: Unenhanced CT abdomen pelvis 08/07/2008.  Findings: Tiny stone fragment projected over the expected location of the mid left kidney.  The calculi identified in the right kidney on the prior examination are no longer visualized.  No visible distal ureteral calculi on either side.  Small phleboliths low in the pelvis as noted on the prior CT.  Bilateral tubal ligation clips.  Large amount of stool throughout the colon from cecum to rectum.  No evidence of bowel obstruction or significant ileus.  IMPRESSION:  1.  Tiny stone fragment projected over the expected location of the left mid kidney.  No visible left ureteral calculi. 2.  No acute abdominal abnormality apart from probable constipation.  Provider: Luke Cocking  No results found for this or any previous visit.  No results found for this or any previous visit.  No results found for this or any previous visit.  Results for orders placed in visit on 04/27/23  Ultrasound renal complete  Narrative CLINICAL DATA:  Follow-up nephrolithiasis  EXAM: RENAL / URINARY TRACT ULTRASOUND COMPLETE  COMPARISON:  CT abdomen pelvis 12/01/2022  FINDINGS: Right Kidney:  Renal measurements: 11.9 x 5.2 x 5.0 cm = volume: 163.1 mL. Normal renal cortical thickness and echogenicity. No hydronephrosis. 6 mm stone.  Left Kidney:  Renal measurements: 6.4 x 3.4 x 3.7 cm = volume: 42.4 mL. 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 9 mm stone. Mild pelviectasis. No renal mass identified.  Bladder:  Appears normal for degree of bladder distention.  Other:  None.  IMPRESSION: 1. Bilateral nephrolithiasis. 2. Mild left pelviectasis. 3. Malrotated left kidney.   Electronically Signed By: Bard Moats M.D. On: 11/12/2023 15:52  No results found  for this or any previous visit.  Results for orders placed in visit on 12/01/22  CT HEMATURIA WORKUP  Narrative CLINICAL DATA:  Gross hematuria, left flank pain  EXAM: CT ABDOMEN AND PELVIS WITHOUT AND WITH CONTRAST  TECHNIQUE: Multidetector CT imaging of the abdomen and pelvis was performed following the standard protocol before and following the bolus administration of intravenous contrast.  RADIATION DOSE REDUCTION: This exam was performed according to the departmental dose-optimization program which includes automated exposure control, adjustment of the mA and/or kV according to patient size and/or use of iterative reconstruction technique.  CONTRAST:  125 mL Isovue -300 iodinated contrast IV  COMPARISON:  09/04/2011  FINDINGS: Lower chest: No acute abnormality.  Coronary artery calcifications.  Hepatobiliary: No focal liver abnormality is seen. Status post cholecystectomy. No biliary dilatation.  Pancreas: Unremarkable. No pancreatic ductal dilatation or surrounding inflammatory changes.  Spleen: Normal in size without significant abnormality.  Adrenals/Urinary Tract: Discoid left adrenal gland. Normal right adrenal gland. Small bilateral nonobstructive renal calculi. 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 Normal size and location of the right kidney. No ureteral calculi or hydronephrosis. Limited opacification of the right ureter. Within this limitation, no evidence of urinary tract filling defect on delayed phase imaging. Bladder is unremarkable.  Stomach/Bowel: Stomach is within normal limits. Appendix not clearly visualized. No evidence of bowel wall thickening, distention, or inflammatory changes. Moderate burden of stool throughout the colon.  Vascular/Lymphatic: Aortic atherosclerosis. Varices in the left upper quadrant (series 10, image 30). No enlarged abdominal or pelvic lymph  nodes.  Reproductive: No mass or other significant abnormality. The uterus is present despite stated history of hysterectomy.  Other: No abdominal wall hernia or abnormality. No ascites. 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  Musculoskeletal: No acute or significant osseous findings.  IMPRESSION: 1. Small bilateral nonobstructive renal calculi. No ureteral calculi or hydronephrosis. 2. 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 3. Normal size and location of the right kidney. 4. Limited opacification of the right ureter. Within this limitation, no evidence of urinary tract filling defect on delayed phase imaging. 5. 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  Aortic Atherosclerosis (ICD10-I70.0).   Electronically Signed By: Marolyn JONETTA Jaksch M.D. On: 12/07/2022 17:12  Results for orders placed during the hospital encounter of 02/01/24  CT RENAL STONE STUDY  Narrative CLINICAL DATA:  Status post nephrolithiasis treatment.  EXAM:  CT ABDOMEN AND PELVIS WITHOUT CONTRAST  TECHNIQUE: Multidetector CT imaging of the abdomen and pelvis was performed following the standard protocol without IV contrast.  RADIATION DOSE REDUCTION: This exam was performed according to the departmental dose-optimization program which includes automated exposure control, adjustment of the mA and/or kV according to patient size and/or use of iterative reconstruction technique.  COMPARISON:  12/07/2022.  FINDINGS: Lower chest: No acute abnormality. No pleural or pericardial effusions noted.  Hepatobiliary: No focal liver abnormality is seen. No gallstones, gallbladder wall thickening, or biliary dilatation.  Pancreas: Unremarkable. No pancreatic ductal dilatation or surrounding inflammatory changes.  Spleen: Normal in size  without focal abnormality.  Adrenals/Urinary Tract: Left kidney appears dysplastic. There are numerous small mm sized left renal stones without hydronephrosis. Right kidney stones measuring 7 mm and 3 mm. No hydronephrosis. No adrenal lesions.  Stomach/Bowel: Stomach is within normal limits. Appendix not seen and no evidence of appendicitis. No evidence of bowel wall thickening, distention, or inflammatory changes.  Vascular/Lymphatic: Aortic atherosclerosis. No enlarged abdominal or pelvic lymph nodes.  Reproductive: Uterus and bilateral adnexa are unremarkable.  Other: No abdominal wall hernia or abnormality. No abdominopelvic ascites.  Musculoskeletal: Osteopenia. Thoracolumbosacral degenerative disc disease.  IMPRESSION: 1. Bilateral nephrolithiasis. No obstructive uropathy. 2. 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 3. Aortic atherosclerosis (ICD10-I70.0).   Electronically Signed By: Fonda Field M.D. On: 02/05/2024 17:11   Assessment & Plan:    1. Nephrolithiasis (Primary) -We discussed the management of kidney stones. These options include observation, ureteroscopy, shockwave lithotripsy (ESWL) and percutaneous nephrolithotomy (PCNL). We discussed which options are relevant to the patient's stone(s). We discussed the natural history of kidney stones as well as the complications of untreated stones and the impact on quality of life without treatment as well as with each of the above listed treatments. We also discussed the efficacy of each treatment in its ability to clear the stone burden. With any of these management options I discussed the signs and symptoms of infection and the need for emergent treatment should these be experienced. For each option we discussed the ability of each procedure to clear the patient of their stone burden.   For observation I described the risks which include but are not limited to silent renal damage, life-threatening  infection, need for emergent surgery, failure to pass stone and pain.   For ureteroscopy I described the risks which include bleeding, infection, damage to contiguous structures, positioning injury, ureteral stricture, ureteral avulsion, ureteral injury, need for prolonged ureteral stent, inability to perform ureteroscopy, need for an interval procedure, inability to clear stone burden, stent discomfort/pain, heart attack, stroke, pulmonary embolus and the inherent risks with general anesthesia.   For shockwave lithotripsy I described the risks which include arrhythmia, kidney contusion, kidney hemorrhage, need for transfusion, pain, inability to adequately break up stone, inability to pass stone fragments, Steinstrasse, infection associated with obstructing stones, need for alternate surgical procedure, need for repeat shockwave lithotripsy, MI, CVA, PE and the inherent risks with anesthesia/conscious sedation.   For PCNL I described the risks including positioning injury, pneumothorax, hydrothorax, need for chest tube, inability to clear stone burden, renal laceration, arterial venous fistula or malformation, need for embolization of kidney, loss of kidney or renal function, need for repeat procedure, need for prolonged nephrostomy tube, ureteral avulsion, MI, CVA, PE and the inherent risks of general anesthesia.   - The patient would like to proceed with  - Urinalysis, Routine w reflex microscopic   No follow-ups on  file.  [name], MD  [facility] [place]

## 2024-02-17 NOTE — Patient Instructions (Signed)

## 2024-02-17 NOTE — Progress Notes (Signed)
 02/17/2024 10:56 AM   Misty Cruz 19-Jul-1961 [id number]  Referring provider: Margarete Maeola DASEN, FNP [address] [place],  [place] [address]  Followup nephrolithiasis  HPI: Misty Cruz is a 62yo here for followup for nephrolithiasis. No stone events since last visit. CT 6/11 shows 5-26mm right renal calculi and a 2-85mm left renal calculi. She denies any flank pain. No worsening LUTS. She is on urocitK 15meq BID.    PMH: Past Medical History:  Diagnosis Date   Diabetes mellitus    Hypercholesteremia    Hypertension    Thyroid disease     Surgical History: Past Surgical History:  Procedure Laterality Date   ABDOMINAL HYSTERECTOMY     BREAST BIOPSY Right 07/14/2023   MM RT BREAST BX W LOC DEV 1ST LESION IMAGE BX SPEC STEREO GUIDE 07/14/2023 [facility]   COLONOSCOPY N/A 06/04/2013   Procedure: COLONOSCOPY;  Surgeon: Margo LITTIE Haddock, MD;  Location: [facility];  Service: Endoscopy;  Laterality: N/A;  9:30 AM   CYSTOSCOPY WITH RETROGRADE PYELOGRAM, URETEROSCOPY AND STENT PLACEMENT Bilateral 03/03/2023   Procedure: CYSTOSCOPY WITH RETROGRADE PYELOGRAM, URETEROSCOPY AND STENT PLACEMENT;  Surgeon: Sherrilee Belvie LITTIE, MD;  Location: [facility];  Service: Urology;  Laterality: Bilateral;   HOLMIUM LASER APPLICATION Bilateral 03/03/2023   Procedure: HOLMIUM LASER APPLICATION;  Surgeon: Sherrilee Belvie LITTIE, MD;  Location: [facility];  Service: Urology;  Laterality: Bilateral;   STONE EXTRACTION WITH BASKET Bilateral 03/03/2023   Procedure: STONE EXTRACTION WITH BASKET;  Surgeon: Sherrilee Belvie LITTIE, MD;  Location: [facility];  Service: Urology;  Laterality: Bilateral;    Home Medications:  Allergies as of 02/17/2024   No Known Allergies      Medication List        Accurate as of February 17, 2024 10:56 AM. If you have any questions, ask your nurse or doctor.          atorvastatin 10 MG tablet Commonly known as: LIPITOR Take 10 mg by mouth in the morning.   BLACK ELDERBERRY  PO Take 2,000 mg by mouth in the morning.   cetirizine 10 MG tablet Commonly known as: ZYRTEC Take 10 mg by mouth in the morning.   cyclobenzaprine  10 MG tablet Commonly known as: FLEXERIL  Take 1 tablet (10 mg total) by mouth at bedtime. One tablet every night at bedtime as needed for spasm.   diazepam  5 MG tablet Commonly known as: VALIUM  Take one tablet by mouth with light food one hour prior to procedure.   glipiZIDE 10 MG tablet Commonly known as: GLUCOTROL Take 10 mg by mouth in the morning.   HYDROcodone -acetaminophen  5-325 MG tablet Commonly known as: NORCO/VICODIN 1 tablet as needed Orally every 6 hrs   levothyroxine 75 MCG tablet Commonly known as: SYNTHROID Take 75 mcg by mouth every morning.   lisinopril 20 MG tablet Commonly known as: ZESTRIL Take 20 mg by mouth in the morning.   metFORMIN 500 MG tablet Commonly known as: GLUCOPHAGE Take 500 mg by mouth 2 (two) times daily.   methocarbamol  500 MG tablet Commonly known as: ROBAXIN  Take 1 tablet (500 mg total) by mouth every 8 (eight) hours as needed for muscle spasms.   Multivitamin Tabs Take 1 tablet by mouth in the morning.   naproxen 500 MG tablet Commonly known as: NAPROSYN Take 500 mg by mouth 2 (two) times daily.   pioglitazone 15 MG tablet Commonly known as: ACTOS Take 15 mg by mouth in the morning.   Potassium  Citrate 15 MEQ (1620 MG) Tbcr Commonly known as: Urocit-K  15 Take 1 tablet by mouth 2 (two) times daily.   VITAMIN D PO Take 1 tablet by mouth in the morning.        Allergies: No Known Allergies  Family History: Family History  Problem Relation Age of Onset   Breast cancer Maternal Aunt    Colon cancer Neg Hx     Social History:  reports that she has quit smoking. Her smoking use included cigarettes. She has a 1.5 pack-year smoking history. She does not have any smokeless tobacco history on file. She reports that she does not drink alcohol and does not use  drugs.  ROS: All other review of systems were reviewed and are negative except what is noted above in HPI  Physical Exam: BP (!) 161/84   Pulse 73   LMP 09/06/2010   Constitutional:  Alert and oriented, No acute distress. HEENT: [place] AT, moist mucus membranes.  Trachea midline, no masses. Cardiovascular: No clubbing, cyanosis, or edema. Respiratory: Normal respiratory effort, no increased work of breathing. GI: Abdomen is soft, nontender, nondistended, no abdominal masses GU: No CVA tenderness.  Lymph: No cervical or inguinal lymphadenopathy. Skin: No rashes, bruises or suspicious lesions. Neurologic: Grossly intact, no focal deficits, moving all 4 extremities. Psychiatric: Normal mood and affect.  Laboratory Data: Lab Results  Component Value Date   WBC 7.1 02/05/2011   HGB 12.5 02/05/2011   HCT 35.2 (L) 02/05/2011   MCV 92.6 02/05/2011   PLT 194 02/05/2011    Lab Results  Component Value Date   CREATININE 0.75 08/03/2023    No results found for: PSA  No results found for: TESTOSTERONE  No results found for: HGBA1C  Urinalysis    Component Value Date/Time   COLORURINE YELLOW 09/03/2011 2215   APPEARANCEUR Clear 11/02/2023 1538   LABSPEC 1.020 09/03/2011 2215   PHURINE 6.0 09/03/2011 2215   GLUCOSEU 2+ (A) 11/02/2023 1538   HGBUR TRACE (A) 09/03/2011 2215   BILIRUBINUR Negative 11/02/2023 1538   KETONESUR NEGATIVE 09/03/2011 2215   PROTEINUR Negative 11/02/2023 1538   PROTEINUR NEGATIVE 09/03/2011 2215   UROBILINOGEN 0.2 09/03/2011 2215   NITRITE Negative 11/02/2023 1538   NITRITE NEGATIVE 09/03/2011 2215   LEUKOCYTESUR Negative 11/02/2023 1538    Lab Results  Component Value Date   LABMICR Comment 11/02/2023   WBCUA 6-10 (A) 03/09/2023   LABEPIT 0-10 03/09/2023   BACTERIA None seen 03/09/2023    Pertinent Imaging: Ct 02/01/2024: Images reviewed and discussed with the patient  Results for orders placed during the hospital encounter of  08/25/08  DG Abd 1 View  Narrative Clinical Data: Left flank pain.  The patient self removed a left ureteral stent earlier today. Recent left lithotripsy.  ABDOMEN - 1 VIEW 08/25/2008:  Comparison: Unenhanced CT abdomen pelvis 08/07/2008.  Findings: Tiny stone fragment projected over the expected location of the mid left kidney.  The calculi identified in the right kidney on the prior examination are no longer visualized.  No visible distal ureteral calculi on either side.  Small phleboliths low in the pelvis as noted on the prior CT.  Bilateral tubal ligation clips.  Large amount of stool throughout the colon from cecum to rectum.  No evidence of bowel obstruction or significant ileus.  IMPRESSION:  1.  Tiny stone fragment projected over the expected location of the left mid kidney.  No visible left ureteral calculi. 2.  No acute abdominal abnormality apart from probable constipation.  Provider: Luke Cocking  No results found for this or any previous visit.  No results found for this or any previous visit.  No results found for this or any previous visit.  Results for orders placed in visit on 04/27/23  Ultrasound renal complete  Narrative CLINICAL DATA:  Follow-up nephrolithiasis  EXAM: RENAL / URINARY TRACT ULTRASOUND COMPLETE  COMPARISON:  CT abdomen pelvis 12/01/2022  FINDINGS: Right Kidney:  Renal measurements: 11.9 x 5.2 x 5.0 cm = volume: 163.1 mL. Normal renal cortical thickness and echogenicity. No hydronephrosis. 6 mm stone.  Left Kidney:  Renal measurements: 6.4 x 3.4 x 3.7 cm = volume: 42.4 mL. 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 9 mm stone. Mild pelviectasis. No renal mass identified.  Bladder:  Appears normal for degree of bladder distention.  Other:  None.  IMPRESSION: 1. Bilateral nephrolithiasis. 2. Mild left pelviectasis. 3. Malrotated left kidney.   Electronically Signed By: Bard Moats M.D. On: 11/12/2023 15:52  No results found  for this or any previous visit.  Results for orders placed in visit on 12/01/22  CT HEMATURIA WORKUP  Narrative CLINICAL DATA:  Gross hematuria, left flank pain  EXAM: CT ABDOMEN AND PELVIS WITHOUT AND WITH CONTRAST  TECHNIQUE: Multidetector CT imaging of the abdomen and pelvis was performed following the standard protocol before and following the bolus administration of intravenous contrast.  RADIATION DOSE REDUCTION: This exam was performed according to the departmental dose-optimization program which includes automated exposure control, adjustment of the mA and/or kV according to patient size and/or use of iterative reconstruction technique.  CONTRAST:  125 mL Isovue -300 iodinated contrast IV  COMPARISON:  09/04/2011  FINDINGS: Lower chest: No acute abnormality.  Coronary artery calcifications.  Hepatobiliary: No focal liver abnormality is seen. Status post cholecystectomy. No biliary dilatation.  Pancreas: Unremarkable. No pancreatic ductal dilatation or surrounding inflammatory changes.  Spleen: Normal in size without significant abnormality.  Adrenals/Urinary Tract: Discoid left adrenal gland. Normal right adrenal gland. Small bilateral nonobstructive renal calculi. 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 Normal size and location of the right kidney. No ureteral calculi or hydronephrosis. Limited opacification of the right ureter. Within this limitation, no evidence of urinary tract filling defect on delayed phase imaging. Bladder is unremarkable.  Stomach/Bowel: Stomach is within normal limits. Appendix not clearly visualized. No evidence of bowel wall thickening, distention, or inflammatory changes. Moderate burden of stool throughout the colon.  Vascular/Lymphatic: Aortic atherosclerosis. Varices in the left upper quadrant (series 10, image 30). No enlarged abdominal or pelvic lymph  nodes.  Reproductive: No mass or other significant abnormality. The uterus is present despite stated history of hysterectomy.  Other: No abdominal wall hernia or abnormality. No ascites. 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  Musculoskeletal: No acute or significant osseous findings.  IMPRESSION: 1. Small bilateral nonobstructive renal calculi. No ureteral calculi or hydronephrosis. 2. 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 3. Normal size and location of the right kidney. 4. Limited opacification of the right ureter. Within this limitation, no evidence of urinary tract filling defect on delayed phase imaging. 5. 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  Aortic Atherosclerosis (ICD10-I70.0).   Electronically Signed By: Marolyn JONETTA Jaksch M.D. On: 12/07/2022 17:12  Results for orders placed during the hospital encounter of 02/01/24  CT RENAL STONE STUDY  Narrative CLINICAL DATA:  Status post nephrolithiasis treatment.  EXAM:  CT ABDOMEN AND PELVIS WITHOUT CONTRAST  TECHNIQUE: Multidetector CT imaging of the abdomen and pelvis was performed following the standard protocol without IV contrast.  RADIATION DOSE REDUCTION: This exam was performed according to the departmental dose-optimization program which includes automated exposure control, adjustment of the mA and/or kV according to patient size and/or use of iterative reconstruction technique.  COMPARISON:  12/07/2022.  FINDINGS: Lower chest: No acute abnormality. No pleural or pericardial effusions noted.  Hepatobiliary: No focal liver abnormality is seen. No gallstones, gallbladder wall thickening, or biliary dilatation.  Pancreas: Unremarkable. No pancreatic ductal dilatation or surrounding inflammatory changes.  Spleen: Normal in size  without focal abnormality.  Adrenals/Urinary Tract: Left kidney appears dysplastic. There are numerous small mm sized left renal stones without hydronephrosis. Right kidney stones measuring 7 mm and 3 mm. No hydronephrosis. No adrenal lesions.  Stomach/Bowel: Stomach is within normal limits. Appendix not seen and no evidence of appendicitis. No evidence of bowel wall thickening, distention, or inflammatory changes.  Vascular/Lymphatic: Aortic atherosclerosis. No enlarged abdominal or pelvic lymph nodes.  Reproductive: Uterus and bilateral adnexa are unremarkable.  Other: No abdominal wall hernia or abnormality. No abdominopelvic ascites.  Musculoskeletal: Osteopenia. Thoracolumbosacral degenerative disc disease.  IMPRESSION: 1. Bilateral nephrolithiasis. No obstructive uropathy. 2. 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 3. Aortic atherosclerosis (ICD10-I70.0).   Electronically Signed By: Fonda Field M.D. On: 02/05/2024 17:11   Assessment & Plan:    1. Nephrolithiasis (Primary) -We discussed the management of kidney stones. These options include observation, ureteroscopy, shockwave lithotripsy (ESWL) and percutaneous nephrolithotomy (PCNL). We discussed which options are relevant to the patient's stone(s). We discussed the natural history of kidney stones as well as the complications of untreated stones and the impact on quality of life without treatment as well as with each of the above listed treatments. We also discussed the efficacy of each treatment in its ability to clear the stone burden. With any of these management options I discussed the signs and symptoms of infection and the need for emergent treatment should these be experienced. For each option we discussed the ability of each procedure to clear the patient of their stone burden.   For observation I described the risks which include but are not limited to silent renal damage, life-threatening  infection, need for emergent surgery, failure to pass stone and pain.   For ureteroscopy I described the risks which include bleeding, infection, damage to contiguous structures, positioning injury, ureteral stricture, ureteral avulsion, ureteral injury, need for prolonged ureteral stent, inability to perform ureteroscopy, need for an interval procedure, inability to clear stone burden, stent discomfort/pain, heart attack, stroke, pulmonary embolus and the inherent risks with general anesthesia.   For shockwave lithotripsy I described the risks which include arrhythmia, kidney contusion, kidney hemorrhage, need for transfusion, pain, inability to adequately break up stone, inability to pass stone fragments, Steinstrasse, infection associated with obstructing stones, need for alternate surgical procedure, need for repeat shockwave lithotripsy, MI, CVA, PE and the inherent risks with anesthesia/conscious sedation.   For PCNL I described the risks including positioning injury, pneumothorax, hydrothorax, need for chest tube, inability to clear stone burden, renal laceration, arterial venous fistula or malformation, need for embolization of kidney, loss of kidney or renal function, need for repeat procedure, need for prolonged nephrostomy tube, ureteral avulsion, MI, CVA, PE and the inherent risks of general anesthesia.   - The patient would like to proceed with  - Urinalysis, Routine w reflex microscopic   No follow-ups on  file.  [name], MD  [facility] [place]

## 2024-02-20 ENCOUNTER — Telehealth: Payer: Self-pay | Admitting: Urology

## 2024-02-20 NOTE — Telephone Encounter (Signed)
 [employer] will start tomorrow July 1

## 2024-02-22 ENCOUNTER — Ambulatory Visit: Admitting: Orthopaedic Surgery

## 2024-02-22 ENCOUNTER — Encounter: Payer: Self-pay | Admitting: Orthopaedic Surgery

## 2024-02-22 VITALS — BP 166/73 | HR 58

## 2024-02-22 DIAGNOSIS — M25512 Pain in left shoulder: Secondary | ICD-10-CM

## 2024-02-22 DIAGNOSIS — G8929 Other chronic pain: Secondary | ICD-10-CM

## 2024-02-22 NOTE — Patient Instructions (Signed)
 MRI has been ordered, if you do not hear from anyone about scheduling the MRI please call [phone].   Follow up with Dr. Brenna in 3 weeks

## 2024-02-22 NOTE — Progress Notes (Signed)
 My shoulder is worse.  She has more pain in the left shoulder.  Nothing seems to help.  She is to have surgery on the left kidney soon for kidney stones.  She has rash on left neck that may be early shingles she says according to another provider.  That can cause shoulder pain also but she has had more pain recently without the rash.  I would like to get a MRI of the shoulder as she has not improved.  Left shoulder has decreased motion with pain in the extremes.  NV intact.  No swelling, no redness.  Grips good.  Encounter Diagnosis  Name Primary?   Chronic left shoulder pain Yes   Get MRI of the shoulder but do the other medical issues first.  Return after MRI.  Call if any problem.  Precautions discussed.  Electronically Signed Lemond Stable, MD 7/2/202510:59 AM

## 2024-02-26 ENCOUNTER — Ambulatory Visit (HOSPITAL_COMMUNITY)

## 2024-02-27 ENCOUNTER — Ambulatory Visit (HOSPITAL_COMMUNITY)

## 2024-02-27 NOTE — Patient Instructions (Signed)
 Misty Cruz  02/27/2024     @PREFPERIOPPHARMACY @   Your procedure is scheduled on 03/01/2024.    Report to [facility] at  1000 A.M.   Call this number if you have problems the morning of surgery:  [phone]  If you experience any cold or flu symptoms such as cough, fever, chills, shortness of breath, etc. between now and your scheduled surgery, please notify us  at the above number.   Remember:        DO NOT take any medications for diabetes the morning of your procedure.   Do not eat after midnight.   You may drink clear liquids until 0800 am on 03/01/2024.    Clear liquids allowed are:                    Water , Juice (No red color; non-citric and without pulp; diabetics please choose diet or no sugar options), Carbonated beverages (diabetics please choose diet or no sugar options), Clear Tea (No creamer, milk, or cream, including half & half and powdered creamer), Black Coffee Only (No creamer, milk or cream, including half & half and powdered creamer), and Clear Sports drink (No red color; diabetics please choose diet or no sugar options)    Take these medicines the morning of surgery with A SIP OF WATER                             levothyroxine, methocarbamol .    Do not wear jewelry, make-up or nail polish, including gel polish,  artificial nails, or any other type of covering on natural nails (fingers and  toes).  Do not wear lotions, powders, or perfumes, or deodorant.  Do not shave 48 hours prior to surgery.  Men may shave face and neck.  Do not bring valuables to the hospital.  [facility] is not responsible for any belongings or valuables.  Contacts, dentures or bridgework may not be worn into surgery.  Leave your suitcase in the car.  After surgery it may be brought to your room.  For patients admitted to the hospital, discharge time will be determined by your treatment team.  Patients discharged the day of surgery will not be allowed to drive home  and must have someone with them for 24 hours.    Special instructions:   DO NOT smoke tobacco or vape for 24 hours before your procedure.  Please read over the following fact sheets that you were given. Anesthesia Post-op Instructions and Care and Recovery After Surgery      Ureteral Stent Implantation, Care After The following information offers guidance on how to care for yourself after your procedure. Your health care provider may also give you more specific instructions. If you have problems or questions, contact your health care provider. What can I expect after the procedure? After the procedure, it is common to have: Nausea. Mild pain when you urinate. You may feel this pain in your lower back or lower abdomen. The pain should stop within a few minutes after you urinate. This pattern may last for up to 1 week. A small amount of blood in your urine for several days. Follow these instructions at home: Medicines Take over-the-counter and prescription medicines only as told by your health care provider. If you were prescribed antibiotics, take them as told by your health care provider. Do not stop using the antibiotic even if you start to  feel better. If you were given a sedative during the procedure, it can affect you for several hours. Do not drive or operate machinery until your health care provider says that it is safe. Ask your health care provider if the medicine prescribed to you: Requires you to avoid driving or using machinery. Can cause constipation. You may need to take these actions to prevent or treat constipation: Take over-the-counter or prescription medicines. Eat foods that are high in fiber, such as beans, whole grains, and fresh fruits and vegetables. Limit foods that are high in fat and processed sugars, such as fried or sweet foods. Activity Rest as told by your health care provider. Do not sit for a long time without moving. Get up to take short walks every 1-2  hours. This will improve blood flow and breathing. Ask for help if you feel weak or unsteady. Return to your normal activities as told by your health care provider. Ask your health care provider what activities are safe for you. General instructions  If you have a catheter: Follow instructions from your health care provider about taking care of your catheter and collection bag. Do not take baths, swim, or use a hot tub until your health care provider approves. Ask your health care provider if you may take showers. You may only be allowed to take sponge baths. Drink enough fluid to keep your urine pale yellow. Do not use any products that contain nicotine or tobacco. These products include cigarettes, chewing tobacco, and vaping devices, such as e-cigarettes. These can delay healing after surgery. If you need help quitting, ask your health care provider. Keep all follow-up visits. Contact a health care provider if: You start passing blood clots, or you have more than a small amount of blood in your urine. You have pain that gets worse or does not get better with medicine, especially pain when you urinate. You have trouble urinating. You feel nauseous or you vomit again and again during a period of more than 2 days after the procedure. You have a fever. Get help right away if: You are passing blood clots that are 1 inch (2.5 cm) or larger in size. You are leaking urine (have incontinence), or you cannot urinate. The end of the stent comes out of your urethra. You have sudden, sharp, or severe pain in your abdomen or lower back. You have swelling or pain in your legs. You have trouble breathing. These symptoms may be an emergency. Get help right away. Call 911. Do not wait to see if the symptoms will go away. Do not drive yourself to the hospital. Summary After the procedure, it is common to have mild pain when you urinate that goes away within a few minutes after you urinate. This may last  for up to 1 week. Take over-the-counter and prescription medicines only as told by your health care provider. Drink enough fluid to keep your urine pale yellow. Call your health care provider if you start passing blood clots, or you have more than a small amount of blood in your urine. This information is not intended to replace advice given to you by your health care provider. Make sure you discuss any questions you have with your health care provider. Document Revised: 09/14/2021 Document Reviewed: 09/14/2021 Elsevier Patient Education  2024 [employer]General Anesthesia, Adult, Care After The following information offers guidance on how to care for yourself after your procedure. Your health care provider may also give you more specific instructions. If you  have problems or questions, contact your health care provider. What can I expect after the procedure? After the procedure, it is common for people to: Have pain or discomfort at the IV site. Have nausea or vomiting. Have a sore throat or hoarseness. Have trouble concentrating. Feel cold or chills. Feel weak, sleepy, or tired (fatigue). Have soreness and body aches. These can affect parts of the body that were not involved in surgery. Follow these instructions at home: For the time period you were told by your health care provider:  Rest. Do not participate in activities where you could fall or become injured. Do not drive or use machinery. Do not drink alcohol. Do not take sleeping pills or medicines that cause drowsiness. Do not make important decisions or sign legal documents. Do not take care of children on your own. General instructions Drink enough fluid to keep your urine pale yellow. If you have sleep apnea, surgery and certain medicines can increase your risk for breathing problems. Follow instructions from your health care provider about wearing your sleep device: Anytime you are sleeping, including during daytime  naps. While taking prescription pain medicines, sleeping medicines, or medicines that make you drowsy. Return to your normal activities as told by your health care provider. Ask your health care provider what activities are safe for you. Take over-the-counter and prescription medicines only as told by your health care provider. Do not use any products that contain nicotine or tobacco. These products include cigarettes, chewing tobacco, and vaping devices, such as e-cigarettes. These can delay incision healing after surgery. If you need help quitting, ask your health care provider. Contact a health care provider if: You have nausea or vomiting that does not get better with medicine. You vomit every time you eat or drink. You have pain that does not get better with medicine. You cannot urinate or have bloody urine. You develop a skin rash. You have a fever. Get help right away if: You have trouble breathing. You have chest pain. You vomit blood. These symptoms may be an emergency. Get help right away. Call 911. Do not wait to see if the symptoms will go away. Do not drive yourself to the hospital. Summary After the procedure, it is common to have a sore throat, hoarseness, nausea, vomiting, or to feel weak, sleepy, or fatigue. For the time period you were told by your health care provider, do not drive or use machinery. Get help right away if you have difficulty breathing, have chest pain, or vomit blood. These symptoms may be an emergency. This information is not intended to replace advice given to you by your health care provider. Make sure you discuss any questions you have with your health care provider. Document Revised: 11/06/2021 Document Reviewed: 11/06/2021 Elsevier Patient Education  2024 [employer].

## 2024-02-28 ENCOUNTER — Encounter (HOSPITAL_COMMUNITY)
Admission: RE | Admit: 2024-02-28 | Discharge: 2024-02-28 | Disposition: A | Discharge status: Home / Self Care | Source: Ambulatory Visit | Attending: Urology | Admitting: Urology

## 2024-02-28 ENCOUNTER — Other Ambulatory Visit: Payer: Self-pay

## 2024-02-28 ENCOUNTER — Encounter (HOSPITAL_COMMUNITY): Payer: Self-pay

## 2024-02-28 VITALS — BP 159/77 | HR 59 | Resp 18 | Ht 59.0 in | Wt 152.0 lb

## 2024-02-28 DIAGNOSIS — E119 Type 2 diabetes mellitus without complications: Secondary | ICD-10-CM | POA: Diagnosis not present

## 2024-02-28 DIAGNOSIS — I10 Essential (primary) hypertension: Secondary | ICD-10-CM | POA: Insufficient documentation

## 2024-02-28 DIAGNOSIS — Z01818 Encounter for other preprocedural examination: Secondary | ICD-10-CM | POA: Insufficient documentation

## 2024-02-28 HISTORY — DX: Thyrotoxicosis, unspecified without thyrotoxic crisis or storm: E05.90

## 2024-02-28 HISTORY — DX: Personal history of urinary calculi: Z87.442

## 2024-02-28 LAB — BASIC METABOLIC PANEL WITH GFR
Anion gap: 7 (ref 5–15)
BUN: 23 mg/dL (ref 8–23)
CO2: 23 mmol/L (ref 22–32)
Calcium: 9.1 mg/dL (ref 8.9–10.3)
Chloride: 104 mmol/L (ref 98–111)
Creatinine, Ser: 0.73 mg/dL (ref 0.44–1.00)
GFR, Estimated: >60 mL/min (ref >60)
Glucose, Bld: 172 mg/dL — ABNORMAL HIGH (ref 70–99)
Potassium: 4.6 mmol/L (ref 3.5–5.1)
Sodium: 134 mmol/L — ABNORMAL LOW (ref 135–145)

## 2024-02-29 LAB — HEMOGLOBIN A1C
Hgb A1c MFr Bld: 5.3 % (ref 4.8–5.6)
Mean Plasma Glucose: 105 mg/dL

## 2024-03-01 ENCOUNTER — Encounter (HOSPITAL_COMMUNITY)
Admission: RE | Disposition: A | Discharge status: Home / Self Care | Payer: Self-pay | Source: Home / Self Care | Attending: Urology

## 2024-03-01 ENCOUNTER — Ambulatory Visit (HOSPITAL_COMMUNITY)
Admission: RE | Admit: 2024-03-01 | Discharge: 2024-03-01 | Disposition: A | Discharge status: Home / Self Care | Attending: Urology | Admitting: Urology

## 2024-03-01 ENCOUNTER — Other Ambulatory Visit: Payer: Self-pay

## 2024-03-01 ENCOUNTER — Ambulatory Visit (HOSPITAL_COMMUNITY): Admitting: Anesthesiology

## 2024-03-01 ENCOUNTER — Ambulatory Visit (HOSPITAL_COMMUNITY)

## 2024-03-01 ENCOUNTER — Encounter (HOSPITAL_COMMUNITY): Payer: Self-pay | Admitting: Urology

## 2024-03-01 DIAGNOSIS — E119 Type 2 diabetes mellitus without complications: Secondary | ICD-10-CM | POA: Insufficient documentation

## 2024-03-01 DIAGNOSIS — Z87891 Personal history of nicotine dependence: Secondary | ICD-10-CM | POA: Diagnosis not present

## 2024-03-01 DIAGNOSIS — N200 Calculus of kidney: Secondary | ICD-10-CM

## 2024-03-01 DIAGNOSIS — Z7984 Long term (current) use of oral hypoglycemic drugs: Secondary | ICD-10-CM | POA: Diagnosis not present

## 2024-03-01 DIAGNOSIS — I10 Essential (primary) hypertension: Secondary | ICD-10-CM | POA: Insufficient documentation

## 2024-03-01 DIAGNOSIS — E079 Disorder of thyroid, unspecified: Secondary | ICD-10-CM | POA: Diagnosis not present

## 2024-03-01 HISTORY — PX: CYSTOSCOPY/RETROGRADE/URETEROSCOPY/STONE EXTRACTION WITH BASKET: SHX5317

## 2024-03-01 HISTORY — PX: CYSTOSCOPY/URETEROSCOPY/HOLMIUM LASER/STENT PLACEMENT: SHX6546

## 2024-03-01 HISTORY — PX: HOLMIUM LASER APPLICATION: SHX5852

## 2024-03-01 LAB — GLUCOSE, CAPILLARY
Glucose-Capillary: 122 mg/dL — ABNORMAL HIGH (ref 70–99)
Glucose-Capillary: 167 mg/dL — ABNORMAL HIGH (ref 70–99)

## 2024-03-01 SURGERY — CYSTOSCOPY/URETEROSCOPY/HOLMIUM LASER/STENT PLACEMENT
Anesthesia: General | Site: Ureter | Laterality: Bilateral

## 2024-03-01 MED ORDER — ONDANSETRON HCL 4 MG/2ML IJ SOLN
INTRAMUSCULAR | Status: AC
Start: 2024-03-01 — End: 2024-03-01
  Filled 2024-03-01: qty 4

## 2024-03-01 MED ORDER — OXYCODONE HCL 5 MG/5ML PO SOLN: 5.0000 mg | Freq: Once | ORAL | Status: AC | PRN

## 2024-03-01 MED ORDER — DEXAMETHASONE SODIUM PHOSPHATE 10 MG/ML IJ SOLN
INTRAMUSCULAR | Status: AC
  Filled 2024-03-01: qty 1

## 2024-03-01 MED ORDER — LIDOCAINE 2% (20 MG/ML) 5 ML SYRINGE
INTRAMUSCULAR | Status: AC
  Filled 2024-03-01: qty 10

## 2024-03-01 MED ORDER — SEVOFLURANE IN SOLN
RESPIRATORY_TRACT | Status: AC
Start: 2024-03-01 — End: 2024-03-01
  Filled 2024-03-01: qty 500

## 2024-03-01 MED ORDER — DIATRIZOATE MEGLUMINE 30 % UR SOLN
URETHRAL | Status: DC | PRN
  Administered 2024-03-01: 13 mL via URETHRAL

## 2024-03-01 MED ORDER — LACTATED RINGERS IV SOLN: INTRAVENOUS | Status: DC | PRN

## 2024-03-01 MED ORDER — DIATRIZOATE MEGLUMINE 30 % UR SOLN
URETHRAL | Status: AC
Start: 2024-03-01 — End: 2024-03-01
  Filled 2024-03-01: qty 100

## 2024-03-01 MED ORDER — FENTANYL CITRATE PF 50 MCG/ML IJ SOSY
25.0000 ug | PREFILLED_SYRINGE | INTRAMUSCULAR | Status: DC | PRN
  Administered 2024-03-01: 50 ug via INTRAVENOUS

## 2024-03-01 MED ORDER — ONDANSETRON HCL 4 MG/2ML IJ SOLN
INTRAMUSCULAR | Status: AC
  Filled 2024-03-01: qty 2

## 2024-03-01 MED ORDER — OXYCODONE HCL 5 MG PO TABS
5.0000 mg | ORAL_TABLET | Freq: Once | ORAL | Status: AC | PRN
  Administered 2024-03-01: 5 mg via ORAL

## 2024-03-01 MED ORDER — ONDANSETRON HCL 4 MG/2ML IJ SOLN
INTRAMUSCULAR | Status: DC | PRN
Start: 2024-03-01 — End: 2024-03-01
  Administered 2024-03-01: 4 mg via INTRAVENOUS

## 2024-03-01 MED ORDER — MIDAZOLAM HCL 2 MG/2ML IJ SOLN
INTRAMUSCULAR | Status: DC | PRN
  Administered 2024-03-01: 2 mg via INTRAVENOUS

## 2024-03-01 MED ORDER — FENTANYL CITRATE PF 50 MCG/ML IJ SOSY
PREFILLED_SYRINGE | INTRAMUSCULAR | Status: AC
  Filled 2024-03-01: qty 1

## 2024-03-01 MED ORDER — CHLORHEXIDINE GLUCONATE 0.12 % MT SOLN
15.0000 mL | Freq: Once | OROMUCOSAL | Status: AC
  Administered 2024-03-01: 15 mL via OROMUCOSAL

## 2024-03-01 MED ORDER — WATER FOR IRRIGATION, STERILE IR SOLN
Status: DC | PRN
Start: 2024-03-01 — End: 2024-03-01
  Administered 2024-03-01: 500 mL

## 2024-03-01 MED ORDER — DEXAMETHASONE SODIUM PHOSPHATE 10 MG/ML IJ SOLN
INTRAMUSCULAR | Status: DC | PRN
Start: 2024-03-01 — End: 2024-03-01
  Administered 2024-03-01: 4 mg via INTRAVENOUS

## 2024-03-01 MED ORDER — OXYCODONE-ACETAMINOPHEN 5-325 MG PO TABS: 1.0000 | ORAL_TABLET | ORAL | 0 refills | Status: DC | PRN

## 2024-03-01 MED ORDER — CEFAZOLIN SODIUM-DEXTROSE 2-4 GM/100ML-% IV SOLN
2.0000 g | INTRAVENOUS | Status: AC
  Administered 2024-03-01: 2 g via INTRAVENOUS

## 2024-03-01 MED ORDER — ONDANSETRON HCL 4 MG PO TABS: 4.0000 mg | ORAL_TABLET | Freq: Every day | ORAL | 1 refills | Status: DC | PRN

## 2024-03-01 MED ORDER — FENTANYL CITRATE (PF) 100 MCG/2ML IJ SOLN
INTRAMUSCULAR | Status: DC | PRN
  Administered 2024-03-01: 100 ug via INTRAVENOUS

## 2024-03-01 MED ORDER — OXYCODONE HCL 5 MG PO TABS
ORAL_TABLET | ORAL | Status: AC
  Filled 2024-03-01: qty 1

## 2024-03-01 MED ORDER — MIDAZOLAM HCL 2 MG/2ML IJ SOLN
INTRAMUSCULAR | Status: AC
  Filled 2024-03-01: qty 2

## 2024-03-01 MED ORDER — CEFAZOLIN SODIUM-DEXTROSE 2-4 GM/100ML-% IV SOLN
INTRAVENOUS | Status: AC
Start: 2024-03-01 — End: 2024-03-01
  Filled 2024-03-01: qty 100

## 2024-03-01 MED ORDER — ONDANSETRON HCL 4 MG/2ML IJ SOLN
4.0000 mg | Freq: Once | INTRAMUSCULAR | Status: AC | PRN
  Administered 2024-03-01: 4 mg via INTRAVENOUS
  Filled 2024-03-01: qty 2

## 2024-03-01 MED ORDER — PROPOFOL 10 MG/ML IV BOLUS
INTRAVENOUS | Status: DC | PRN
  Administered 2024-03-01: 150 mg via INTRAVENOUS

## 2024-03-01 MED ORDER — LIDOCAINE 2% (20 MG/ML) 5 ML SYRINGE
INTRAMUSCULAR | Status: DC | PRN
Start: 2024-03-01 — End: 2024-03-01
  Administered 2024-03-01: 60 mg via INTRAVENOUS

## 2024-03-01 MED ORDER — FENTANYL CITRATE (PF) 100 MCG/2ML IJ SOLN
INTRAMUSCULAR | Status: AC
  Filled 2024-03-01: qty 2

## 2024-03-01 MED ORDER — SODIUM CHLORIDE 0.9 % IR SOLN
Status: DC | PRN
  Administered 2024-03-01: 3000 mL

## 2024-03-01 SURGICAL SUPPLY — 21 items
BAG DRAIN URO TABLE W/ADPT NS (BAG) ×1 IMPLANT
BAG HAMPER (MISCELLANEOUS) ×1 IMPLANT
EXTRACTOR STONE NITINOL NGAGE (UROLOGICAL SUPPLIES) ×1 IMPLANT
GLOVE BIO SURGEON STRL SZ8 (GLOVE) ×1 IMPLANT
GLOVE BIOGEL PI IND STRL 7.0 (GLOVE) ×2 IMPLANT
GOWN STRL REUS W/TWL LRG LVL3 (GOWN DISPOSABLE) ×1 IMPLANT
GOWN STRL REUS W/TWL XL LVL3 (GOWN DISPOSABLE) ×1 IMPLANT
GUIDEWIRE ANG ZIPWIRE 038X150 (WIRE) ×1 IMPLANT
GUIDEWIRE STR DUAL SENSOR (WIRE) ×1 IMPLANT
KIT TURNOVER CYSTO (KITS) ×1 IMPLANT
MANIFOLD NEPTUNE II (INSTRUMENTS) ×1 IMPLANT
PACK CYSTO (CUSTOM PROCEDURE TRAY) ×1 IMPLANT
PAD ARMBOARD POSITIONER FOAM (MISCELLANEOUS) ×1 IMPLANT
POSITIONER HEAD 8X9X4 ADT (SOFTGOODS) ×1 IMPLANT
SHEATH NAVIGATOR HD 11/13X36 (SHEATH) IMPLANT
SOL .9 NS 3000ML IRR UROMATIC (IV SOLUTION) ×2 IMPLANT
STENT URET 6FRX26 CONTOUR (STENTS) IMPLANT
SYR 10ML LL (SYRINGE) ×1 IMPLANT
TOWEL OR 17X26 4PK STRL BLUE (TOWEL DISPOSABLE) ×1 IMPLANT
TRACTIP FLEXIVA PULS ID 200XHI (Laser) IMPLANT
WATER STERILE IRR 500ML POUR (IV SOLUTION) ×1 IMPLANT

## 2024-03-01 NOTE — Interval H&P Note (Signed)
 History and Physical Interval Note:  03/01/2024 10:18 AM  Misty Cruz  has presented today for surgery, with the diagnosis of bilateral nephrolithiasis.  The various methods of treatment have been discussed with the patient and family. After consideration of risks, benefits and other options for treatment, the patient has consented to  Procedure(s): CYSTOSCOPY/URETEROSCOPY/HOLMIUM LASER/STENT PLACEMENT (Bilateral) HOLMIUM LASER APPLICATION (Bilateral) as a surgical intervention.  The patient's history has been reviewed, patient examined, no change in status, stable for surgery.  I have reviewed the patient's chart and labs.  Questions were answered to the patient's satisfaction.     [name]

## 2024-03-01 NOTE — Op Note (Signed)
 SABRAPreoperative diagnosis: bilateral renal calculi  Postoperative diagnosis: Same  Procedure: 1 cystoscopy 2. Bilateral retrograde pyelography 3.  Intraoperative fluoroscopy, under one hour, with interpretation 4.  right ureteroscopic stone manipulation with laser lithotripsy 5. Left ureteroscopic stone basket extraction 5.  right 6 x 26 JJ stent placement  Attending: Belvie Standing  Anesthesia: General  Estimated blood loss: None  Drains: right 6 x 26 JJ ureteral stent with tether  Specimens: stone for analysis  Antibiotics: ancef   Findings: bilateral lower pole renal calculi. No hydronephrosis. No masses/lesions in the bladder. Ureteral orifices in normal anatomic location.  Indications: Patient is a 63 year old female/female with a history of bilateral renal calculi and bilateral flank pain. After discussing treatment options, they decided proceed with bilateral ureteroscopic stone manipulation.  Procedure in detail: The patient was brought to the operating room and a brief timeout was done to ensure correct patient, correct procedure, correct site.  General anesthesia was administered patient was placed in dorsal lithotomy position.  Her genitalia was then prepped and draped in usual sterile fashion.  A rigid 22 French cystoscope was passed in the urethra and the bladder.  Bladder was inspected free masses or lesions.  the ureteral orifices were in the normal orthotopic locations. a 6 french ureteral catheter was then instilled into the left ureteral orifice.  a gentle retrograde was obtained and findings noted above. We then advanced a zipwire through the catheter and up to the renal pelvis.  we then removed the cystoscope and cannulated the left ureteral orifice with a semirigid ureteroscope.  We located no stone in the ureter. We then placed a sensor wire up to the renal pelvis. We advanced a flexible ureteroscope over the wire and up to the renal pelvis. We then used the flexible  ureteroscope to perform nephroscopy. We located calculi in the lower pole which were removed with an NGage basket. We then removed the scope and the zipwire and we elected not to place a stent since this was an uncomplicated ureteroscopy.  We then turned out attention to the right side. a 6 french ureteral catheter was then instilled into the right ureteral orifice.  a gentle retrograde was obtained and findings noted above. We then advanced a zipwire through the catheter and up to the renal pelvis. We then advanced a zipwire through the catheter and up to the renal pelvis.  we then removed the cystoscope and cannulated the right ureteral orifice with a semirigid ureteroscope.  We located no stone in the ureter. We then placed a sensor wire up to the renal pelvis. We removed the scope and advanced a 12/14 x 35cm access sheath up to the renal pelvis. We then used the flexible ureteroscope to perform nephroscopy. We located calculi in the lower pole which was fragmented with a 242nm laser fiber. The fragments were removed with an NGage basket. Once the stone were removed we then removed the access sheath under direct vision and noted to injury to the ureter. we then placed a 6 x 26 double-j ureteral stent over the original zip wire.  We then removed the wire and good coil was noted in the the renal pelvis under fluoroscopy and the bladder under direct vision.   the bladder was then drained and this concluded the procedure which was well tolerated by patient.  Complications: None  Condition: Stable, extubated, transferred to PACU  Plan: Patient is to be discharged home as to follow-up in 2 weeks. She is to remove her stent by pulling  the tether in 72 hours

## 2024-03-01 NOTE — Discharge Instructions (Signed)
 She is to remove her stent by pulling the tether in 72 hours

## 2024-03-01 NOTE — Anesthesia Procedure Notes (Signed)
 Procedure Name: LMA Insertion Date/Time: 03/01/2024 11:58 AM  Performed by: Cordella Elvie HERO, CRNAPre-anesthesia Checklist: Patient identified, Emergency Drugs available, Suction available, Patient being monitored and Timeout performed Patient Re-evaluated:Patient Re-evaluated prior to induction Oxygen Delivery Method: Circle system utilized Preoxygenation: Pre-oxygenation with 100% oxygen Induction Type: IV induction LMA: LMA inserted LMA Size: 4.0 Number of attempts: 1 Placement Confirmation: positive ETCO2, CO2 detector and breath sounds checked- equal and bilateral Tube secured with: Tape Dental Injury: Teeth and Oropharynx as per pre-operative assessment

## 2024-03-01 NOTE — Transfer of Care (Signed)
 Immediate Anesthesia Transfer of Care Note  Patient: Misty Cruz  Procedure(s) Performed: BILATERAL CYSTOSCOPY/URETEROSCOPY/HOLMIUM LASER,RIGHT STENT PLACEMENT (Bilateral: Ureter) HOLMIUM LASER APPLICATION (Bilateral: Ureter) CYSTOSCOPY, WITH CALCULUS REMOVAL USING BASKET (Bilateral: Ureter)  Patient Location: PACU  Anesthesia Type:General  Level of Consciousness: awake, alert , oriented, and patient cooperative  Airway & Oxygen Therapy: Patient Spontanous Breathing  Post-op Assessment: Report given to RN, Post -op Vital signs reviewed and stable, and Patient moving all extremities X 4  Post vital signs: Reviewed and stable  Last Vitals:  Vitals Value Taken Time  BP 155/79 1257  Temp 98.5 1257  Pulse 82 03/01/24 12:57  Resp 16 03/01/24 12:57  SpO2 100 % 03/01/24 12:57  Vitals shown include unfiled device data.  Last Pain:  Vitals:   03/01/24 0924  TempSrc: Oral  PainSc: 0-No pain         Complications: No notable events documented.

## 2024-03-01 NOTE — Anesthesia Preprocedure Evaluation (Signed)
 Anesthesia Evaluation  Patient identified by MRN, date of birth, ID band Patient awake    Reviewed: Allergy & Precautions, H&P , NPO status , Patient's Chart, lab work & pertinent test results  Airway Mallampati: II  TM Distance: >3 FB Neck ROM: Full    Dental  (+) Dental Advisory Given   Pulmonary neg pulmonary ROS, former smoker   Pulmonary exam normal breath sounds clear to auscultation       Cardiovascular hypertension, Pt. on medications Normal cardiovascular exam Rhythm:Regular Rate:Normal     Neuro/Psych negative neurological ROS  negative psych ROS   GI/Hepatic negative GI ROS, Neg liver ROS,,,  Endo/Other  diabetes, Well Controlled, Type 2, Oral Hypoglycemic AgentsHypothyroidism Hyperthyroidism   Renal/GU Renal disease  negative genitourinary   Musculoskeletal negative musculoskeletal ROS (+)    Abdominal   Peds negative pediatric ROS (+)  Hematology negative hematology ROS (+)   Anesthesia Other Findings   Reproductive/Obstetrics negative OB ROS                              Anesthesia Physical Anesthesia Plan  ASA: 2  Anesthesia Plan: General and General LMA   Post-op Pain Management: Minimal or no pain anticipated   Induction: Intravenous  PONV Risk Score and Plan: 4 or greater and Ondansetron  and Midazolam   Airway Management Planned: LMA and Oral ETT  Additional Equipment:   Intra-op Plan:   Post-operative Plan:   Informed Consent: I have reviewed the patients History and Physical, chart, labs and discussed the procedure including the risks, benefits and alternatives for the proposed anesthesia with the patient or authorized representative who has indicated his/her understanding and acceptance.     Dental advisory given  Plan Discussed with: CRNA and Surgeon  Anesthesia Plan Comments:         Anesthesia Quick Evaluation

## 2024-03-02 ENCOUNTER — Encounter (HOSPITAL_COMMUNITY): Payer: Self-pay | Admitting: Urology

## 2024-03-02 NOTE — Anesthesia Postprocedure Evaluation (Signed)
 Anesthesia Post Note  Patient: Misty Cruz  Procedure(s) Performed: BILATERAL CYSTOSCOPY/URETEROSCOPY/HOLMIUM LASER,RIGHT STENT PLACEMENT (Bilateral: Ureter) HOLMIUM LASER APPLICATION (Bilateral: Ureter) CYSTOSCOPY, WITH CALCULUS REMOVAL USING BASKET (Bilateral: Ureter)  Patient location during evaluation: Phase II Anesthesia Type: General Level of consciousness: awake Pain management: pain level controlled Vital Signs Assessment: post-procedure vital signs reviewed and stable Respiratory status: spontaneous breathing and respiratory function stable Cardiovascular status: blood pressure returned to baseline and stable Postop Assessment: no headache and no apparent nausea or vomiting Anesthetic complications: no Comments: Late entry   No notable events documented.   Last Vitals:  Vitals:   03/01/24 1433 03/01/24 1438  BP: (!) 154/69 (!) 156/85  Pulse: 70 68  Resp: 14 14  Temp:  36.4 C  SpO2: 99% 100%    Last Pain:  Vitals:   03/01/24 1438  TempSrc: Oral  PainSc: 6                  Yvonna JINNY Bosworth

## 2024-03-03 ENCOUNTER — Ambulatory Visit (HOSPITAL_COMMUNITY)
Admission: RE | Admit: 2024-03-03 | Discharge: 2024-03-03 | Disposition: A | Discharge status: Home / Self Care | Source: Ambulatory Visit | Attending: Orthopaedic Surgery | Admitting: Orthopaedic Surgery

## 2024-03-03 DIAGNOSIS — M25512 Pain in left shoulder: Secondary | ICD-10-CM | POA: Insufficient documentation

## 2024-03-03 DIAGNOSIS — G8929 Other chronic pain: Secondary | ICD-10-CM | POA: Insufficient documentation

## 2024-03-05 ENCOUNTER — Ambulatory Visit: Admitting: Urology

## 2024-03-07 ENCOUNTER — Encounter: Payer: Self-pay | Admitting: Orthopaedic Surgery

## 2024-03-07 ENCOUNTER — Ambulatory Visit (INDEPENDENT_AMBULATORY_CARE_PROVIDER_SITE_OTHER): Admitting: Orthopaedic Surgery

## 2024-03-07 VITALS — BP 173/69

## 2024-03-07 DIAGNOSIS — M75122 Complete rotator cuff tear or rupture of left shoulder, not specified as traumatic: Secondary | ICD-10-CM | POA: Diagnosis not present

## 2024-03-07 DIAGNOSIS — M545 Low back pain, unspecified: Secondary | ICD-10-CM | POA: Diagnosis not present

## 2024-03-07 NOTE — Progress Notes (Signed)
 My shoulder hurts and my back and right leg hurt.  She had MRI of the left shoulder showing: IMPRESSION: 1. Complete full-thickness, full width tear of the supraspinatus tendon with 3.4 cm of retraction. Large full-thickness tear of the anterior half of the infraspinatus tendon. 2. Mild subscapularis tendinosis. 3. Moderate tendinosis of the intra-articular portion of the long head of the biceps tendon. 4. Mild-moderate osteoarthritis of the glenohumeral joint  I have explained the findings to her and her husband.  I will have her see Dr. Onesimo for this. She is agreeable.  I have independently reviewed the MRI.    She recently had surgery on the kidney for stones.  She is in the recovery period now.  She has persistent pain of the right lower back with paresthesias to the right foot. She is not getting any better.  I will get MRI of the lumbar spine.  Lumbar spine is tender, ROM decreased, SLR positive right at 25 degrees, muscle tone and strength normal, NV intact., limp right.  Encounter Diagnoses  Name Primary?   Lumbar pain Yes   Nontraumatic complete tear of left rotator cuff    To see Dr. Onesimo for the shoulder on the left.  To get MRI of the lumbar spine.  Return in two weeks.  Call if any problem.  Precautions discussed.  Electronically Signed Lemond Stable, MD 7/16/202510:56 AM

## 2024-03-10 ENCOUNTER — Ambulatory Visit (HOSPITAL_COMMUNITY)
Admission: RE | Admit: 2024-03-10 | Discharge: 2024-03-10 | Disposition: A | Discharge status: Home / Self Care | Source: Ambulatory Visit | Attending: Orthopaedic Surgery | Admitting: Orthopaedic Surgery

## 2024-03-10 DIAGNOSIS — M545 Low back pain, unspecified: Secondary | ICD-10-CM | POA: Diagnosis present

## 2024-03-12 LAB — STONE ANALYSIS
Calcium Oxalate Dihydrate: 20 %
Calcium Oxalate Monohydrate: 80 %
Weight Calculi: 104 mg

## 2024-03-12 NOTE — Addendum Note (Signed)
 Addended by: MARCINE HUSBAND T on: 03/12/2024 03:07 PM   Modules accepted: Orders

## 2024-03-14 ENCOUNTER — Ambulatory Visit: Admitting: Orthopaedic Surgery

## 2024-03-14 ENCOUNTER — Encounter: Payer: Self-pay | Admitting: Orthopaedic Surgery

## 2024-03-14 ENCOUNTER — Ambulatory Visit: Admitting: Urology

## 2024-03-14 ENCOUNTER — Ambulatory Visit (INDEPENDENT_AMBULATORY_CARE_PROVIDER_SITE_OTHER): Admitting: Orthopaedic Surgery

## 2024-03-14 VITALS — BP 169/83 | HR 56

## 2024-03-14 VITALS — BP 154/84 | HR 63

## 2024-03-14 DIAGNOSIS — N200 Calculus of kidney: Secondary | ICD-10-CM

## 2024-03-14 DIAGNOSIS — M545 Low back pain, unspecified: Secondary | ICD-10-CM

## 2024-03-14 LAB — URINALYSIS, ROUTINE W REFLEX MICROSCOPIC
Bilirubin, UA: NEGATIVE
Ketones, UA: NEGATIVE
Leukocytes,UA: NEGATIVE
Nitrite, UA: NEGATIVE
Protein,UA: NEGATIVE
RBC, UA: NEGATIVE
Specific Gravity, UA: 1.01 (ref 1.005–1.030)
Urobilinogen, Ur: 0.2 mg/dL (ref 0.2–1.0)
pH, UA: 6 (ref 5.0–7.5)

## 2024-03-14 MED ORDER — POTASSIUM CITRATE ER 15 MEQ (1620 MG) PO TBCR: 1.0000 | EXTENDED_RELEASE_TABLET | Freq: Two times a day (BID) | ORAL | 11 refills | Status: DC

## 2024-03-14 NOTE — Progress Notes (Signed)
 My back hurts.  She had MRI on the lumbar spine showing: IMPRESSION: 1. Scoliotic curvature convex to the right with the apex at L2-3. 2. 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 No apparent change since the study of 12/17/2023. 3. 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 4. 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 5. 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  I have informed her of the findings.  I will have her see neurosurgery.  She has had epidural in the past.  I have independently reviewed the MRI.    Lower back is diffusely tender, muscle tone and strength normal, lumbar scoliosis present, NV intact.  Encounter Diagnosis  Name Primary?   Lumbar pain Yes   She did not get appointment to see Dr. Onesimo of this office.  This will be corrected today.  Go to neurosurgery.  Call if any problem.  Precautions discussed.  Electronically Signed Lemond Stable, MD 7/23/20252:28 PM

## 2024-03-14 NOTE — Patient Instructions (Addendum)
 Needs an appointment with DR. Cairns for Left shoulder  Pt needs note not to go to [employer]

## 2024-03-14 NOTE — Progress Notes (Signed)
 03/14/2024 11:12 AM   Misty Cruz 16-Nov-1960 [id number]  Referring provider: Margarete Maeola DASEN, FNP [address] [place],  [place] [address]  Followup nephrolithiasis   HPI: [name] is a 63yo here for followup for nephrolithiasis. She has recurrent Stones on UrocitK. She pulled her stent POD#3. She denies nay flank pain. No significant LUTS  PMH: Past Medical History:  Diagnosis Date   Diabetes mellitus    History of kidney stones    Hypercholesteremia    Hypertension    Hyperthyroidism    Thyroid disease     Surgical History: Past Surgical History:  Procedure Laterality Date   ABDOMINAL HYSTERECTOMY     BREAST BIOPSY Right 07/14/2023   MM RT BREAST BX W LOC DEV 1ST LESION IMAGE BX SPEC STEREO GUIDE 07/14/2023 [facility]   COLONOSCOPY N/A 06/04/2013   Procedure: COLONOSCOPY;  Surgeon: Margo LITTIE Haddock, MD;  Location: [facility];  Service: Endoscopy;  Laterality: N/A;  9:30 AM   CYSTOSCOPY WITH RETROGRADE PYELOGRAM, URETEROSCOPY AND STENT PLACEMENT Bilateral 03/03/2023   Procedure: CYSTOSCOPY WITH RETROGRADE PYELOGRAM, URETEROSCOPY AND STENT PLACEMENT;  Surgeon: Sherrilee Belvie LITTIE, MD;  Location: [facility];  Service: Urology;  Laterality: Bilateral;   CYSTOSCOPY/RETROGRADE/URETEROSCOPY/STONE EXTRACTION WITH BASKET Bilateral 03/01/2024   Procedure: CYSTOSCOPY, WITH CALCULUS REMOVAL USING BASKET;  Surgeon: Sherrilee Belvie LITTIE, MD;  Location: [facility];  Service: Urology;  Laterality: Bilateral;   CYSTOSCOPY/URETEROSCOPY/HOLMIUM LASER/STENT PLACEMENT Bilateral 03/01/2024   Procedure: BILATERAL CYSTOSCOPY/URETEROSCOPY/HOLMIUM LASER,RIGHT STENT PLACEMENT;  Surgeon: Sherrilee Belvie LITTIE, MD;  Location: [facility];  Service: Urology;  Laterality: Bilateral;   HOLMIUM LASER APPLICATION Bilateral 03/03/2023   Procedure: HOLMIUM LASER APPLICATION;  Surgeon: Sherrilee Belvie LITTIE, MD;  Location: [facility];  Service: Urology;  Laterality: Bilateral;   HOLMIUM LASER APPLICATION Bilateral  03/01/2024   Procedure: HOLMIUM LASER APPLICATION;  Surgeon: Sherrilee Belvie LITTIE, MD;  Location: [facility];  Service: Urology;  Laterality: Bilateral;   STONE EXTRACTION WITH BASKET Bilateral 03/03/2023   Procedure: STONE EXTRACTION WITH BASKET;  Surgeon: Sherrilee Belvie LITTIE, MD;  Location: [facility];  Service: Urology;  Laterality: Bilateral;    Home Medications:  Allergies as of 03/14/2024   No Known Allergies      Medication List        Accurate as of March 14, 2024 11:12 AM. If you have any questions, ask your nurse or doctor.          atorvastatin 10 MG tablet Commonly known as: LIPITOR Take 10 mg by mouth in the morning.   BLACK ELDERBERRY PO Take 2,000 mg by mouth in the morning.   cetirizine 10 MG tablet Commonly known as: ZYRTEC Take 10 mg by mouth in the morning.   cyclobenzaprine  10 MG tablet Commonly known as: FLEXERIL  Take 1 tablet (10 mg total) by mouth at bedtime. One tablet every night at bedtime as needed for spasm. What changed:  when to take this reasons to take this additional instructions   diazepam  5 MG tablet Commonly known as: VALIUM  Take one tablet by mouth with light food one hour prior to procedure.   glipiZIDE 10 MG tablet Commonly known as: GLUCOTROL Take 10 mg by mouth in the morning.   levothyroxine 75 MCG tablet Commonly known as: SYNTHROID Take 75 mcg by mouth every morning.   lisinopril 20 MG tablet Commonly known as: ZESTRIL Take 20 mg by mouth in the morning.   metFORMIN 500 MG tablet Commonly known as: GLUCOPHAGE Take 500 mg by  mouth 2 (two) times daily.   methocarbamol  500 MG tablet Commonly known as: ROBAXIN  Take 1 tablet (500 mg total) by mouth every 8 (eight) hours as needed for muscle spasms.   Multivitamin Tabs Take 1 tablet by mouth in the morning.   ondansetron  4 MG tablet Commonly known as: Zofran  Take 1 tablet (4 mg total) by mouth daily as needed for nausea or vomiting.   oxyCODONE -acetaminophen  5-325 MG  tablet Commonly known as: Percocet Take 1 tablet by mouth every 4 (four) hours as needed for severe pain (pain score 7-10).   pioglitazone 15 MG tablet Commonly known as: ACTOS Take 15 mg by mouth in the morning.   Potassium Citrate  15 MEQ (1620 MG) Tbcr Commonly known as: Urocit-K  15 Take 1 tablet by mouth 2 (two) times daily.   VITAMIN D PO Take 1 tablet by mouth in the morning.        Allergies: No Known Allergies  Family History: Family History  Problem Relation Age of Onset   Breast cancer Maternal Aunt    Colon cancer Neg Hx     Social History:  reports that she has quit smoking. Her smoking use included cigarettes. She has a 1.5 pack-year smoking history. She does not have any smokeless tobacco history on file. She reports that she does not drink alcohol and does not use drugs.  ROS: All other review of systems were reviewed and are negative except what is noted above in HPI  Physical Exam: BP (!) 169/83   Pulse (!) 56   LMP 09/06/2010   Constitutional:  Alert and oriented, No acute distress. HEENT: [place] AT, moist mucus membranes.  Trachea midline, no masses. Cardiovascular: No clubbing, cyanosis, or edema. Respiratory: Normal respiratory effort, no increased work of breathing. GI: Abdomen is soft, nontender, nondistended, no abdominal masses GU: No CVA tenderness.  Lymph: No cervical or inguinal lymphadenopathy. Skin: No rashes, bruises or suspicious lesions. Neurologic: Grossly intact, no focal deficits, moving all 4 extremities. Psychiatric: Normal mood and affect.  Laboratory Data: Lab Results  Component Value Date   WBC 7.1 02/05/2011   HGB 12.5 02/05/2011   HCT 35.2 (L) 02/05/2011   MCV 92.6 02/05/2011   PLT 194 02/05/2011    Lab Results  Component Value Date   CREATININE 0.73 02/28/2024    No results found for: PSA  No results found for: TESTOSTERONE  Lab Results  Component Value Date   HGBA1C 5.3 02/28/2024    Urinalysis     Component Value Date/Time   COLORURINE YELLOW 09/03/2011 2215   APPEARANCEUR Clear 02/17/2024 1043   LABSPEC 1.020 09/03/2011 2215   PHURINE 6.0 09/03/2011 2215   GLUCOSEU Negative 02/17/2024 1043   HGBUR TRACE (A) 09/03/2011 2215   BILIRUBINUR Negative 02/17/2024 1043   KETONESUR NEGATIVE 09/03/2011 2215   PROTEINUR Negative 02/17/2024 1043   PROTEINUR NEGATIVE 09/03/2011 2215   UROBILINOGEN 0.2 09/03/2011 2215   NITRITE Negative 02/17/2024 1043   NITRITE NEGATIVE 09/03/2011 2215   LEUKOCYTESUR Negative 02/17/2024 1043    Lab Results  Component Value Date   LABMICR Comment 02/17/2024   WBCUA 6-10 (A) 03/09/2023   LABEPIT 0-10 03/09/2023   BACTERIA None seen 03/09/2023    Pertinent Imaging:  Results for orders placed during the hospital encounter of 08/25/08  DG Abd 1 View  Narrative Clinical Data: Left flank pain.  The patient self removed a left ureteral stent earlier today. Recent left lithotripsy.  ABDOMEN - 1 VIEW 08/25/2008:  Comparison: Unenhanced CT  abdomen pelvis 08/07/2008.  Findings: Tiny stone fragment projected over the expected location of the mid left kidney.  The calculi identified in the right kidney on the prior examination are no longer visualized.  No visible distal ureteral calculi on either side.  Small phleboliths low in the pelvis as noted on the prior CT.  Bilateral tubal ligation clips.  Large amount of stool throughout the colon from cecum to rectum.  No evidence of bowel obstruction or significant ileus.  IMPRESSION:  1.  Tiny stone fragment projected over the expected location of the left mid kidney.  No visible left ureteral calculi. 2.  No acute abdominal abnormality apart from probable constipation.  Provider: Luke Cocking  No results found for this or any previous visit.  No results found for this or any previous visit.  No results found for this or any previous visit.  Results for orders placed in visit on  04/27/23  Ultrasound renal complete  Narrative CLINICAL DATA:  Follow-up nephrolithiasis  EXAM: RENAL / URINARY TRACT ULTRASOUND COMPLETE  COMPARISON:  CT abdomen pelvis 12/01/2022  FINDINGS: Right Kidney:  Renal measurements: 11.9 x 5.2 x 5.0 cm = volume: 163.1 mL. Normal renal cortical thickness and echogenicity. No hydronephrosis. 6 mm stone.  Left Kidney:  Renal measurements: 6.4 x 3.4 x 3.7 cm = volume: 42.4 mL. 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 9 mm stone. Mild pelviectasis. No renal mass identified.  Bladder:  Appears normal for degree of bladder distention.  Other:  None.  IMPRESSION: 1. Bilateral nephrolithiasis. 2. Mild left pelviectasis. 3. Malrotated left kidney.   Electronically Signed By: Bard Moats M.D. On: 11/12/2023 15:52  No results found for this or any previous visit.  Results for orders placed in visit on 12/01/22  CT HEMATURIA WORKUP  Narrative CLINICAL DATA:  Gross hematuria, left flank pain  EXAM: CT ABDOMEN AND PELVIS WITHOUT AND WITH CONTRAST  TECHNIQUE: Multidetector CT imaging of the abdomen and pelvis was performed following the standard protocol before and following the bolus administration of intravenous contrast.  RADIATION DOSE REDUCTION: This exam was performed according to the departmental dose-optimization program which includes automated exposure control, adjustment of the mA and/or kV according to patient size and/or use of iterative reconstruction technique.  CONTRAST:  125 mL Isovue -300 iodinated contrast IV  COMPARISON:  09/04/2011  FINDINGS: Lower chest: No acute abnormality.  Coronary artery calcifications.  Hepatobiliary: No focal liver abnormality is seen. Status post cholecystectomy. No biliary dilatation.  Pancreas: Unremarkable. No pancreatic ductal dilatation or surrounding inflammatory changes.  Spleen: Normal in size without significant abnormality.  Adrenals/Urinary Tract: Discoid left  adrenal gland. Normal right adrenal gland. Small bilateral nonobstructive renal calculi. 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 Normal size and location of the right kidney. No ureteral calculi or hydronephrosis. Limited opacification of the right ureter. Within this limitation, no evidence of urinary tract filling defect on delayed phase imaging. Bladder is unremarkable.  Stomach/Bowel: Stomach is within normal limits. Appendix not clearly visualized. No evidence of bowel wall thickening, distention, or inflammatory changes. Moderate burden of stool throughout the colon.  Vascular/Lymphatic: Aortic atherosclerosis. Varices in the left upper quadrant (series 10, image 30). No enlarged abdominal or pelvic lymph nodes.  Reproductive: No mass or other significant abnormality. The uterus is present despite stated history of hysterectomy.  Other: No abdominal wall hernia or abnormality. No ascites. 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  Musculoskeletal: No acute or significant  osseous findings.  IMPRESSION: 1. Small bilateral nonobstructive renal calculi. No ureteral calculi or hydronephrosis. 2. 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 3. Normal size and location of the right kidney. 4. Limited opacification of the right ureter. Within this limitation, no evidence of urinary tract filling defect on delayed phase imaging. 5. 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  Aortic Atherosclerosis (ICD10-I70.0).   Electronically Signed By: Marolyn JONETTA Jaksch M.D. On: 12/07/2022 17:12  Results for orders placed during the hospital encounter of 02/01/24  CT RENAL STONE STUDY  Narrative CLINICAL DATA:  Status post nephrolithiasis treatment.  EXAM: CT ABDOMEN  AND PELVIS WITHOUT CONTRAST  TECHNIQUE: Multidetector CT imaging of the abdomen and pelvis was performed following the standard protocol without IV contrast.  RADIATION DOSE REDUCTION: This exam was performed according to the departmental dose-optimization program which includes automated exposure control, adjustment of the mA and/or kV according to patient size and/or use of iterative reconstruction technique.  COMPARISON:  12/07/2022.  FINDINGS: Lower chest: No acute abnormality. No pleural or pericardial effusions noted.  Hepatobiliary: No focal liver abnormality is seen. No gallstones, gallbladder wall thickening, or biliary dilatation.  Pancreas: Unremarkable. No pancreatic ductal dilatation or surrounding inflammatory changes.  Spleen: Normal in size without focal abnormality.  Adrenals/Urinary Tract: Left kidney appears dysplastic. There are numerous small mm sized left renal stones without hydronephrosis. Right kidney stones measuring 7 mm and 3 mm. No hydronephrosis. No adrenal lesions.  Stomach/Bowel: Stomach is within normal limits. Appendix not seen and no evidence of appendicitis. No evidence of bowel wall thickening, distention, or inflammatory changes.  Vascular/Lymphatic: Aortic atherosclerosis. No enlarged abdominal or pelvic lymph nodes.  Reproductive: Uterus and bilateral adnexa are unremarkable.  Other: No abdominal wall hernia or abnormality. No abdominopelvic ascites.  Musculoskeletal: Osteopenia. Thoracolumbosacral degenerative disc disease.  IMPRESSION: 1. Bilateral nephrolithiasis. No obstructive uropathy. 2. 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 3. Aortic atherosclerosis (ICD10-I70.0).   Electronically Signed By: Fonda Field M.D. On: 02/05/2024 17:11   Assessment & Plan:    1. Nephrolithiasis (Primary) Repeat 24 hour urine   - Urinalysis, Routine w reflex microscopic   No follow-ups on file.  [name], MD  [facility] [place]

## 2024-03-20 ENCOUNTER — Ambulatory Visit (INDEPENDENT_AMBULATORY_CARE_PROVIDER_SITE_OTHER): Admitting: Orthopedic Surgery

## 2024-03-20 ENCOUNTER — Encounter: Payer: Self-pay | Admitting: Orthopedic Surgery

## 2024-03-20 ENCOUNTER — Telehealth: Payer: Self-pay

## 2024-03-20 ENCOUNTER — Telehealth: Payer: Self-pay | Admitting: Orthopedic Surgery

## 2024-03-20 VITALS — Ht 59.0 in | Wt 152.0 lb

## 2024-03-20 DIAGNOSIS — M1711 Unilateral primary osteoarthritis, right knee: Secondary | ICD-10-CM | POA: Insufficient documentation

## 2024-03-20 DIAGNOSIS — R319 Hematuria, unspecified: Secondary | ICD-10-CM | POA: Insufficient documentation

## 2024-03-20 DIAGNOSIS — M75122 Complete rotator cuff tear or rupture of left shoulder, not specified as traumatic: Secondary | ICD-10-CM

## 2024-03-20 DIAGNOSIS — E039 Hypothyroidism, unspecified: Secondary | ICD-10-CM | POA: Insufficient documentation

## 2024-03-20 DIAGNOSIS — J302 Other seasonal allergic rhinitis: Secondary | ICD-10-CM | POA: Insufficient documentation

## 2024-03-20 DIAGNOSIS — E669 Obesity, unspecified: Secondary | ICD-10-CM | POA: Insufficient documentation

## 2024-03-20 NOTE — Telephone Encounter (Signed)
 Patient called and left voicemail on Triage line. States she is ready to proceed with surgery.   CB [phone]

## 2024-03-20 NOTE — Telephone Encounter (Signed)
 Patient called and said she decided to do the surgery. CB#[phone]

## 2024-03-20 NOTE — Patient Instructions (Signed)
 We discussed reverse shoulder arthroplasty for your left shoulder.  If you are interested, please contact the clinic to provide updates.  If you have any further questions, we can discuss this in clinic.  In order to proceed with surgery, we will need to obtain medical clearance.  Once we have clearance, we can schedule surgery  We will need to obtain a CT scan of the left shoulder prior to scheduling surgery.

## 2024-03-20 NOTE — Progress Notes (Signed)
 Orthopaedic Clinic Return  Assessment: Misty Cruz is a 63 y.o. female with the following: Large left rotator cuff tear  Plan: Mrs. Archuleta has a large, retracted and chronic appearing tear of the left rotator cuff.  There is associated muscular atrophy.  This limits her motion, causes a lot of pain.  Based on the description of her symptoms, as well as the appearance on MRI, I do not think this tear is repairable.  As such, if it is bothering her enough to consider surgery I would recommend a reverse shoulder arthroplasty.  All questions have been answered.  Pamphlets have been provided.  If interested in surgery, she will contact the clinic.  Follow-up: Return if symptoms worsen or fail to improve.   Subjective:  Chief Complaint  Patient presents with   Shoulder Pain    Left    History of Present Illness: Misty Cruz is a 63 y.o. female who returns to clinic for evaluation of left shoulder pain.  I previously seen her, at which point, she was complaining of a lot of pain in the left side of the neck, radiating into her shoulder.  She has since seen Dr. Brenna.  She continues to have a lot of pain directly in the left shoulder.  She has difficulty with overhead motion.  She had an injection, approximately 2 months ago.  This provided minimal relief of her symptoms.  She has obtained an MRI, and is here to review the findings.  Review of Systems: No fevers or chills No numbness or tingling No chest pain No shortness of breath No bowel or bladder dysfunction No GI distress No headaches   Objective: Ht 4' 11 (1.499 m)   Wt 152 lb (68.9 kg)   LMP 09/06/2010   BMI 30.70 kg/m   Physical Exam:  Alert and oriented.  No acute distress.  Left shoulder without deformity.  No redness.  Forward flexion is limited to 120 degrees.  100 degrees of abduction.  Internal rotation at side of her hip.  Sensation is intact in the axillary nerve distribution.  IMAGING: I  personally reviewed the following images:   Left shoulder MRI  IMPRESSION: 1. Complete full-thickness, full width tear of the supraspinatus tendon with 3.4 cm of retraction. Large full-thickness tear of the anterior half of the infraspinatus tendon. 2. Mild subscapularis tendinosis. 3. Moderate tendinosis of the intra-articular portion of the long head of the biceps tendon. 4. Mild-moderate osteoarthritis of the glenohumeral joint.    Oneil DELENA Horde, MD 03/20/2024 10:07 AM

## 2024-03-21 ENCOUNTER — Ambulatory Visit: Admitting: Orthopaedic Surgery

## 2024-03-21 NOTE — Telephone Encounter (Signed)
 disregard

## 2024-03-21 NOTE — Telephone Encounter (Signed)
 I ordered CT scan told patient to call and schedule Also sent clearance letter to her PCP  Told her need to see her after the CT scan

## 2024-03-21 NOTE — Addendum Note (Signed)
 Addended byBETHA JENEAN GREIG LELON on: 03/21/2024 08:25 AM   Modules accepted: Orders

## 2024-03-23 ENCOUNTER — Other Ambulatory Visit: Payer: Self-pay | Admitting: Urology

## 2024-03-26 ENCOUNTER — Ambulatory Visit (HOSPITAL_COMMUNITY)
Admission: RE | Admit: 2024-03-26 | Discharge: 2024-03-26 | Disposition: A | Discharge status: Home / Self Care | Source: Ambulatory Visit | Attending: Orthopedic Surgery | Admitting: Orthopedic Surgery

## 2024-03-26 DIAGNOSIS — M75122 Complete rotator cuff tear or rupture of left shoulder, not specified as traumatic: Secondary | ICD-10-CM | POA: Diagnosis present

## 2024-03-27 ENCOUNTER — Other Ambulatory Visit (HOSPITAL_COMMUNITY)

## 2024-03-29 ENCOUNTER — Other Ambulatory Visit (HOSPITAL_COMMUNITY): Payer: Self-pay

## 2024-03-29 ENCOUNTER — Telehealth: Payer: Self-pay | Admitting: Urology

## 2024-03-29 DIAGNOSIS — M4126 Other idiopathic scoliosis, lumbar region: Secondary | ICD-10-CM

## 2024-03-29 DIAGNOSIS — M8588 Other specified disorders of bone density and structure, other site: Secondary | ICD-10-CM

## 2024-03-29 LAB — LITHOLINK 24HR URINE PANEL
Ammonium, Urine: 10 mmol/(24.h) — ABNORMAL LOW (ref 15–60)
Calcium Oxalate Saturation: 2.74 — ABNORMAL LOW (ref 6.00–10.00)
Calcium Phosphate Saturation: 0.5 (ref 0.50–2.00)
Calcium, Urine: 142 mg/(24.h) (ref <200)
Calcium/Creatinine Ratio: 133 mg/g{creat} (ref 51–262)
Calcium/Kg Body Weight: 2 mg/kg/d (ref <4.0)
Chloride, Urine: 155 mmol/(24.h) (ref 70–250)
Citrate, Urine: 977 mg/(24.h) (ref >550)
Creatinine, Urine: 1070 mg/(24.h)
Creatinine/Kg Body Weight: 15.2 mg/kg/d (ref 8.7–20.3)
Magnesium, Urine: 61 mg/(24.h) (ref 30–120)
Oxalate, Urine: 29 mg/(24.h) (ref 20–40)
Phosphorus, Urine: 719 mg/(24.h) (ref 600–1200)
Potassium, Urine: 94 mmol/(24.h) (ref 20–100)
Protein Catabolic Rate: 1 g/kg/d (ref 0.8–1.4)
Sodium, Urine: 149 mmol/(24.h) (ref 50–150)
Sulfate, Urine: 23 meq/(24.h) (ref 20–80)
Urea Nitrogen, Urine: 8.95 g/(24.h) (ref 6.00–14.00)
Uric Acid Saturation: 0.19 (ref <1.00)
Uric Acid, Urine: 547 mg/(24.h) (ref <750)
Urine Volume (Preserved): 2960 mL/(24.h) (ref 500–4000)
pH, 24 hr, Urine: 6.357 — ABNORMAL HIGH (ref 5.800–6.200)

## 2024-03-29 NOTE — Telephone Encounter (Signed)
 Patient was made aware results will be sent to NP to review. Patient voiced understanding.

## 2024-03-29 NOTE — Telephone Encounter (Signed)
 Saw 24 hour urine results on Mychart and wants to talk about what she needs to do

## 2024-03-30 ENCOUNTER — Ambulatory Visit: Admitting: Orthopedic Surgery

## 2024-04-02 ENCOUNTER — Ambulatory Visit (HOSPITAL_COMMUNITY)
Admission: RE | Admit: 2024-04-02 | Discharge: 2024-04-02 | Disposition: A | Discharge status: Home / Self Care | Source: Ambulatory Visit

## 2024-04-02 DIAGNOSIS — M4126 Other idiopathic scoliosis, lumbar region: Secondary | ICD-10-CM | POA: Diagnosis present

## 2024-04-02 DIAGNOSIS — M8588 Other specified disorders of bone density and structure, other site: Secondary | ICD-10-CM | POA: Diagnosis present

## 2024-04-03 ENCOUNTER — Telehealth: Payer: Self-pay | Admitting: Orthopedic Surgery

## 2024-04-03 ENCOUNTER — Encounter: Payer: Self-pay | Admitting: Orthopedic Surgery

## 2024-04-03 NOTE — Telephone Encounter (Signed)
 Dr. Onesimo pt - pt's daughter lvm, she is wanting someone to call the pt back at [phone].  The pt will be seeing Dr. Brenna on 04/11/24 since Dr. JAYSON is OOO.  She is wanting to know after her goes over the CT w/her, what next.  If she needs to have surgery can we move forward w/scheduling or will she have to wait and see Dr. JAYSON before it can be scheduled.

## 2024-04-04 NOTE — Telephone Encounter (Signed)
 Spoke w/ pt and let her know letter was sent to her PCP 03/21/24, and that once we receive clearance we can get her scheduled.

## 2024-04-05 ENCOUNTER — Encounter (HOSPITAL_COMMUNITY): Payer: Self-pay

## 2024-04-05 ENCOUNTER — Other Ambulatory Visit: Payer: Self-pay

## 2024-04-05 ENCOUNTER — Ambulatory Visit (HOSPITAL_COMMUNITY)

## 2024-04-05 DIAGNOSIS — Z7409 Other reduced mobility: Secondary | ICD-10-CM | POA: Diagnosis present

## 2024-04-05 DIAGNOSIS — M5459 Other low back pain: Secondary | ICD-10-CM | POA: Diagnosis present

## 2024-04-05 DIAGNOSIS — M545 Low back pain, unspecified: Secondary | ICD-10-CM | POA: Insufficient documentation

## 2024-04-05 DIAGNOSIS — M79604 Pain in right leg: Secondary | ICD-10-CM | POA: Diagnosis present

## 2024-04-05 NOTE — Telephone Encounter (Signed)
Answered by phone call

## 2024-04-05 NOTE — Therapy (Addendum)
 OUTPATIENT PHYSICAL THERAPY THORACOLUMBAR EVALUATION   Patient Name: Misty Cruz MRN: [id number] DOB:[birth year], 63 y.o., female Today's Date: 04/05/2024  END OF SESSION:  PT End of Session - 04/05/24 0932     Visit Number 1    Number of Visits 16    Date for PT Re-Evaluation 05/03/24    Authorization Type [employer] HMO/PPO    Authorization Time Period No auth    PT Start Time 0933    PT Stop Time 1018    PT Time Calculation (min) 45 min    Activity Tolerance Patient tolerated treatment well    Behavior During Therapy WFL for tasks assessed/performed          Past Medical History:  Diagnosis Date   Diabetes mellitus    History of kidney stones    Hypercholesteremia    Hypertension    Hyperthyroidism    Thyroid disease    Past Surgical History:  Procedure Laterality Date   ABDOMINAL HYSTERECTOMY     BREAST BIOPSY Right 07/14/2023   MM RT BREAST BX W LOC DEV 1ST LESION IMAGE BX SPEC STEREO GUIDE 07/14/2023 [facility]   COLONOSCOPY N/A 06/04/2013   Procedure: COLONOSCOPY;  Surgeon: Margo LITTIE Haddock, MD;  Location: [facility];  Service: Endoscopy;  Laterality: N/A;  9:30 AM   CYSTOSCOPY WITH RETROGRADE PYELOGRAM, URETEROSCOPY AND STENT PLACEMENT Bilateral 03/03/2023   Procedure: CYSTOSCOPY WITH RETROGRADE PYELOGRAM, URETEROSCOPY AND STENT PLACEMENT;  Surgeon: Sherrilee Belvie LITTIE, MD;  Location: [facility];  Service: Urology;  Laterality: Bilateral;   CYSTOSCOPY/RETROGRADE/URETEROSCOPY/STONE EXTRACTION WITH BASKET Bilateral 03/01/2024   Procedure: CYSTOSCOPY, WITH CALCULUS REMOVAL USING BASKET;  Surgeon: Sherrilee Belvie LITTIE, MD;  Location: [facility];  Service: Urology;  Laterality: Bilateral;   CYSTOSCOPY/URETEROSCOPY/HOLMIUM LASER/STENT PLACEMENT Bilateral 03/01/2024   Procedure: BILATERAL CYSTOSCOPY/URETEROSCOPY/HOLMIUM LASER,RIGHT STENT PLACEMENT;  Surgeon: Sherrilee Belvie LITTIE, MD;  Location: [facility];  Service: Urology;  Laterality: Bilateral;   HOLMIUM LASER  APPLICATION Bilateral 03/03/2023   Procedure: HOLMIUM LASER APPLICATION;  Surgeon: Sherrilee Belvie LITTIE, MD;  Location: [facility];  Service: Urology;  Laterality: Bilateral;   HOLMIUM LASER APPLICATION Bilateral 03/01/2024   Procedure: HOLMIUM LASER APPLICATION;  Surgeon: Sherrilee Belvie LITTIE, MD;  Location: [facility];  Service: Urology;  Laterality: Bilateral;   STONE EXTRACTION WITH BASKET Bilateral 03/03/2023   Procedure: STONE EXTRACTION WITH BASKET;  Surgeon: Sherrilee Belvie LITTIE, MD;  Location: [facility];  Service: Urology;  Laterality: Bilateral;   Patient Active Problem List   Diagnosis Date Noted   Arthritis of right knee 03/20/2024   Hematuria 03/20/2024   Hypothyroidism 03/20/2024   Obesity 03/20/2024   Seasonal allergies 03/20/2024   Hypertensive disorder 11/30/2023   Former smoker 11/30/2023   Hyperlipemia 11/30/2023   Hyperlipidemia associated with type 2 diabetes mellitus (HCC) 11/30/2023   Vitamin D deficiency 11/30/2023   Nephrolithiasis 03/03/2023   Diabetes mellitus (HCC) 12/10/2015    PCP: Margarete Maeola DASEN, FNP  REFERRING PROVIDER: Darnella Dorn SAUNDERS, MD  REFERRING DIAG: M41.26 (ICD-10-CM) - Other idiopathic scoliosis, lumbar region  Rationale for Evaluation and Treatment: Rehabilitation  THERAPY DIAG:  Other low back pain  Low back pain radiating to right leg  Impaired functional mobility, balance, gait, and endurance  ONSET DATE: Long time Chronic  SUBJECTIVE:  SUBJECTIVE STATEMENT: Patient reports her back pain has been going on for years. Reports she has numbness/tingling into RLE, down to toes. Come and goes Insidiously. Reports she also has pain into RUE that they said is coming from her back as well, feels knots in R arm.   PERTINENT HISTORY:  Had kidney stones L shoulder  rotator cuff tear (current, sees MD soon regarding plan)   PAIN:  Are you having pain? Yes: NPRS scale: 7/10 currently, (can get up to a 9) Pain location: Mid-low back  Pain description: Dull ache Aggravating factors: Bending down Relieving factors: Rest, pain meds   PRECAUTIONS: None  RED FLAGS: None   WEIGHT BEARING RESTRICTIONS: No  FALLS:  Has patient fallen in last 6 months? No  LIVING ENVIRONMENT: Stairs: Yes: External: 1 steps; can reach both Has following equipment at home: None  OCCUPATION: N/A  PLOF: Independent  PATIENT GOALS: To try to get better  NEXT MD VISIT: Not sure  OBJECTIVE:  Note: Objective measures were completed at Evaluation unless otherwise noted.  DIAGNOSTIC FINDINGS:  IMPRESSION: 1. Scoliotic curvature convex to the right with the apex at L2-3. 2. 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 No apparent change since the study of 12/17/2023. 3. 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 4. 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 5. 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  PATIENT SURVEYS:  Modified Oswestry: Modified Oswestry Low Back Pain Disability Questionnaire: 23 / 50 = 46.0 %  Interpretation of scores: Score Category Description  0-20% Minimal Disability The patient can cope with most living activities. Usually no treatment is indicated apart from advice on lifting, sitting and exercise  21-40% Moderate Disability The patient experiences more pain and difficulty with sitting, lifting and standing. Travel and social life are more difficult and they may be disabled from work. Personal care, sexual activity and sleeping are not grossly affected, and the patient can usually be managed by conservative means  41-60% Severe Disability Pain remains the main problem in this group, but activities  of daily living are affected. These patients require a detailed investigation  61-80% Crippled Back pain impinges on all aspects of the patient's life. Positive intervention is required  81-100% Bed-bound  These patients are either bed-bound or exaggerating their symptoms  Bluford FORBES Zoe DELENA Karon DELENA, et al. Surgery versus conservative management of stable thoracolumbar fracture: the PRESTO feasibility RCT. [place] ([place]): [employer]; 2021 Nov. [facility] Technology Assessment, No. 25.62.) Appendix 3, Oswestry Disability Index category descriptors. Available from: FindJewel[employer].cz  Minimally Clinically Important Difference (MCID) = 12.8%  COGNITION: Overall cognitive status: Within functional limits for tasks assessed     SENSATION: Light touch: Impaired  on RLE   POSTURE: rounded shoulders, forward head, and increased thoracic kyphosis  PALPATION: TTP Throughout thoracic and lumbar spine w/ CPA grade 1-2 Very stiff Tight musculature throughout Inc discomfort with palpation to sacrum   LUMBAR ROM:   AROM eval  Flexion Mid shin, * LBP  Extension 25% avail, *LBP and RLE  Right lateral flexion   Left lateral flexion   Right rotation 25% avail, *Low back, RLE, and shoulder  Left rotation 25% avail, *Low back, RLE, and shoulder   (Blank rows = not tested)    *=painful  LOWER EXTREMITY ROM:     Active  Right eval Left eval  Hip flexion  Hip extension    Hip abduction    Hip adduction    Hip internal rotation    Hip external rotation    Knee flexion    Knee extension    Ankle dorsiflexion    Ankle plantarflexion    Ankle inversion    Ankle eversion     (Blank rows = not tested)  LOWER EXTREMITY MMT:    MMT Right eval Left eval  Hip flexion 3+ 4-  Hip extension 3+ 4-  Hip abduction 4- 4-  Hip adduction    Hip internal rotation    Hip external rotation    Knee flexion    Knee extension    Ankle dorsiflexion 5 5   Ankle plantarflexion    Ankle inversion    Ankle eversion     (Blank rows = not tested)  LUMBAR SPECIAL TESTS:  Straight leg raise test: Positive  FUNCTIONAL TESTS:  30 seconds chair stand test 2 minute walk test: Next session  30 second chair stand: 7 STS, RLE pain (7/10)  GAIT: Distance walked: 100 Assistive device utilized: None Level of assistance: Complete Independence Comments: Dec hip ext/flex bilaterally and dec trunk rotation, causing somewhat of a waddle (hip hike bilaterally)  TREATMENT DATE:  04/05/24: PT Eval and HEP                                                                                                                                 PATIENT EDUCATION:  Education details: PT evaluation, objective findings, POC, Importance of HEP, Precautions, Clinic policies  Person educated: Patient Education method: Explanation and Demonstration Education comprehension: verbalized understanding and returned demonstration  HOME EXERCISE PROGRAM: Access Code: [email or web address] URL: [email or web address] Date: 04/05/2024 Prepared by: Rosaria Powell-Butler  Exercises - Supine Lower Trunk Rotation  - 2 x daily - 7 x weekly - 2 sets - 10 reps - Supine Single Knee to Double Knee to Chest Stretch  - 2 x daily - 7 x weekly - 2 sets - 10 reps, w/ towel - Supine Transversus Abdominis Bracing - Hands on Stomach  - 2 x daily - 7 x weekly - 2 sets - 10 reps - 5 hold  ASSESSMENT:  CLINICAL IMPRESSION: Patient is a 63 y.o. female who was seen today for physical therapy evaluation and treatment for M41.26 (ICD-10-CM) - Other idiopathic scoliosis, lumbar region. On this date, patient demonstrates decreased lumbar ROM, decreased LE strength (RLE>LLE), difficulty with transfers (STS), altered gait, poor posture, and increased pain, all of which could be contributing to patient's impaired function. Encouraged use of heat to low back to loosen tissues. Patient will benefit from  continued skilled physical therapy in order to address the above to improve overall function and quality of life.    OBJECTIVE IMPAIRMENTS: Abnormal gait, decreased activity tolerance, decreased balance, decreased endurance, decreased mobility, difficulty walking, decreased ROM, decreased strength, hypomobility, improper body mechanics, postural dysfunction, and pain.   ACTIVITY  LIMITATIONS: carrying, lifting, bending, sitting, standing, squatting, stairs, and transfers  PARTICIPATION LIMITATIONS: meal prep, cleaning, laundry, community activity, and yard work  PERSONAL FACTORS: N/A are also affecting patient's functional outcome.   REHAB POTENTIAL: Good  CLINICAL DECISION MAKING: Stable/uncomplicated  EVALUATION COMPLEXITY: Low   GOALS: Goals reviewed with patient? No  SHORT TERM GOALS: Target date: 04/26/24 Patient will be independent with performance of HEP to demonstrate adequate self management of symptoms.  Baseline:  Goal status: INITIAL  2.   Patient will report at least a 25% improvement with function and/or pain reduction overall since beginning PT. Baseline:  Goal status: INITIAL  LONG TERM GOALS: Target date: 05/31/24 Patient will improve Modified Oswestry score by 12.8 % in order to demonstrate improved self-perceived disability and overall function while meeting MCID.  Baseline: Goal status: INITIAL   2.  Patient will improve  30 second chair stand test by at least 2 STS  in order to demonstrate improved LE strength and endurance required for community ambulation. Baseline:  Goal status: INITIAL   3.  Patient will report pain rating of 5/10 or less following completion of every day ADL/iADLs.  Baseline:  Goal status: INITIAL   4.  Patient will report overall 50% improvement since beginning PT. Baseline:  Goal status: INITIAL   PLAN:  PT FREQUENCY: 2x/week  PT DURATION: 8 weeks  PLANNED INTERVENTIONS: 97164- PT Re-evaluation, 97110-Therapeutic  exercises, 97530- Therapeutic activity, [id number]- Neuromuscular re-education, 97535- Self Care, [id number]- Manual therapy, [id number]- Gait training, [phone]- Electrical stimulation (manual), [id number]- Traction (mechanical), [phone] (1-2 muscles), [id number] (3+ muscles)- Dry Needling, Patient/Family education, Balance training, Stair training, Taping, Joint mobilization, Spinal mobilization, Cryotherapy, and Moist heat.  PLAN FOR NEXT SESSION: Review of HEP and goals, test, progress lumbar ROM, core strengthening, LE strengthening, STM to paraspinals, grade 2/3 CPA/UPA, try heat during supine activities   12:47 PM, 04/05/24 Skylinn Vialpando Powell-Butler, PT, DPT [facility] [place]

## 2024-04-06 ENCOUNTER — Ambulatory Visit: Admitting: Orthopedic Surgery

## 2024-04-09 NOTE — Telephone Encounter (Signed)
 Patient called and made aware that a message will be sent to provider who will be in office tomorrow.  Patient voiced understanding.

## 2024-04-09 NOTE — Telephone Encounter (Signed)
 Called back still needing results

## 2024-04-10 ENCOUNTER — Telehealth: Payer: Self-pay | Admitting: Orthopedic Surgery

## 2024-04-10 MED ORDER — INDAPAMIDE 2.5 MG PO TABS: 2.5000 mg | ORAL_TABLET | Freq: Every day | ORAL | 11 refills | Status: DC

## 2024-04-10 NOTE — Telephone Encounter (Signed)
 Dr. Onesimo pt - spoke w/the pt, she wants to know if we have received clearance from her doctor yet.  [phone]

## 2024-04-10 NOTE — Addendum Note (Signed)
 Addended byBETHA MALACHY SLICE on: 04/10/2024 06:19 PM   Modules accepted: Orders

## 2024-04-10 NOTE — Telephone Encounter (Signed)
 Patient called and made aware.

## 2024-04-10 NOTE — Telephone Encounter (Signed)
 Spoke w/Damon at Dr. Maeola Baxter office this morning, he requested the letter be re-faxed, faxed it to [phone].

## 2024-04-11 ENCOUNTER — Ambulatory Visit: Admitting: Orthopaedic Surgery

## 2024-04-11 ENCOUNTER — Ambulatory Visit (HOSPITAL_COMMUNITY): Admitting: Physical Therapy

## 2024-04-11 DIAGNOSIS — M5459 Other low back pain: Secondary | ICD-10-CM | POA: Diagnosis not present

## 2024-04-11 DIAGNOSIS — Z7409 Other reduced mobility: Secondary | ICD-10-CM

## 2024-04-11 DIAGNOSIS — M545 Low back pain, unspecified: Secondary | ICD-10-CM

## 2024-04-11 NOTE — Telephone Encounter (Signed)
 Patient wants to know if she is to continue the potassium rx along with the new prescription, indapamide  2.5mg  daily?

## 2024-04-11 NOTE — Therapy (Signed)
 OUTPATIENT PHYSICAL THERAPY THORACOLUMBAR TREATMENT   Patient Name: Misty Cruz MRN: [id number] DOB:19-Jun-1961, 63 y.o., female Today's Date: 04/11/2024  END OF SESSION:  PT End of Session - 04/11/24 1138     Visit Number 2    Number of Visits 16    Date for PT Re-Evaluation 05/03/24    Authorization Type [employer] HMO/PPO    Authorization Time Period No auth    PT Start Time 1142    PT Stop Time 1225    PT Time Calculation (min) 43 min    Activity Tolerance Patient tolerated treatment well    Behavior During Therapy WFL for tasks assessed/performed          Past Medical History:  Diagnosis Date   Diabetes mellitus    History of kidney stones    Hypercholesteremia    Hypertension    Hyperthyroidism    Thyroid disease    Past Surgical History:  Procedure Laterality Date   ABDOMINAL HYSTERECTOMY     BREAST BIOPSY Right 07/14/2023   MM RT BREAST BX W LOC DEV 1ST LESION IMAGE BX SPEC STEREO GUIDE 07/14/2023 [facility]   COLONOSCOPY N/A 06/04/2013   Procedure: COLONOSCOPY;  Surgeon: Margo LITTIE Haddock, MD;  Location: [facility];  Service: Endoscopy;  Laterality: N/A;  9:30 AM   CYSTOSCOPY WITH RETROGRADE PYELOGRAM, URETEROSCOPY AND STENT PLACEMENT Bilateral 03/03/2023   Procedure: CYSTOSCOPY WITH RETROGRADE PYELOGRAM, URETEROSCOPY AND STENT PLACEMENT;  Surgeon: Sherrilee Belvie LITTIE, MD;  Location: [facility];  Service: Urology;  Laterality: Bilateral;   CYSTOSCOPY/RETROGRADE/URETEROSCOPY/STONE EXTRACTION WITH BASKET Bilateral 03/01/2024   Procedure: CYSTOSCOPY, WITH CALCULUS REMOVAL USING BASKET;  Surgeon: Sherrilee Belvie LITTIE, MD;  Location: [facility];  Service: Urology;  Laterality: Bilateral;   CYSTOSCOPY/URETEROSCOPY/HOLMIUM LASER/STENT PLACEMENT Bilateral 03/01/2024   Procedure: BILATERAL CYSTOSCOPY/URETEROSCOPY/HOLMIUM LASER,RIGHT STENT PLACEMENT;  Surgeon: Sherrilee Belvie LITTIE, MD;  Location: [facility];  Service: Urology;  Laterality: Bilateral;   HOLMIUM LASER  APPLICATION Bilateral 03/03/2023   Procedure: HOLMIUM LASER APPLICATION;  Surgeon: Sherrilee Belvie LITTIE, MD;  Location: [facility];  Service: Urology;  Laterality: Bilateral;   HOLMIUM LASER APPLICATION Bilateral 03/01/2024   Procedure: HOLMIUM LASER APPLICATION;  Surgeon: Sherrilee Belvie LITTIE, MD;  Location: [facility];  Service: Urology;  Laterality: Bilateral;   STONE EXTRACTION WITH BASKET Bilateral 03/03/2023   Procedure: STONE EXTRACTION WITH BASKET;  Surgeon: Sherrilee Belvie LITTIE, MD;  Location: [facility];  Service: Urology;  Laterality: Bilateral;   Patient Active Problem List   Diagnosis Date Noted   Arthritis of right knee 03/20/2024   Hematuria 03/20/2024   Hypothyroidism 03/20/2024   Obesity 03/20/2024   Seasonal allergies 03/20/2024   Hypertensive disorder 11/30/2023   Former smoker 11/30/2023   Hyperlipemia 11/30/2023   Hyperlipidemia associated with type 2 diabetes mellitus (HCC) 11/30/2023   Vitamin D deficiency 11/30/2023   Nephrolithiasis 03/03/2023   Diabetes mellitus (HCC) 12/10/2015    PCP: Margarete Maeola DASEN, FNP  REFERRING PROVIDER: Darnella Dorn SAUNDERS, MD  REFERRING DIAG: M41.26 (ICD-10-CM) - Other idiopathic scoliosis, lumbar region  Rationale for Evaluation and Treatment: Rehabilitation  THERAPY DIAG:  Other low back pain  Low back pain radiating to right leg  Impaired functional mobility, balance, gait, and endurance  ONSET DATE: Long time Chronic  SUBJECTIVE:  SUBJECTIVE STATEMENT: Pt comes to visit today with her younger sister.  Reports pain of 7/10 pain in lower back and into Rt LE.  Pt also reports pain in Lt shoulder stating she is to have surgery soon for torn rotator cuff.  Reports she's been trying to do the exercises at home but her shoulder and back bother her.     Evaluation:  Patient reports her back pain has been going on for years. Reports she has numbness/tingling into RLE, down to toes. Come and goes Insidiously. Reports she also has pain into RUE that they said is coming from her back as well, feels knots in R arm.   PERTINENT HISTORY:  Had kidney stones L shoulder rotator cuff tear (current, sees MD soon regarding plan)   PAIN:  Are you having pain? Yes: NPRS scale: 7/10 currently, (can get up to a 9) Pain location: Mid-low back  Pain description: Dull ache Aggravating factors: Bending down Relieving factors: Rest, pain meds   PRECAUTIONS: None  RED FLAGS: None   WEIGHT BEARING RESTRICTIONS: No  FALLS:  Has patient fallen in last 6 months? No  LIVING ENVIRONMENT: Stairs: Yes: External: 1 steps; can reach both Has following equipment at home: None  OCCUPATION: N/A  PLOF: Independent  PATIENT GOALS: To try to get better  NEXT MD VISIT: Not sure  OBJECTIVE:  Note: Objective measures were completed at Evaluation unless otherwise noted.  DIAGNOSTIC FINDINGS:  IMPRESSION: 1. Scoliotic curvature convex to the right with the apex at L2-3. 2. 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 No apparent change since the study of 12/17/2023. 3. 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 4. 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 5. 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  PATIENT SURVEYS:  Modified Oswestry: Modified Oswestry Low Back Pain Disability Questionnaire: 23 / 50 = 46.0 %  Interpretation of scores: Score Category Description  0-20% Minimal Disability The patient can cope with most living activities. Usually no treatment is indicated apart from advice on lifting, sitting and exercise  21-40% Moderate Disability The patient experiences more pain and difficulty with  sitting, lifting and standing. Travel and social life are more difficult and they may be disabled from work. Personal care, sexual activity and sleeping are not grossly affected, and the patient can usually be managed by conservative means  41-60% Severe Disability Pain remains the main problem in this group, but activities of daily living are affected. These patients require a detailed investigation  61-80% Crippled Back pain impinges on all aspects of the patient's life. Positive intervention is required  81-100% Bed-bound  These patients are either bed-bound or exaggerating their symptoms  Bluford FORBES Zoe DELENA Karon DELENA, et al. Surgery versus conservative management of stable thoracolumbar fracture: the PRESTO feasibility RCT. [place] ([place]): [employer]; 2021 Nov. [facility] Technology Assessment, No. 25.62.) Appendix 3, Oswestry Disability Index category descriptors. Available from: FindJewel[employer]cz  Minimally Clinically Important Difference (MCID) = 12.8%  COGNITION: Overall cognitive status: Within functional limits for tasks assessed     SENSATION: Light touch: Impaired  on RLE   POSTURE: rounded shoulders, forward head, and increased thoracic kyphosis  PALPATION: TTP Throughout thoracic and lumbar spine w/ CPA grade 1-2 Very stiff Tight musculature throughout Inc discomfort with palpation to sacrum   LUMBAR ROM:   AROM eval  Flexion Mid shin, * LBP  Extension 25% avail, *  LBP and RLE  Right lateral flexion   Left lateral flexion   Right rotation 25% avail, *Low back, RLE, and shoulder  Left rotation 25% avail, *Low back, RLE, and shoulder   (Blank rows = not tested)    *=painful  LOWER EXTREMITY ROM:     Active  Right eval Left eval  Hip flexion    Hip extension    Hip abduction    Hip adduction    Hip internal rotation    Hip external rotation    Knee flexion    Knee extension    Ankle dorsiflexion    Ankle plantarflexion     Ankle inversion    Ankle eversion     (Blank rows = not tested)  LOWER EXTREMITY MMT:    MMT Right eval Left eval  Hip flexion 3+ 4-  Hip extension 3+ 4-  Hip abduction 4- 4-  Hip adduction    Hip internal rotation    Hip external rotation    Knee flexion    Knee extension    Ankle dorsiflexion 5 5  Ankle plantarflexion    Ankle inversion    Ankle eversion     (Blank rows = not tested)  LUMBAR SPECIAL TESTS:  Straight leg raise test: Positive  FUNCTIONAL TESTS:  30 seconds chair stand test 2 minute walk test: Next session  8/20: 165 feet 30 second chair stand: 7 STS, RLE pain (7/10)  8/20:  8X no pain  GAIT: Distance walked: 100 Assistive device utilized: None Level of assistance: Complete Independence Comments: Dec hip ext/flex bilaterally and dec trunk rotation, causing somewhat of a waddle (hip hike bilaterally)  TREATMENT DATE:  04/11/24 Goal review :  165 feet no AD Standing:  hip excursion 10X each direction Supine: while lying on moist heat LTR 10X5  SKTC with towel 5X5  TA bracing 10X5  March with bracing 10X Seated:  scapular retraction 10X5  Long sitting hamsting stretch 2X20 each side STS no UE 10X  04/05/24: PT Eval and HEP                                                                                                                                PATIENT EDUCATION:  Education details: PT evaluation, objective findings, POC, Importance of HEP, Precautions, Clinic policies  Person educated: Patient Education method: Explanation and Demonstration Education comprehension: verbalized understanding and returned demonstration  HOME EXERCISE PROGRAM: Access Code: [email or web address] URL: [email or web address] Date: 04/05/2024 Prepared by: Rosaria Powell-Butler  Exercises - Supine Lower Trunk Rotation  - 2 x daily - 7 x weekly - 2 sets - 10 reps - Supine Single Knee to Double Knee to Chest Stretch  - 2 x daily - 7 x weekly - 2 sets -  10 reps, w/ towel - Supine Transversus Abdominis Bracing - Hands on Stomach  - 2 x daily - 7 x weekly - 2 sets - 10 reps - 5 hold  ASSESSMENT:  CLINICAL IMPRESSION:  Reviewed goals and POC moving forward.  2 minute walk test completed with short pause/rest standing break due to fatigue and pain (clock was stopped during this pause).  Pt required verbal, tactile cues and demonstration to recall HEP and complete in correct form. New exercises were added based on evaluation findings/deficits.   Heat used with supine exercises to help relax mm.  Pt c/o pain in knees, back, Rt LE and Lt shoulder with all exercises completed today, however did not complete in slow controlled movements.   Explained to patient she would have a little discomfort with new activity and that was expected but not pain.  Educated to work to the pain not through it. No new exercises added to HEP today as needs to be fully compliant with initial HEP.  Stressed importance of completing these with pt verbalizing understanding.   Patient will benefit from continued skilled physical therapy in order to address the above to improve overall function and quality of life.    OBJECTIVE IMPAIRMENTS: Abnormal gait, decreased activity tolerance, decreased balance, decreased endurance, decreased mobility, difficulty walking, decreased ROM, decreased strength, hypomobility, improper body mechanics, postural dysfunction, and pain.   ACTIVITY LIMITATIONS: carrying, lifting, bending, sitting, standing, squatting, stairs, and transfers  PARTICIPATION LIMITATIONS: meal prep, cleaning, laundry, community activity, and yard work  PERSONAL FACTORS: N/A are also affecting patient's functional outcome.   REHAB POTENTIAL: Good  CLINICAL DECISION MAKING: Stable/uncomplicated  EVALUATION COMPLEXITY: Low   GOALS: Goals reviewed with patient? Yes  SHORT TERM GOALS: Target date: 04/26/24 Patient will be independent with performance of HEP to  demonstrate adequate self management of symptoms.  Baseline:  Goal status: IN PROGRESS  2.   Patient will report at least a 25% improvement with function and/or pain reduction overall since beginning PT. Baseline:  Goal status:  IN PROGRESS  LONG TERM GOALS: Target date: 05/31/24 Patient will improve Modified Oswestry score by 12.8 % in order to demonstrate improved self-perceived disability and overall function while meeting MCID.  Baseline: Goal status:  IN PROGRESS   2.  Patient will improve  30 second chair stand test by at least 2 STS  in order to demonstrate improved LE strength and endurance required for community ambulation. Baseline:  Goal status: IN PROGRESS   3.  Patient will report pain rating of 5/10 or less following completion of every day ADL/iADLs.  Baseline:  Goal status:  IN PROGRESS   4.  Patient will report overall 50% improvement since beginning PT. Baseline:  Goal status: I IN PROGRESS   PLAN:  PT FREQUENCY: 1x/week  PT DURATION: 8 weeks  PLANNED INTERVENTIONS: 97164- PT Re-evaluation, 97110-Therapeutic exercises, 97530- Therapeutic activity, [id number]- Neuromuscular re-education, 97535- Self Care, [id number]- Manual therapy, [id number]- Gait training, [phone]- Electrical stimulation (manual), [id number]- Traction (mechanical), [phone] (1-2 muscles), [id number] (3+ muscles)- Dry Needling, Patient/Family education, Balance training, Stair training, Taping, Joint mobilization, Spinal mobilization, Cryotherapy, and Moist heat.  PLAN FOR NEXT SESSION: Progress lumbar ROM, core strengthening, LE strengthening, STM to paraspinals, grade 2/3 CPA/UPA, try heat during supine activities  Greig KATHEE Fuse, PTA/CLT [facility] Ph: [phone]  11:39 AM, 04/11/24

## 2024-04-12 NOTE — Telephone Encounter (Signed)
 Called and let pt know that we have not received anything at this time. Asked pt to call PCP to see if she can move the process forward. Pt verbalized understanding.

## 2024-04-13 ENCOUNTER — Encounter: Payer: Self-pay | Admitting: Radiology

## 2024-04-16 ENCOUNTER — Ambulatory Visit (HOSPITAL_COMMUNITY)

## 2024-04-16 DIAGNOSIS — M5459 Other low back pain: Secondary | ICD-10-CM | POA: Diagnosis not present

## 2024-04-16 DIAGNOSIS — Z7409 Other reduced mobility: Secondary | ICD-10-CM

## 2024-04-16 DIAGNOSIS — M79604 Pain in right leg: Secondary | ICD-10-CM

## 2024-04-16 NOTE — Therapy (Signed)
 OUTPATIENT PHYSICAL THERAPY THORACOLUMBAR TREATMENT   Patient Name: Misty Cruz MRN: [id number] DOB:06/29/1961, 63 y.o., female Today's Date: 04/16/2024  END OF SESSION:  PT End of Session - 04/16/24 1002     Visit Number 3    Number of Visits 16    Date for PT Re-Evaluation 05/03/24    Authorization Type [employer] HMO/PPO    Authorization Time Period No auth    PT Start Time 0935    PT Stop Time 1015    PT Time Calculation (min) 40 min    Activity Tolerance Patient limited by pain    Behavior During Therapy [facility] for tasks assessed/performed           Past Medical History:  Diagnosis Date   Diabetes mellitus    History of kidney stones    Hypercholesteremia    Hypertension    Hyperthyroidism    Thyroid disease    Past Surgical History:  Procedure Laterality Date   ABDOMINAL HYSTERECTOMY     BREAST BIOPSY Right 07/14/2023   MM RT BREAST BX W LOC DEV 1ST LESION IMAGE BX SPEC STEREO GUIDE 07/14/2023 [facility]   COLONOSCOPY N/A 06/04/2013   Procedure: COLONOSCOPY;  Surgeon: Margo LITTIE Haddock, MD;  Location: [facility];  Service: Endoscopy;  Laterality: N/A;  9:30 AM   CYSTOSCOPY WITH RETROGRADE PYELOGRAM, URETEROSCOPY AND STENT PLACEMENT Bilateral 03/03/2023   Procedure: CYSTOSCOPY WITH RETROGRADE PYELOGRAM, URETEROSCOPY AND STENT PLACEMENT;  Surgeon: Sherrilee Belvie LITTIE, MD;  Location: [facility];  Service: Urology;  Laterality: Bilateral;   CYSTOSCOPY/RETROGRADE/URETEROSCOPY/STONE EXTRACTION WITH BASKET Bilateral 03/01/2024   Procedure: CYSTOSCOPY, WITH CALCULUS REMOVAL USING BASKET;  Surgeon: Sherrilee Belvie LITTIE, MD;  Location: [facility];  Service: Urology;  Laterality: Bilateral;   CYSTOSCOPY/URETEROSCOPY/HOLMIUM LASER/STENT PLACEMENT Bilateral 03/01/2024   Procedure: BILATERAL CYSTOSCOPY/URETEROSCOPY/HOLMIUM LASER,RIGHT STENT PLACEMENT;  Surgeon: Sherrilee Belvie LITTIE, MD;  Location: [facility];  Service: Urology;  Laterality: Bilateral;   HOLMIUM LASER APPLICATION  Bilateral 03/03/2023   Procedure: HOLMIUM LASER APPLICATION;  Surgeon: Sherrilee Belvie LITTIE, MD;  Location: [facility];  Service: Urology;  Laterality: Bilateral;   HOLMIUM LASER APPLICATION Bilateral 03/01/2024   Procedure: HOLMIUM LASER APPLICATION;  Surgeon: Sherrilee Belvie LITTIE, MD;  Location: [facility];  Service: Urology;  Laterality: Bilateral;   STONE EXTRACTION WITH BASKET Bilateral 03/03/2023   Procedure: STONE EXTRACTION WITH BASKET;  Surgeon: Sherrilee Belvie LITTIE, MD;  Location: [facility];  Service: Urology;  Laterality: Bilateral;   Patient Active Problem List   Diagnosis Date Noted   Arthritis of right knee 03/20/2024   Hematuria 03/20/2024   Hypothyroidism 03/20/2024   Obesity 03/20/2024   Seasonal allergies 03/20/2024   Hypertensive disorder 11/30/2023   Former smoker 11/30/2023   Hyperlipemia 11/30/2023   Hyperlipidemia associated with type 2 diabetes mellitus (HCC) 11/30/2023   Vitamin D deficiency 11/30/2023   Nephrolithiasis 03/03/2023   Diabetes mellitus (HCC) 12/10/2015    PCP: Margarete Maeola DASEN, FNP  REFERRING PROVIDER: Darnella Dorn SAUNDERS, MD  REFERRING DIAG: M41.26 (ICD-10-CM) - Other idiopathic scoliosis, lumbar region  Rationale for Evaluation and Treatment: Rehabilitation  THERAPY DIAG:  Other low back pain  Low back pain radiating to right leg  Impaired functional mobility, balance, gait, and endurance  ONSET DATE: Long time Chronic  SUBJECTIVE:  SUBJECTIVE STATEMENT: Pt reports her back is always hurting, she feels like she waddles when she walks and is confused at why it is taking so long to get her L shoulder to get surgery because it hurts so bad. Pain of 7/10 pain in lower back and into Rt LE. Reports HEP makes her back hurt too.  Evaluation:  Patient reports her back pain  has been going on for years. Reports she has numbness/tingling into RLE, down to toes. Come and goes Insidiously. Reports she also has pain into RUE that they said is coming from her back as well, feels knots in R arm.   PERTINENT HISTORY:  Had kidney stones L shoulder rotator cuff tear (current, sees MD soon regarding plan)   PAIN:  Are you having pain? Yes: NPRS scale: 7/10 currently, (can get up to a 9) Pain location: Mid-low back  Pain description: Dull ache Aggravating factors: Bending down Relieving factors: Rest, pain meds   PRECAUTIONS: None  RED FLAGS: None   WEIGHT BEARING RESTRICTIONS: No  FALLS:  Has patient fallen in last 6 months? No  LIVING ENVIRONMENT: Stairs: Yes: External: 1 steps; can reach both Has following equipment at home: None  OCCUPATION: N/A  PLOF: Independent  PATIENT GOALS: To try to get better  NEXT MD VISIT: Not sure  OBJECTIVE:  Note: Objective measures were completed at Evaluation unless otherwise noted.  DIAGNOSTIC FINDINGS:  IMPRESSION: 1. Scoliotic curvature convex to the right with the apex at L2-3. 2. 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 No apparent change since the study of 12/17/2023. 3. 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 4. 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 5. 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  PATIENT SURVEYS:  Modified Oswestry: Modified Oswestry Low Back Pain Disability Questionnaire: 23 / 50 = 46.0 %  Interpretation of scores: Score Category Description  0-20% Minimal Disability The patient can cope with most living activities. Usually no treatment is indicated apart from advice on lifting, sitting and exercise  21-40% Moderate Disability The patient experiences more pain and difficulty with sitting, lifting and standing. Travel and social  life are more difficult and they may be disabled from work. Personal care, sexual activity and sleeping are not grossly affected, and the patient can usually be managed by conservative means  41-60% Severe Disability Pain remains the main problem in this group, but activities of daily living are affected. These patients require a detailed investigation  61-80% Crippled Back pain impinges on all aspects of the patient's life. Positive intervention is required  81-100% Bed-bound  These patients are either bed-bound or exaggerating their symptoms  Bluford FORBES Zoe DELENA Karon DELENA, et al. Surgery versus conservative management of stable thoracolumbar fracture: the PRESTO feasibility RCT. [place]): [employer]; 2021 Nov. [facility] Technology Assessment, No. 25.62.) Appendix 3, Oswestry Disability Index category descriptors. Available from: FindJewel[employer].cz  Minimally Clinically Important Difference (MCID) = 12.8%  COGNITION: Overall cognitive status: Within functional limits for tasks assessed     SENSATION: Light touch: Impaired  on RLE   POSTURE: rounded shoulders, forward head, and increased thoracic kyphosis  PALPATION: TTP Throughout thoracic and lumbar spine w/ CPA grade 1-2 Very stiff Tight musculature throughout Inc discomfort with palpation to sacrum   LUMBAR ROM:   AROM eval  Flexion Mid shin, * LBP  Extension 25% avail, *LBP and RLE  Right  lateral flexion   Left lateral flexion   Right rotation 25% avail, *Low back, RLE, and shoulder  Left rotation 25% avail, *Low back, RLE, and shoulder   (Blank rows = not tested)    *=painful  LOWER EXTREMITY ROM:     Active  Right eval Left eval  Hip flexion    Hip extension    Hip abduction    Hip adduction    Hip internal rotation    Hip external rotation    Knee flexion    Knee extension    Ankle dorsiflexion    Ankle plantarflexion    Ankle inversion    Ankle eversion      (Blank rows = not tested)  LOWER EXTREMITY MMT:    MMT Right eval Left eval  Hip flexion 3+ 4-  Hip extension 3+ 4-  Hip abduction 4- 4-  Hip adduction    Hip internal rotation    Hip external rotation    Knee flexion    Knee extension    Ankle dorsiflexion 5 5  Ankle plantarflexion    Ankle inversion    Ankle eversion     (Blank rows = not tested)  LUMBAR SPECIAL TESTS:  Straight leg raise test: Positive  FUNCTIONAL TESTS:  30 seconds chair stand test 2 minute walk test: Next session  8/20: 165 feet 30 second chair stand: 7 STS, RLE pain (7/10)  8/20:  8X no pain  GAIT: Distance walked: 100 Assistive device utilized: None Level of assistance: Complete Independence Comments: Dec hip ext/flex bilaterally and dec trunk rotation, causing somewhat of a waddle (hip hike bilaterally)  TREATMENT DATE:  04/16/24 Supine w/ elevated   - LTR x 15 reps  - TrA bracing w/ tactile hand cue under back   - SKTC 5 x 10s holds  - HS activation w/ ball under feet (w/ cryotherapy on back in attempts to neural signaling in attempts to reduce pain)  - Attempted bridges w/ increased pain in LB therefore deferred  Standing  - Wall slide and lateral distraction with R UE sliding up wall  - Rotation and lateral flexion to the L side in order to offload facets on R side Education on continuing to perform gentle mobility for improving tolerance to ADL Gait training  - w/ use of cane in L UE in attempts to shift to L side and offload R with increased L shoulder pain therefore deferred further cane training  - w/o use of cane and cues for upright tolerance w/ difficulty in maintaining but reported slight relief  04/11/24 Goal review :  165 feet no AD Standing:  hip excursion 10X each direction Supine: while lying on moist heat LTR 10X5  SKTC with towel 5X5  TA bracing 10X5  March with bracing 10X Seated:  scapular retraction 10X5  Long sitting hamsting stretch 2X20 each side STS  no UE 10X  04/05/24: PT Eval and HEP  PATIENT EDUCATION:  Education details: PT evaluation, objective findings, POC, Importance of HEP, Precautions, Clinic policies  Person educated: Patient Education method: Explanation and Demonstration Education comprehension: verbalized understanding and returned demonstration  HOME EXERCISE PROGRAM: Access Code: [email or web address] URL: [email or web address] Date: 04/05/2024 Prepared by: Rosaria Powell-Butler  Exercises - Supine Lower Trunk Rotation  - 2 x daily - 7 x weekly - 2 sets - 10 reps - Supine Single Knee to Double Knee to Chest Stretch  - 2 x daily - 7 x weekly - 2 sets - 10 reps, w/ towel - Supine Transversus Abdominis Bracing - Hands on Stomach  - 2 x daily - 7 x weekly - 2 sets - 10 reps - 5 hold  ASSESSMENT:  CLINICAL IMPRESSION:  Pt tolerance to session fair. Increased pain with multiple interventions requiring cues for pelvic tilt appropriate mechanics as well as cues for gentle mobility and relaxation in R LE secondary to tightness and tension help in R side noted. C/o pain in L shoulder with gait training, R LE during supine interventions, and back pain with gait training. Difficulty completing exercises in slow controlled movements.  Explained to patient she would have a little discomfort with new activity and to not push through pain. Plan to continue with gait training and offloading R facets / improve general mobility and core activation through future interventions. Patient will benefit from continued skilled physical therapy in order to address the above to improve overall function and quality of life.    OBJECTIVE IMPAIRMENTS: Abnormal gait, decreased activity tolerance, decreased balance, decreased endurance, decreased mobility, difficulty walking, decreased ROM, decreased strength, hypomobility,  improper body mechanics, postural dysfunction, and pain.   ACTIVITY LIMITATIONS: carrying, lifting, bending, sitting, standing, squatting, stairs, and transfers  PARTICIPATION LIMITATIONS: meal prep, cleaning, laundry, community activity, and yard work  PERSONAL FACTORS: N/A are also affecting patient's functional outcome.   REHAB POTENTIAL: Good  CLINICAL DECISION MAKING: Stable/uncomplicated  EVALUATION COMPLEXITY: Low   GOALS: Goals reviewed with patient? Yes  SHORT TERM GOALS: Target date: 04/26/24 Patient will be independent with performance of HEP to demonstrate adequate self management of symptoms.  Baseline:  Goal status: IN PROGRESS  2.   Patient will report at least a 25% improvement with function and/or pain reduction overall since beginning PT. Baseline:  Goal status:  IN PROGRESS  LONG TERM GOALS: Target date: 05/31/24 Patient will improve Modified Oswestry score by 12.8 % in order to demonstrate improved self-perceived disability and overall function while meeting MCID.  Baseline: Goal status:  IN PROGRESS   2.  Patient will improve  30 second chair stand test by at least 2 STS  in order to demonstrate improved LE strength and endurance required for community ambulation. Baseline:  Goal status: IN PROGRESS   3.  Patient will report pain rating of 5/10 or less following completion of every day ADL/iADLs.  Baseline:  Goal status:  IN PROGRESS   4.  Patient will report overall 50% improvement since beginning PT. Baseline:  Goal status: I IN PROGRESS   PLAN:  PT FREQUENCY: 1x/week  PT DURATION: 8 weeks  PLANNED INTERVENTIONS: 97164- PT Re-evaluation, 97110-Therapeutic exercises, 97530- Therapeutic activity, [id number]- Neuromuscular re-education, 97535- Self Care, [id number]- Manual therapy, [id number]- Gait training, [phone]- Electrical stimulation (manual), [id number]- Traction (mechanical), [phone] (1-2 muscles), [id number] (3+ muscles)- Dry Needling, Patient/Family education, Balance  training, Stair training, Taping, Joint mobilization, Spinal mobilization, Cryotherapy, and Moist heat.  PLAN FOR NEXT SESSION: Cuing for PPT / transverse abdominus activation interventions;  gait training with lateral L lean to offload R side; Progress lumbar ROM, core strengthening, LE strengthening, STM to paraspinals, grade 2/3 CPA/UPA  Lamarr LITTIE Citrin PT, DPT [facility] [place] [phone] office   11:47 AM, 04/16/24

## 2024-04-17 ENCOUNTER — Telehealth: Payer: Self-pay | Admitting: Orthopedic Surgery

## 2024-04-17 NOTE — Telephone Encounter (Signed)
 Patient is aware of MD response to continue both medications.

## 2024-04-17 NOTE — Telephone Encounter (Signed)
 Dr. Onesimo pt - spoke w/the pt and her daughter, they stated that her PCP sent over clearance papers for her surgery.

## 2024-04-18 NOTE — Telephone Encounter (Signed)
 Dr. Onesimo pt - Verneita Rummer, pt's daughter lvm about the clearance papers and wants to know if we have them and wants to know if she can get her surgery scheduled before Dr. JAYSON returns.  Please call the pt, she or the daughter calls a lot.  [phone]

## 2024-04-24 ENCOUNTER — Telehealth: Payer: Self-pay | Admitting: Orthopedic Surgery

## 2024-04-24 NOTE — Telephone Encounter (Signed)
 Dr. Onesimo pt - Misty Cruz pt's daughter lvm for the pt, wants to know if her surgery has been scheduled, wants it done asap.  Would like a call back [phone]

## 2024-04-25 ENCOUNTER — Ambulatory Visit (HOSPITAL_COMMUNITY): Attending: Orthopaedic Surgery | Admitting: Physical Therapy

## 2024-04-25 ENCOUNTER — Encounter: Payer: Self-pay | Admitting: Orthopedic Surgery

## 2024-04-25 DIAGNOSIS — M5459 Other low back pain: Secondary | ICD-10-CM | POA: Insufficient documentation

## 2024-04-25 DIAGNOSIS — M545 Low back pain, unspecified: Secondary | ICD-10-CM | POA: Insufficient documentation

## 2024-04-25 DIAGNOSIS — M79604 Pain in right leg: Secondary | ICD-10-CM | POA: Diagnosis present

## 2024-04-25 DIAGNOSIS — Z7409 Other reduced mobility: Secondary | ICD-10-CM | POA: Diagnosis present

## 2024-04-25 NOTE — Telephone Encounter (Signed)
 Dr. Onesimo pt - pt is here in the lobby now and wants to speak to someone regarding her surgery.

## 2024-04-25 NOTE — Therapy (Signed)
 OUTPATIENT PHYSICAL THERAPY THORACOLUMBAR TREATMENT   Patient Name: Misty Cruz MRN: [id number] DOB:Nov 02, 1960, 63 y.o., female Today's Date: 04/25/2024  END OF SESSION:  PT End of Session - 04/25/24 0929     Visit Number 4    Number of Visits 16    Date for PT Re-Evaluation 05/03/24    Authorization Type [employer] HMO/PPO    Authorization Time Period No auth    PT Start Time 0930    PT Stop Time 1010    PT Time Calculation (min) 40 min    Activity Tolerance Patient limited by pain    Behavior During Therapy [facility] for tasks assessed/performed           Past Medical History:  Diagnosis Date   Diabetes mellitus    History of kidney stones    Hypercholesteremia    Hypertension    Hyperthyroidism    Thyroid disease    Past Surgical History:  Procedure Laterality Date   ABDOMINAL HYSTERECTOMY     BREAST BIOPSY Right 07/14/2023   MM RT BREAST BX W LOC DEV 1ST LESION IMAGE BX SPEC STEREO GUIDE 07/14/2023 [facility]   COLONOSCOPY N/A 06/04/2013   Procedure: COLONOSCOPY;  Surgeon: Margo LITTIE Haddock, MD;  Location: [facility];  Service: Endoscopy;  Laterality: N/A;  9:30 AM   CYSTOSCOPY WITH RETROGRADE PYELOGRAM, URETEROSCOPY AND STENT PLACEMENT Bilateral 03/03/2023   Procedure: CYSTOSCOPY WITH RETROGRADE PYELOGRAM, URETEROSCOPY AND STENT PLACEMENT;  Surgeon: Sherrilee Belvie LITTIE, MD;  Location: [facility];  Service: Urology;  Laterality: Bilateral;   CYSTOSCOPY/RETROGRADE/URETEROSCOPY/STONE EXTRACTION WITH BASKET Bilateral 03/01/2024   Procedure: CYSTOSCOPY, WITH CALCULUS REMOVAL USING BASKET;  Surgeon: Sherrilee Belvie LITTIE, MD;  Location: [facility];  Service: Urology;  Laterality: Bilateral;   CYSTOSCOPY/URETEROSCOPY/HOLMIUM LASER/STENT PLACEMENT Bilateral 03/01/2024   Procedure: BILATERAL CYSTOSCOPY/URETEROSCOPY/HOLMIUM LASER,RIGHT STENT PLACEMENT;  Surgeon: Sherrilee Belvie LITTIE, MD;  Location: [facility];  Service: Urology;  Laterality: Bilateral;   HOLMIUM LASER APPLICATION  Bilateral 03/03/2023   Procedure: HOLMIUM LASER APPLICATION;  Surgeon: Sherrilee Belvie LITTIE, MD;  Location: [facility];  Service: Urology;  Laterality: Bilateral;   HOLMIUM LASER APPLICATION Bilateral 03/01/2024   Procedure: HOLMIUM LASER APPLICATION;  Surgeon: Sherrilee Belvie LITTIE, MD;  Location: [facility];  Service: Urology;  Laterality: Bilateral;   STONE EXTRACTION WITH BASKET Bilateral 03/03/2023   Procedure: STONE EXTRACTION WITH BASKET;  Surgeon: Sherrilee Belvie LITTIE, MD;  Location: [facility];  Service: Urology;  Laterality: Bilateral;   Patient Active Problem List   Diagnosis Date Noted   Arthritis of right knee 03/20/2024   Hematuria 03/20/2024   Hypothyroidism 03/20/2024   Obesity 03/20/2024   Seasonal allergies 03/20/2024   Hypertensive disorder 11/30/2023   Former smoker 11/30/2023   Hyperlipemia 11/30/2023   Hyperlipidemia associated with type 2 diabetes mellitus (HCC) 11/30/2023   Vitamin D deficiency 11/30/2023   Nephrolithiasis 03/03/2023   Diabetes mellitus (HCC) 12/10/2015    PCP: Margarete Maeola DASEN, FNP  REFERRING PROVIDER: Darnella Dorn SAUNDERS, MD  REFERRING DIAG: M41.26 (ICD-10-CM) - Other idiopathic scoliosis, lumbar region  Rationale for Evaluation and Treatment: Rehabilitation  THERAPY DIAG:  Other low back pain  Low back pain radiating to right leg  Impaired functional mobility, balance, gait, and endurance  ONSET DATE: Long time Chronic  SUBJECTIVE:  SUBJECTIVE STATEMENT: Pt reports her leg is bothering her the most at 8/10 pain today. Reports doing some and little by little but very vague on her HEP compliance.  Unable to specify which exercises are increasing her pain.   Evaluation:  Patient reports her back pain has been going on for years. Reports she has numbness/tingling  into RLE, down to toes. Come and goes Insidiously. Reports she also has pain into RUE that they said is coming from her back as well, feels knots in R arm.   PERTINENT HISTORY:  Had kidney stones L shoulder rotator cuff tear (current, sees MD soon regarding plan)   PAIN:  Are you having pain? Yes: NPRS scale: 7/10 currently, (can get up to a 9) Pain location: Mid-low back  Pain description: Dull ache Aggravating factors: Bending down Relieving factors: Rest, pain meds   PRECAUTIONS: None  RED FLAGS: None   WEIGHT BEARING RESTRICTIONS: No  FALLS:  Has patient fallen in last 6 months? No  LIVING ENVIRONMENT: Stairs: Yes: External: 1 steps; can reach both Has following equipment at home: None  OCCUPATION: N/A  PLOF: Independent  PATIENT GOALS: To try to get better  NEXT MD VISIT: Not sure  OBJECTIVE:  Note: Objective measures were completed at Evaluation unless otherwise noted.  DIAGNOSTIC FINDINGS:  IMPRESSION: 1. Scoliotic curvature convex to the right with the apex at L2-3. 2. 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 No apparent change since the study of 12/17/2023. 3. 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 4. 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 5. 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  PATIENT SURVEYS:  Modified Oswestry: Modified Oswestry Low Back Pain Disability Questionnaire: 23 / 50 = 46.0 %  Interpretation of scores: Score Category Description  0-20% Minimal Disability The patient can cope with most living activities. Usually no treatment is indicated apart from advice on lifting, sitting and exercise  21-40% Moderate Disability The patient experiences more pain and difficulty with sitting, lifting and standing. Travel and social life are more difficult and they may be disabled from work.  Personal care, sexual activity and sleeping are not grossly affected, and the patient can usually be managed by conservative means  41-60% Severe Disability Pain remains the main problem in this group, but activities of daily living are affected. These patients require a detailed investigation  61-80% Crippled Back pain impinges on all aspects of the patient's life. Positive intervention is required  81-100% Bed-bound  These patients are either bed-bound or exaggerating their symptoms  Bluford FORBES Zoe DELENA Karon DELENA, et al. Surgery versus conservative management of stable thoracolumbar fracture: the PRESTO feasibility RCT. [place] ([place]): [employer]; 2021 Nov. [facility] Technology Assessment, No. 25.62.) Appendix 3, Oswestry Disability Index category descriptors. Available from: FindJewel[employer].cz  Minimally Clinically Important Difference (MCID) = 12.8%  COGNITION: Overall cognitive status: Within functional limits for tasks assessed     SENSATION: Light touch: Impaired  on RLE   POSTURE: rounded shoulders, forward head, and increased thoracic kyphosis  PALPATION: TTP Throughout thoracic and lumbar spine w/ CPA grade 1-2 Very stiff Tight musculature throughout Inc discomfort with palpation to sacrum   LUMBAR ROM:   AROM eval  Flexion Mid shin, * LBP  Extension 25% avail, *LBP and RLE  Right lateral flexion   Left lateral flexion   Right rotation 25% avail, *Low back, RLE, and shoulder  Left rotation 25% avail, *Low back, RLE, and shoulder   (Blank rows = not tested)    *=painful  LOWER EXTREMITY ROM:     Active  Right eval Left eval Right 04/25/24 Left 04/25/24  Hip flexion      Hip extension      Hip abduction      Hip adduction      Hip internal rotation      Hip external rotation      Knee flexion   80 80  Knee extension      Ankle dorsiflexion      Ankle plantarflexion      Ankle inversion      Ankle eversion       (Blank  rows = not tested)  LOWER EXTREMITY MMT:    MMT Right eval Left eval  Hip flexion 3+ 4-  Hip extension 3+ 4-  Hip abduction 4- 4-  Hip adduction    Hip internal rotation    Hip external rotation    Knee flexion    Knee extension    Ankle dorsiflexion 5 5  Ankle plantarflexion    Ankle inversion    Ankle eversion     (Blank rows = not tested)  LUMBAR SPECIAL TESTS:  Straight leg raise test: Positive  FUNCTIONAL TESTS:  30 seconds chair stand test 2 minute walk test: Next session  8/20: 165 feet 30 second chair stand: 7 STS, RLE pain (7/10)  8/20:  8X no pain  GAIT: Distance walked: 100 Assistive device utilized: None Level of assistance: Complete Independence Comments: Dec hip ext/flex bilaterally and dec trunk rotation, causing somewhat of a waddle (hip hike bilaterally)  TREATMENT DATE:  04/25/24 Standing:  hip excursion 10X each direction (very stiff) Supine: Bridge 2X10 LTR 10X5  March with bracing 10X  SLR 10X each LE  Attempted piriformis stretch but too tight   Hamstring stretch with towel 2X30 each LE  Seated:  scapular retraction 10X5  Attempted piriformis stretch, too tight to get leg up   Sit to stands no UE 5X   04/16/24 Supine w/ elevated   - LTR x 15 reps  - TrA bracing w/ tactile hand cue under back   - SKTC 5 x 10s holds  - HS activation w/ ball under feet (w/ cryotherapy on back in attempts to neural signaling in attempts to reduce pain)  - Attempted bridges w/ increased pain in LB therefore deferred  Standing  - Wall slide and lateral distraction with R UE sliding up wall  - Rotation and lateral flexion to the L side in order to offload facets on R side Education on continuing to perform gentle mobility for improving tolerance to ADL Gait training  - w/ use of cane in L UE in attempts to shift to L side and offload R with increased L shoulder pain therefore deferred further cane training  - w/o use of cane and cues for upright tolerance  w/ difficulty in maintaining but reported slight relief  04/11/24 Goal review :  165 feet no AD Standing:  hip excursion 10X each direction Supine: while lying on moist heat LTR 10X5  SKTC with towel 5X5  TA bracing 10X5  March with bracing 10X Seated:  scapular retraction 10X5  Long sitting hamsting stretch 2X20 each side STS no UE 10X  04/05/24: PT Eval and HEP  PATIENT EDUCATION:  Education details: PT evaluation, objective findings, POC, Importance of HEP, Precautions, Clinic policies  Person educated: Patient Education method: Explanation and Demonstration Education comprehension: verbalized understanding and returned demonstration  HOME EXERCISE PROGRAM: Access Code: [email or web address] URL: [email or web address] Date: 04/05/2024 Prepared by: Rosaria Powell-Butler  Exercises - Supine Lower Trunk Rotation  - 2 x daily - 7 x weekly - 2 sets - 10 reps - Supine Single Knee to Double Knee to Chest Stretch  - 2 x daily - 7 x weekly - 2 sets - 10 reps, w/ towel - Supine Transversus Abdominis Bracing - Hands on Stomach  - 2 x daily - 7 x weekly - 2 sets - 10 reps - 5 hold  04/25/24: hip excursions (lumbar extension, lateral flexion and rotation) 10X each direction  ASSESSMENT:  CLINICAL IMPRESSION: Began session with discussion on HEP compliance, difference between pain and soreness.  Pt quick on stating every activity hurts but increased tolerance with reduced ROM and more controlled motions.  Pt reported improvement with lumbar extension so encouraged this motion.  Attempted piriformis stretch, however too tight to assume position or get close to stretch.  Noted knees have reduced mobility at 80 degrees also making mobility challenging.  Updated HEP to include hip excursions today.  Cues needed with all activities for count, hold times and  completing exercises in slow controlled movements.  Plan to continue with gait training and offloading R facets / improve general mobility and core activation through future interventions. Patient will benefit from continued skilled physical therapy in order to address the above to improve overall function and quality of life.    OBJECTIVE IMPAIRMENTS: Abnormal gait, decreased activity tolerance, decreased balance, decreased endurance, decreased mobility, difficulty walking, decreased ROM, decreased strength, hypomobility, improper body mechanics, postural dysfunction, and pain.   ACTIVITY LIMITATIONS: carrying, lifting, bending, sitting, standing, squatting, stairs, and transfers  PARTICIPATION LIMITATIONS: meal prep, cleaning, laundry, community activity, and yard work  PERSONAL FACTORS: N/A are also affecting patient's functional outcome.   REHAB POTENTIAL: Good  CLINICAL DECISION MAKING: Stable/uncomplicated  EVALUATION COMPLEXITY: Low   GOALS: Goals reviewed with patient? Yes  SHORT TERM GOALS: Target date: 04/26/24 Patient will be independent with performance of HEP to demonstrate adequate self management of symptoms.  Baseline:  Goal status: IN PROGRESS  2.   Patient will report at least a 25% improvement with function and/or pain reduction overall since beginning PT. Baseline:  Goal status:  IN PROGRESS  LONG TERM GOALS: Target date: 05/31/24 Patient will improve Modified Oswestry score by 12.8 % in order to demonstrate improved self-perceived disability and overall function while meeting MCID.  Baseline: Goal status:  IN PROGRESS   2.  Patient will improve  30 second chair stand test by at least 2 STS  in order to demonstrate improved LE strength and endurance required for community ambulation. Baseline:  Goal status: IN PROGRESS   3.  Patient will report pain rating of 5/10 or less following completion of every day ADL/iADLs.  Baseline:  Goal status:  IN PROGRESS    4.  Patient will report overall 50% improvement since beginning PT. Baseline:  Goal status: I IN PROGRESS   PLAN:  PT FREQUENCY: 1x/week  PT DURATION: 8 weeks  PLANNED INTERVENTIONS: 97164- PT Re-evaluation, 97110-Therapeutic exercises, 97530- Therapeutic activity, [id number]- Neuromuscular re-education, 97535- Self Care, [id number]- Manual therapy, [id number]- Gait training, [phone]- Electrical stimulation (manual), [id number]- Traction (mechanical), [phone] (1-2 muscles), [id number] (3+ muscles)- Dry Needling, Patient/Family education, Balance training, Stair  training, Taping, Joint mobilization, Spinal mobilization, Cryotherapy, and Moist heat.  PLAN FOR NEXT SESSION: Cuing for PPT / transverse abdominus activation interventions; gait training with lateral L lean to offload R side; Progress lumbar ROM, core strengthening, LE strengthening, STM to paraspinals, grade 2/3 CPA/UPA  Greig KATHEE Fuse, PTA/CLT [facility] Ph: [phone]  9:29 AM, 04/25/24

## 2024-04-25 NOTE — Telephone Encounter (Signed)
 Spoke w/ pt and let her know her PCP stated her BP needs to be better controlled. Went over some of the surgical risks that could arise and why we need PCP clearance to move forward. Advised her to contact her PCP for assistance, pt verbalized understanding.

## 2024-04-25 NOTE — Telephone Encounter (Signed)
 Spoke w/ pt today. Documented in latest call.

## 2024-05-02 ENCOUNTER — Telehealth: Payer: Self-pay | Admitting: Orthopedic Surgery

## 2024-05-02 ENCOUNTER — Encounter (HOSPITAL_COMMUNITY)

## 2024-05-02 NOTE — Telephone Encounter (Signed)
 Misty Cruz called from [facility] called and needed a clearance letter for her. CB#[id number]

## 2024-05-03 NOTE — Telephone Encounter (Signed)
 Form faxed again.

## 2024-05-04 ENCOUNTER — Ambulatory Visit (HOSPITAL_COMMUNITY)

## 2024-05-04 ENCOUNTER — Telehealth: Payer: Self-pay | Admitting: Orthopedic Surgery

## 2024-05-04 ENCOUNTER — Encounter (HOSPITAL_COMMUNITY): Payer: Self-pay

## 2024-05-04 DIAGNOSIS — M5459 Other low back pain: Secondary | ICD-10-CM | POA: Diagnosis not present

## 2024-05-04 DIAGNOSIS — M545 Low back pain, unspecified: Secondary | ICD-10-CM

## 2024-05-04 DIAGNOSIS — Z7409 Other reduced mobility: Secondary | ICD-10-CM

## 2024-05-04 NOTE — Telephone Encounter (Signed)
 We're in clinic and pretty busy. Please let pt know that we do not have the letter from Cardiology yet.

## 2024-05-04 NOTE — Telephone Encounter (Signed)
 Dr. Onesimo pt - pt presented to the office, sitting in the lobby, wants to speak to someone to see if our office has received everything they need so she can proceed w/surgery.  I did advise it may be a minute, they are in clinic, she verbalized understanding.

## 2024-05-04 NOTE — Telephone Encounter (Signed)
 I was busy and didn't see your Leta's response, thank you for coming to speak w/her.

## 2024-05-04 NOTE — Therapy (Signed)
 OUTPATIENT PHYSICAL THERAPY THORACOLUMBAR TREATMENT   Patient Name: Misty Cruz MRN: [id number] DOB:[birth year], 63 y.o., female Today's Date: 05/04/2024   PHYSICAL THERAPY DISCHARGE SUMMARY  Visits from Start of Care: 5  Current functional level related to goals / functional outcomes: See below   Remaining deficits: See below   Education / Equipment: See below   Patient agrees to discharge. Patient goals were not met. Patient is being discharged due to did not respond to therapy.  And lack of progress.    END OF SESSION:  PT End of Session - 05/04/24 1020     Visit Number 5    Number of Visits 16    Date for PT Re-Evaluation --    Authorization Type [employer] HMO/PPO    Authorization Time Period No auth    PT Start Time 1020    PT Stop Time 1058    PT Time Calculation (min) 38 min    Activity Tolerance Patient limited by pain    Behavior During Therapy WFL for tasks assessed/performed           Past Medical History:  Diagnosis Date   Diabetes mellitus    History of kidney stones    Hypercholesteremia    Hypertension    Hyperthyroidism    Thyroid disease    Past Surgical History:  Procedure Laterality Date   ABDOMINAL HYSTERECTOMY     BREAST BIOPSY Right 07/14/2023   MM RT BREAST BX W LOC DEV 1ST LESION IMAGE BX SPEC STEREO GUIDE 07/14/2023 [facility]   COLONOSCOPY N/A 06/04/2013   Procedure: COLONOSCOPY;  Surgeon: Margo LITTIE Haddock, MD;  Location: [facility];  Service: Endoscopy;  Laterality: N/A;  9:30 AM   CYSTOSCOPY WITH RETROGRADE PYELOGRAM, URETEROSCOPY AND STENT PLACEMENT Bilateral 03/03/2023   Procedure: CYSTOSCOPY WITH RETROGRADE PYELOGRAM, URETEROSCOPY AND STENT PLACEMENT;  Surgeon: Sherrilee Belvie LITTIE, MD;  Location: [facility];  Service: Urology;  Laterality: Bilateral;   CYSTOSCOPY/RETROGRADE/URETEROSCOPY/STONE EXTRACTION WITH BASKET Bilateral 03/01/2024   Procedure: CYSTOSCOPY, WITH CALCULUS REMOVAL USING BASKET;  Surgeon:  Sherrilee Belvie LITTIE, MD;  Location: [facility];  Service: Urology;  Laterality: Bilateral;   CYSTOSCOPY/URETEROSCOPY/HOLMIUM LASER/STENT PLACEMENT Bilateral 03/01/2024   Procedure: BILATERAL CYSTOSCOPY/URETEROSCOPY/HOLMIUM LASER,RIGHT STENT PLACEMENT;  Surgeon: Sherrilee Belvie LITTIE, MD;  Location: [facility];  Service: Urology;  Laterality: Bilateral;   HOLMIUM LASER APPLICATION Bilateral 03/03/2023   Procedure: HOLMIUM LASER APPLICATION;  Surgeon: Sherrilee Belvie LITTIE, MD;  Location: [facility];  Service: Urology;  Laterality: Bilateral;   HOLMIUM LASER APPLICATION Bilateral 03/01/2024   Procedure: HOLMIUM LASER APPLICATION;  Surgeon: Sherrilee Belvie LITTIE, MD;  Location: [facility];  Service: Urology;  Laterality: Bilateral;   STONE EXTRACTION WITH BASKET Bilateral 03/03/2023   Procedure: STONE EXTRACTION WITH BASKET;  Surgeon: Sherrilee Belvie LITTIE, MD;  Location: [facility];  Service: Urology;  Laterality: Bilateral;   Patient Active Problem List   Diagnosis Date Noted   Arthritis of right knee 03/20/2024   Hematuria 03/20/2024   Hypothyroidism 03/20/2024   Obesity 03/20/2024   Seasonal allergies 03/20/2024   Hypertensive disorder 11/30/2023   Former smoker 11/30/2023   Hyperlipemia 11/30/2023   Hyperlipidemia associated with type 2 diabetes mellitus (HCC) 11/30/2023   Vitamin D deficiency 11/30/2023   Nephrolithiasis 03/03/2023   Diabetes mellitus (HCC) 12/10/2015    PCP: Margarete Maeola DASEN, FNP  REFERRING PROVIDER: Darnella Dorn SAUNDERS, MD  REFERRING DIAG: M41.26 (ICD-10-CM) - Other idiopathic scoliosis, lumbar region  Rationale for Evaluation and Treatment: Rehabilitation  THERAPY DIAG:  Other low back pain  Low back pain radiating to right leg  Impaired functional mobility, balance, gait, and endurance  ONSET DATE: Long time Chronic  SUBJECTIVE:                                                                                                                                                                                            SUBJECTIVE STATEMENT: Pt reports continued 8/10 pain in low back that's radiating to RLE. Reports her foot is asleep a lot of the time. Reports she does some of her HEP at least every other day but still pretty challenging. Just found out her husband has cancer. Going through a lot.     Evaluation:  Patient reports her back pain has been going on for years. Reports she has numbness/tingling into RLE, down to toes. Come and goes Insidiously. Reports she also has pain into RUE that they said is coming from her back as well, feels knots in R arm.   PERTINENT HISTORY:  Had kidney stones L shoulder rotator cuff tear (current, sees MD soon regarding plan)   PAIN:  Are you having pain? Yes: NPRS scale: 7/10 currently, (can get up to a 9) Pain location: Mid-low back  Pain description: Dull ache Aggravating factors: Bending down Relieving factors: Rest, pain meds   PRECAUTIONS: None  RED FLAGS: None   WEIGHT BEARING RESTRICTIONS: No  FALLS:  Has patient fallen in last 6 months? No  LIVING ENVIRONMENT: Stairs: Yes: External: 1 steps; can reach both Has following equipment at home: None  OCCUPATION: N/A  PLOF: Independent  PATIENT GOALS: To try to get better  NEXT MD VISIT: Not sure  OBJECTIVE:  Note: Objective measures were completed at Evaluation unless otherwise noted.  DIAGNOSTIC FINDINGS:  IMPRESSION: 1. Scoliotic curvature convex to the right with the apex at L2-3. 2. 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 No apparent change since the study of 12/17/2023. 3. 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 4. 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 5. 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  PATIENT SURVEYS:  Modified Oswestry: Modified Oswestry Low Back Pain Disability Questionnaire: 23 /  50 = 46.0 %  05/04/24: Modified Oswestry Low Back Pain Disability Questionnaire: 32 / 50 = 64.0 %   Interpretation of scores: Score Category Description  0-20% Minimal Disability The patient can cope with most living activities. Usually no treatment is indicated apart from advice on lifting, sitting and exercise  21-40% Moderate Disability The patient experiences more pain  and difficulty with sitting, lifting and standing. Travel and social life are more difficult and they may be disabled from work. Personal care, sexual activity and sleeping are not grossly affected, and the patient can usually be managed by conservative means  41-60% Severe Disability Pain remains the main problem in this group, but activities of daily living are affected. These patients require a detailed investigation  61-80% Crippled Back pain impinges on all aspects of the patient's life. Positive intervention is required  81-100% Bed-bound  These patients are either bed-bound or exaggerating their symptoms  Bluford FORBES Zoe DELENA Karon DELENA, et al. Surgery versus conservative management of stable thoracolumbar fracture: the PRESTO feasibility RCT. [place] ([place]): [employer]; 2021 Nov. [facility] Technology Assessment, No. 25.62.) Appendix 3, Oswestry Disability Index category descriptors. Available from: FindJewel[employer]cz  Minimally Clinically Important Difference (MCID) = 12.8%  COGNITION: Overall cognitive status: Within functional limits for tasks assessed     SENSATION: Light touch: Impaired  on RLE   POSTURE: rounded shoulders, forward head, and increased thoracic kyphosis  PALPATION: TTP Throughout thoracic and lumbar spine w/ CPA grade 1-2 Very stiff Tight musculature throughout Inc discomfort with palpation to sacrum   LUMBAR ROM:   AROM eval 05/04/24  Flexion Mid shin, * LBP To knee joint, *  Extension 25% avail, *LBP and RLE 25% avail, **  Right lateral flexion     Left lateral flexion    Right rotation 25% avail, *Low back, RLE, and shoulder 10% avail, ** more pain going to RLE  Left rotation 25% avail, *Low back, RLE, and shoulder 25% avail, *   (Blank rows = not tested)    *=painful  **=most pain  LOWER EXTREMITY ROM:     Active  Right eval Left eval Right 04/25/24 Left 04/25/24 R 05/04/24 L 05/04/24  Hip flexion        Hip extension        Hip abduction        Hip adduction        Hip internal rotation        Hip external rotation        Knee flexion   80 80 85 90  Knee extension        Ankle dorsiflexion        Ankle plantarflexion        Ankle inversion        Ankle eversion         (Blank rows = not tested)  LOWER EXTREMITY MMT:    MMT Right eval Left eval R 05/04/24 L 05/04/24  Hip flexion 3+ 4- 3+ 4-  Hip extension 3+ 4- 3+ 4  Hip abduction 4- 4- 4- 4-  Hip adduction      Hip internal rotation      Hip external rotation      Knee flexion      Knee extension      Ankle dorsiflexion 5 5    Ankle plantarflexion      Ankle inversion      Ankle eversion       (Blank rows = not tested)  LUMBAR SPECIAL TESTS:  Straight leg raise test: Positive  FUNCTIONAL TESTS:  30 seconds chair stand test 2 minute walk test: Next session  8/20: 165 feet 30 second chair stand: 7 STS, RLE pain (7/10)  8/20:  8X no pain  05/04/24: 30 second chair stand: 5 STS, reports 9/10 pain  2 minute walk test: 117', pt  demo very slow pace, Multiple standing rest breaks, pt grasping at low back in pain, cont waddle   GAIT: Distance walked: 100 Assistive device utilized: None Level of assistance: Complete Independence Comments: Dec hip ext/flex bilaterally and dec trunk rotation, causing somewhat of a waddle (hip hike bilaterally)  TREATMENT DATE:  05/04/24: Re-evaluation:  Modified Oswestry Lumbar ROM LE MMT Knee flexion ROM 30 second chair stand test 2 minute walk test Review of goals Discussion about POC  04/25/24 Standing:  hip  excursion 10X each direction (very stiff) Supine: Bridge 2X10 LTR 10X5  March with bracing 10X  SLR 10X each LE  Attempted piriformis stretch but too tight   Hamstring stretch with towel 2X30 each LE  Seated:  scapular retraction 10X5  Attempted piriformis stretch, too tight to get leg up   Sit to stands no UE 5X   04/16/24 Supine w/ elevated   - LTR x 15 reps  - TrA bracing w/ tactile hand cue under back   - SKTC 5 x 10s holds  - HS activation w/ ball under feet (w/ cryotherapy on back in attempts to neural signaling in attempts to reduce pain)  - Attempted bridges w/ increased pain in LB therefore deferred  Standing  - Wall slide and lateral distraction with R UE sliding up wall  - Rotation and lateral flexion to the L side in order to offload facets on R side Education on continuing to perform gentle mobility for improving tolerance to ADL Gait training  - w/ use of cane in L UE in attempts to shift to L side and offload R with increased L shoulder pain therefore deferred further cane training  - w/o use of cane and cues for upright tolerance w/ difficulty in maintaining but reported slight relief   PATIENT EDUCATION:  Education details: PT evaluation, objective findings, POC, Importance of HEP, Precautions, Clinic policies  Person educated: Patient Education method: Explanation and Demonstration Education comprehension: verbalized understanding and returned demonstration  HOME EXERCISE PROGRAM: Access Code: [email or web address] URL: [email or web address] Date: 04/05/2024 Prepared by: Rosaria Powell-Butler  Exercises - Supine Lower Trunk Rotation  - 2 x daily - 7 x weekly - 2 sets - 10 reps - Supine Single Knee to Double Knee to Chest Stretch  - 2 x daily - 7 x weekly - 2 sets - 10 reps, w/ towel - Supine Transversus Abdominis Bracing - Hands on Stomach  - 2 x daily - 7 x weekly - 2 sets - 10 reps - 5 hold  04/25/24: hip excursions (lumbar extension, lateral flexion  and rotation) 10X each direction  ASSESSMENT:  CLINICAL IMPRESSION: Re-evaluation completed this date. Objectively, patient only demonstrates some progress with LE MMT. Patient has regressed with self perceived function via Modified Oswestry, lumbar ROM, and functional testing via 2 minute walk test and 30 second sit to stand testing. Additionally, pt continues to reports increased pain with functional transfers and ambulation, have altered gait pattern, and reports she does not feel physical therapy has been beneficial thus far as she sees no progress. Patient has not met any goals.  This PT encouraged patient to contact referring MD to discuss PT course and lack of progress from initial visit. Discussed best option now to be discharge, continue HEP, follow up with MD for further testing as PT services has made no notable progress for patient. PT to contact MD office next business date to discuss findings as well. Pt agreeable to all. Discharged to HEP this date.  OBJECTIVE IMPAIRMENTS: Abnormal gait, decreased activity tolerance, decreased balance, decreased endurance, decreased mobility, difficulty walking, decreased ROM, decreased strength, hypomobility, improper body mechanics, postural dysfunction, and pain.   ACTIVITY LIMITATIONS: carrying, lifting, bending, sitting, standing, squatting, stairs, and transfers  PARTICIPATION LIMITATIONS: meal prep, cleaning, laundry, community activity, and yard work  PERSONAL FACTORS: N/A are also affecting patient's functional outcome.   REHAB POTENTIAL: Good  CLINICAL DECISION MAKING: Stable/uncomplicated  EVALUATION COMPLEXITY: Low   GOALS: Goals reviewed with patient? Yes  SHORT TERM GOALS: Target date: 04/26/24 Patient will be independent with performance of HEP to demonstrate adequate self management of symptoms.  Baseline: Reports every other day compliance with HEP but still really difficult Goal status: NOT MET  2.   Patient will  report at least a 25% improvement with function and/or pain reduction overall since beginning PT. Baseline: Pt reports 0% improvement on 05/04/24 Goal status:  NOT MET  LONG TERM GOALS: Target date: 05/31/24 Patient will improve Modified Oswestry score by 12.8 % in order to demonstrate improved self-perceived disability and overall function while meeting MCID.  Baseline: Regression in score Goal status:  NOT MET   2.  Patient will improve  30 second chair stand test by at least 2 STS  in order to demonstrate improved LE strength and endurance required for community ambulation. Baseline: Regression in score Goal status: NOT MET   3.  Patient will report pain rating of 5/10 or less following completion of every day ADL/iADLs.  Baseline: Reports 7/10 on 05/04/24, regression Goal status:  NOT MET   4.  Patient will report overall 50% improvement since beginning PT. Baseline: Pt reports 0% on 05/04/24 Goal status: NOT MET   PLAN:  PT FREQUENCY: 1x/week  PT DURATION: 8 weeks  PLANNED INTERVENTIONS: 97164- PT Re-evaluation, 97110-Therapeutic exercises, 97530- Therapeutic activity, 97112- Neuromuscular re-education, 97535- Self Care, [id number]- Manual therapy, [id number]- Gait training, [phone]- Electrical stimulation (manual), [id number]- Traction (mechanical), [phone] (1-2 muscles), [id number] (3+ muscles)- Dry Needling, Patient/Family education, Balance training, Stair training, Taping, Joint mobilization, Spinal mobilization, Cryotherapy, and Moist heat.  PLAN FOR NEXT SESSION: pt discharged   10:59 AM, 05/04/24 Rosaria Settler, PT, DPT [facility]

## 2024-05-09 ENCOUNTER — Encounter (HOSPITAL_COMMUNITY)

## 2024-05-16 ENCOUNTER — Encounter (HOSPITAL_COMMUNITY): Admitting: Physical Therapy

## 2024-05-17 ENCOUNTER — Ambulatory Visit: Payer: Self-pay | Admitting: Orthopedic Surgery

## 2024-05-18 ENCOUNTER — Other Ambulatory Visit: Payer: Self-pay

## 2024-05-18 DIAGNOSIS — M75122 Complete rotator cuff tear or rupture of left shoulder, not specified as traumatic: Secondary | ICD-10-CM

## 2024-05-18 DIAGNOSIS — G8929 Other chronic pain: Secondary | ICD-10-CM

## 2024-05-23 ENCOUNTER — Encounter (HOSPITAL_COMMUNITY): Admitting: Physical Therapy

## 2024-05-24 ENCOUNTER — Telehealth: Payer: Self-pay | Admitting: Orthopedic Surgery

## 2024-05-24 ENCOUNTER — Telehealth: Payer: Self-pay

## 2024-05-24 DIAGNOSIS — M75122 Complete rotator cuff tear or rupture of left shoulder, not specified as traumatic: Secondary | ICD-10-CM

## 2024-05-24 DIAGNOSIS — G8929 Other chronic pain: Secondary | ICD-10-CM

## 2024-05-24 NOTE — Telephone Encounter (Signed)
 Dr. Onesimo pt - spoke w/Teresa Henry [phone] the pt's daughter, she stated that Medicare has denied the surgery and is requesting more information be faxed so they can review and determine if they will approve the appeal.  Fax to [phone]

## 2024-05-24 NOTE — Telephone Encounter (Signed)
 Patient's daughter called stating that her mom's surgery was denied. Stated that the letter stated that she had not had physical therapy. She gave this number for a fast appeal call: [phone].

## 2024-05-24 NOTE — Telephone Encounter (Signed)
 Are you able to assist me with this. I can call the patient if I need to.

## 2024-05-25 NOTE — Patient Instructions (Signed)
 Misty Cruz  05/25/2024     @PREFPERIOPPHARMACY @   Your procedure is scheduled on  06/04/2024.   Report to [facility] at  [phone]  A.M.   Call this number if you have problems the morning of surgery:  [phone]  If you experience any cold or flu symptoms such as cough, fever, chills, shortness of breath, etc. between now and your scheduled surgery, please notify us  at the above number.   Remember:        DO NOT take any medications for diabetes the morning of your procedure.   Do not eat after midnight.   You may drink clear liquids until  0400 am on 06/04/2024.      Clear liquids allowed are:                    Water , Juice (No red color; non-citric and without pulp; diabetics please choose diet or no sugar options), Carbonated beverages (diabetics please choose diet or no sugar options), Clear Tea (No creamer, milk, or cream, including half & half and powdered creamer), Black Coffee Only (No creamer, milk or cream, including half & half and powdered creamer), and Clear Sports drink (No red color; diabetics please choose diet or no sugar options)           At 0400 am on 06/04/2024 drink your arb drink. You can have nothing else to drink after this.   Take these medicines the morning of surgery with A SIP OF WATER         diazepam  (if needed), levothyroxine, methocarbamal, oxycodone (if needed).    Do not wear jewelry, make-up or nail polish, including gel polish,  artificial nails, or any other type of covering on natural nails (fingers and  toes).  Do not wear lotions, powders, or perfumes, or deodorant.  Do not shave 48 hours prior to surgery.  Men may shave face and neck.  Do not bring valuables to the hospital.  [facility] is not responsible for any belongings or valuables.  Contacts, dentures or bridgework may not be worn into surgery.  Leave your suitcase in the car.  After surgery it may be brought to your room.  For patients admitted to the  hospital, discharge time will be determined by your treatment team.  Patients discharged the day of surgery will not be allowed to drive home and must have someone with them for 24 hours.    Special instructions:   DO NOT smoke tobacco or vape for 24 hours before your procedure.  Please read over the following fact sheets that you were given. Pain Booklet, Coughing and Deep Breathing, Surgical Site Infection Prevention, Anesthesia Post-op Instructions, and Care and Recovery After Surgery      Reverse Total Shoulder Replacement Surgery: What to Know After After a reverse total shoulder replacement surgery, it's common to have pain and stiffness in your shoulder and arm. Follow these instructions at home: Medicines Take your medicines only as told. If you were given antibiotics, take them as told. Do not stop taking them even if you start to feel better. You may need to take steps to help treat or prevent trouble pooping (constipation), such as: Taking medicines to help you poop. Eating foods high in fiber, like beans, whole grains, and fresh fruits and vegetables. Drinking more fluids as told. Ask your health care provider if it's safe to drive or use machines while taking your medicine. If you have  a sling or immobilizer:  Wear the sling or immobilizer as told. Take it off only if your provider says you can. Check the skin around it every day. Tell your provider if you see problems. Loosen the sling if your fingers tingle, are numb, or turn cold and blue. Keep the sling or immobilizer clean and dry. Bathing Do not take baths, swim, or use a hot tub until you're told it's OK. Ask if you can shower. If your sling or immobilizer isn't waterproof: Do not let it get wet. Cover it when you take a bath or a shower. Use a cover that doesn't let any water  in. Keep your bandage dry until your provider says you can take it off. Caring for your cut from surgery  Take care of your cut from  surgery as told. Make sure you: Wash your hands with soap and water  for at least 20 seconds before and after you change your bandage. If you can't use soap and water , use hand sanitizer. Change your bandage. Leave stitches or skin glue alone. Leave tape strips alone unless you're told to take them off. You may trim the edges of the tape strips if they curl up. If you have a tube called a drain to remove extra fluid, care for it as told. Check the area around your cut every day for signs of infection. Check for: More redness, swelling, or pain. Fluid or blood. Warmth. Pus or a bad smell. Managing pain, stiffness, and swelling  Use ice or an ice pack as told. If you have a sling or immobilizer that you can take off, remove it only as told. Place a towel between your skin and the ice. Leave the ice on for 20 minutes, 2-3 times a day. If your skin turns red, take off the ice right away to prevent skin damage. The risk of damage is higher if you can't feel pain, heat, or cold. Move your fingers and hand often to reduce stiffness and swelling. Driving If you were given a sedative, do not drive or use machines until you're told it's safe. A sedative can make you sleepy. Ask when it's safe to drive if you have a sling or immobilizer on your arm. Activity Rest as told. Get up to take short walks many times during the day. This helps you breathe better and keeps your blood flowing. Ask for help if you feel weak or unsteady. Do not use your arm to push yourself up in bed or from a chair. Ask if it's OK for you to lift. Do not lift anything heavier than a cup of coffee for the first 6 weeks with the arm you had surgery on, or as told by your provider. Exercise as told. Try not to overuse your shoulder. Early overuse of the shoulder may result in later problems. Do not play contact sports. Ask what things are safe for you to do at home. Ask when you can go back to work or school. General  instructions Do not smoke, vape, or use nicotine or tobacco. Do not have dental work or cleanings for at least 3 months. Ask your provider if you need to take antibiotics before you have dental work or have your teeth cleaned. Tell your dentist about your new joint. Wear compression stockings to reduce swelling and prevent blood clots in your legs. Keep all follow-up visits. Your provider will make sure you're healing. Contact a health care provider if: You have a fever. Your arm tingles or  feels numb. Your pain gets worse, even after you take pain medicine. You have any signs of infection. Get help right away if: You have redness, swelling, pain, or warmth in your leg or arm. You have chest pain or shortness of breath. Your shoulder joint moves out of place. The edges of your cut break open after the stitches have been taken out. These symptoms may be an emergency. Call 911 right away. Do not wait to see if the symptoms will go away. Do not drive yourself to the hospital. This information is not intended to replace advice given to you by your health care provider. Make sure you discuss any questions you have with your health care provider. Document Revised: 07/25/2023 Document Reviewed: 07/25/2023 Elsevier Patient Education  2025 [employer]. How to Use an Incentive Spirometer An incentive spirometer is a tool that measures how well you are filling your lungs with each breath. Learning to take long, deep breaths using this tool can help you keep your lungs clear and active. This may help to reverse or lessen your chance of developing breathing (pulmonary) problems, especially infection. You may be asked to use a spirometer: After a surgery. If you have a lung problem or a history of smoking. After a long period of time when you have been unable to move or be active. If the spirometer includes an indicator to show the highest number that you have reached, your health care provider or  respiratory therapist will help you set a goal. Keep a log of your progress as told by your health care provider. What are the risks? Breathing too quickly may cause dizziness or cause you to pass out. Take your time so you do not get dizzy or light-headed. If you are in pain, you may need to take pain medicine before doing incentive spirometry. It is harder to take a deep breath if you are having pain. How to use your incentive spirometer  Sit up on the edge of your bed or on a chair. Hold the incentive spirometer so that it is in an upright position. Before you use the spirometer, breathe out normally. Place the mouthpiece in your mouth. Make sure your lips are closed tightly around it. Breathe in slowly and as deeply as you can through your mouth, causing the piston or the ball to rise toward the top of the chamber. Hold your breath for 3-5 seconds, or for as long as possible. If the spirometer includes a coach indicator, use this to guide you in breathing. Slow down your breathing if the indicator goes above the marked areas. Remove the mouthpiece from your mouth and breathe out normally. The piston or ball will return to the bottom of the chamber. Rest for a few seconds, then repeat the steps 10 or more times. Take your time and take a few normal breaths between deep breaths so that you do not get dizzy or light-headed. Do this every 1-2 hours when you are awake. If the spirometer includes a goal marker to show the highest number you have reached (best effort), use this as a goal to work toward during each repetition. After each set of 10 deep breaths, cough a few times. This will help to make sure that your lungs are clear. If you have an incision on your chest or abdomen from surgery, place a pillow or a rolled-up towel firmly against the incision when you cough. This can help to reduce pain while taking deep breaths and coughing. General  tips When you are able to get out of bed: Walk  around often. Continue to take deep breaths and cough in order to clear your lungs. Keep using the incentive spirometer until your health care provider says it is okay to stop using it. If you have been in the hospital, you may be told to keep using the spirometer at home. Contact a health care provider if: You are having difficulty using the spirometer. You have trouble using the spirometer as often as instructed. Your pain medicine is not giving enough relief for you to use the spirometer as told. You have a fever. Get help right away if: You develop shortness of breath. You develop a cough with bloody mucus from the lungs. You have fluid or blood coming from an incision site after you cough. Summary An incentive spirometer is a tool that can help you learn to take long, deep breaths to keep your lungs clear and active. You may be asked to use a spirometer after a surgery, if you have a lung problem or a history of smoking, or if you have been inactive for a long period of time. Use your incentive spirometer as instructed every 1-2 hours while you are awake. If you have an incision on your chest or abdomen, place a pillow or a rolled-up towel firmly against your incision when you cough. This will help to reduce pain. Get help right away if you have shortness of breath, you cough up bloody mucus, or blood comes from your incision when you cough. This information is not intended to replace advice given to you by your health care provider. Make sure you discuss any questions you have with your health care provider. Document Revised: 06/17/2023 Document Reviewed: 06/17/2023 Elsevier Patient Education  2024 [employer].How to Use Chlorhexidine  at Home in the Shower Chlorhexidine  gluconate (CHG) is a germ-killing (antiseptic) wash that's used to clean the skin. It can get rid of the germs that normally live on the skin and can keep them away for about 24 hours. If you're having surgery, you may  be told to shower with CHG at home the night before surgery. This can help lower your risk for infection. To use CHG wash in the shower, follow the steps below. Supplies needed: CHG body wash. Clean washcloth. Clean towel. How to use CHG in the shower Follow these steps unless you're told to use CHG in a different way: Start the shower. Use your normal soap and shampoo to wash your face and hair. Turn off the shower or move out of the shower stream. Pour CHG onto a clean washcloth. Do not use any type of brush or rough sponge. Start at your neck, washing your body down to your toes. Make sure you: Wash the part of your body where the surgery will be done for at least 1 minute. Do not scrub. Do not use CHG on your head or face unless your health care provider tells you to. If it gets into your ears or eyes, rinse them well with water . Do not wash your genitals with CHG. Wash your back and under your arms. Make sure to wash skin folds. Let the CHG sit on your skin for 1-2 minutes or as long as told. Rinse your entire body in the shower, including all body creases and folds. Turn off the shower. Dry off with a clean towel. Do not put anything on your skin afterward, such as powder, lotion, or perfume. Put on clean clothes or  pajamas. If it's the night before surgery, sleep in clean sheets. General tips Use CHG only as told, and follow the instructions on the label. Use the full amount of CHG as told. This is often one bottle. Do not smoke and stay away from flames after using CHG. Your skin may feel sticky after using CHG. This is normal. The sticky feeling will go away as the CHG dries. Do not use CHG: If you have a chlorhexidine  allergy or have reacted to chlorhexidine  in the past. On open wounds or areas of skin that have broken skin, cuts, or scrapes. On babies younger than 64 months of age. Contact a health care provider if: You have questions about using CHG. Your skin gets  irritated or itchy. You have a rash after using CHG. You swallow any CHG. Call your local poison control center [phone] in the [place] Your eyes itch badly, or they become very red or swollen. Your hearing changes. You have trouble seeing. If you can't reach your provider, go to an urgent care or emergency room. Do not drive yourself. Get help right away if: You have swelling or tingling in your mouth or throat. You make high-pitched whistling sounds when you breathe, most often when you breathe out (wheeze). You have trouble breathing. These symptoms may be an emergency. Call 911 right away. Do not wait to see if the symptoms will go away. Do not drive yourself to the hospital. This information is not intended to replace advice given to you by your health care provider. Make sure you discuss any questions you have with your health care provider. Document Revised: 02/22/2023 Document Reviewed: 02/18/2022 Elsevier Patient Education  2024 [employer].

## 2024-05-29 ENCOUNTER — Encounter (HOSPITAL_COMMUNITY): Payer: Self-pay

## 2024-05-29 ENCOUNTER — Encounter (HOSPITAL_COMMUNITY)
Admission: RE | Admit: 2024-05-29 | Discharge: 2024-05-29 | Disposition: A | Discharge status: Home / Self Care | Source: Ambulatory Visit | Attending: Orthopedic Surgery | Admitting: Orthopedic Surgery

## 2024-05-29 VITALS — BP 134/83 | HR 61 | Resp 20 | Ht 59.0 in | Wt 150.0 lb

## 2024-05-29 DIAGNOSIS — E119 Type 2 diabetes mellitus without complications: Secondary | ICD-10-CM | POA: Insufficient documentation

## 2024-05-29 DIAGNOSIS — Z01812 Encounter for preprocedural laboratory examination: Secondary | ICD-10-CM | POA: Diagnosis not present

## 2024-05-29 DIAGNOSIS — Z01818 Encounter for other preprocedural examination: Secondary | ICD-10-CM

## 2024-05-29 DIAGNOSIS — E0800 Diabetes mellitus due to underlying condition with hyperosmolarity without nonketotic hyperglycemic-hyperosmolar coma (NKHHC): Secondary | ICD-10-CM

## 2024-05-29 HISTORY — DX: Scoliosis, unspecified: M41.9

## 2024-05-29 HISTORY — DX: Anxiety disorder, unspecified: F41.9

## 2024-05-29 LAB — CBC WITH DIFFERENTIAL/PLATELET
Abs Immature Granulocytes: 0.01 K/uL (ref 0.00–0.07)
Basophils Absolute: 0 K/uL (ref 0.0–0.1)
Basophils Relative: 0 %
Eosinophils Absolute: 0.1 K/uL (ref 0.0–0.5)
Eosinophils Relative: 2 %
HCT: 36.7 % (ref 36.0–46.0)
Hemoglobin: 12.3 g/dL (ref 12.0–15.0)
Immature Granulocytes: 0 %
Lymphocytes Relative: 36 %
Lymphs Abs: 2.1 K/uL (ref 0.7–4.0)
MCH: 33.2 pg (ref 26.0–34.0)
MCHC: 33.5 g/dL (ref 30.0–36.0)
MCV: 99.2 fL (ref 80.0–100.0)
Monocytes Absolute: 0.6 K/uL (ref 0.1–1.0)
Monocytes Relative: 10 %
Neutro Abs: 3 K/uL (ref 1.7–7.7)
Neutrophils Relative %: 52 %
Platelets: 184 K/uL (ref 150–400)
RBC: 3.7 MIL/uL — ABNORMAL LOW (ref 3.87–5.11)
RDW: 12.4 % (ref 11.5–15.5)
WBC: 5.8 K/uL (ref 4.0–10.5)
nRBC: 0 % (ref 0.0–0.2)

## 2024-05-29 LAB — BASIC METABOLIC PANEL WITH GFR
Anion gap: 15 (ref 5–15)
BUN: 21 mg/dL (ref 8–23)
CO2: 22 mmol/L (ref 22–32)
Calcium: 10 mg/dL (ref 8.9–10.3)
Chloride: 98 mmol/L (ref 98–111)
Creatinine, Ser: 0.87 mg/dL (ref 0.44–1.00)
GFR, Estimated: >60 mL/min (ref >60)
Glucose, Bld: 195 mg/dL — ABNORMAL HIGH (ref 70–99)
Potassium: 4.3 mmol/L (ref 3.5–5.1)
Sodium: 134 mmol/L — ABNORMAL LOW (ref 135–145)

## 2024-05-29 LAB — SURGICAL PCR SCREEN
MRSA, PCR: NEGATIVE
Staphylococcus aureus: POSITIVE — AB

## 2024-05-30 ENCOUNTER — Encounter (HOSPITAL_COMMUNITY): Admitting: Physical Therapy

## 2024-05-30 ENCOUNTER — Telehealth: Payer: Self-pay

## 2024-05-30 LAB — HEMOGLOBIN A1C
Hgb A1c MFr Bld: 5.5 % (ref 4.8–5.6)
Mean Plasma Glucose: 111 mg/dL

## 2024-05-30 NOTE — Telephone Encounter (Signed)
 Called pt to let her know her surgery will be postponed. Insurance will not approve procedure until after therapy, pt verbalized understanding.

## 2024-05-30 NOTE — Telephone Encounter (Signed)
 LVM w/ OT letting them know pt needs therapy so we can get her surgery approved. Call back information given incase of questions.

## 2024-05-30 NOTE — Telephone Encounter (Signed)
 [facility] OT called wanting to know when Dr. Onesimo wants this patient to start therapy. Stated that it wasn't on the order and pt didn't know.  [phone]

## 2024-05-31 ENCOUNTER — Encounter: Payer: Self-pay | Admitting: Orthopedic Surgery

## 2024-06-01 ENCOUNTER — Telehealth: Payer: Self-pay | Admitting: *Deleted

## 2024-06-01 ENCOUNTER — Encounter: Payer: Self-pay | Admitting: Orthopedic Surgery

## 2024-06-01 NOTE — Telephone Encounter (Signed)
 Just received this, please advise.

## 2024-06-01 NOTE — Telephone Encounter (Signed)
 Pt daughter called and left vm stating pt is needing a callback in regarding the approval  I called pt back at the number given of [phone] no answer, left vm for pt to return my call and also left on vm the dates available for surgery. Pending call back from pt.

## 2024-06-01 NOTE — Telephone Encounter (Signed)
-----   Message from Nurse Emmie D sent at 06/01/2024 12:03 PM EDT ----- Regarding: Surgery Date Please send message to Dr. Onesimo if she responds. It was a Clinical cytogeneticist message or a phone call.

## 2024-06-01 NOTE — Telephone Encounter (Signed)
 Called pt and left message again. Left message to call office on Monday to discuss what days she would like to be scheduled for surgery.

## 2024-06-03 ENCOUNTER — Ambulatory Visit: Payer: Self-pay | Admitting: Orthopedic Surgery

## 2024-06-04 ENCOUNTER — Encounter (HOSPITAL_COMMUNITY): Admission: RE | Payer: Self-pay | Source: Home / Self Care

## 2024-06-04 ENCOUNTER — Telehealth: Payer: Self-pay

## 2024-06-04 ENCOUNTER — Ambulatory Visit (HOSPITAL_COMMUNITY): Admission: RE | Admit: 2024-06-04 | Source: Home / Self Care | Admitting: Orthopedic Surgery

## 2024-06-04 SURGERY — ARTHROPLASTY, SHOULDER, TOTAL, REVERSE
Anesthesia: Choice | Site: Shoulder | Laterality: Left

## 2024-06-04 NOTE — Telephone Encounter (Signed)
 Patient called wanting to find out about her surgery. Her daughter called for the same thing and the patient just walked in to find out about her surgery. I gave her the Pre-Op number and told her to call them, that they should be able to help her with her questions.

## 2024-06-05 ENCOUNTER — Ambulatory Visit (HOSPITAL_COMMUNITY): Admitting: Occupational Therapy

## 2024-06-05 ENCOUNTER — Encounter (HOSPITAL_COMMUNITY)
Admission: RE | Admit: 2024-06-05 | Discharge: 2024-06-05 | Disposition: A | Discharge status: Home / Self Care | Source: Ambulatory Visit | Attending: Orthopedic Surgery | Admitting: Orthopedic Surgery

## 2024-06-07 ENCOUNTER — Encounter (HOSPITAL_COMMUNITY)
Admission: RE | Disposition: A | Discharge status: Home / Self Care | Payer: Self-pay | Source: Home / Self Care | Attending: Orthopedic Surgery

## 2024-06-07 ENCOUNTER — Encounter (HOSPITAL_COMMUNITY): Payer: Self-pay | Admitting: Orthopedic Surgery

## 2024-06-07 ENCOUNTER — Observation Stay (HOSPITAL_COMMUNITY)
Admission: RE | Admit: 2024-06-07 | Discharge: 2024-06-08 | Disposition: A | Discharge status: Home / Self Care | Attending: Orthopedic Surgery | Admitting: Orthopedic Surgery

## 2024-06-07 ENCOUNTER — Other Ambulatory Visit: Payer: Self-pay

## 2024-06-07 ENCOUNTER — Encounter (HOSPITAL_COMMUNITY): Payer: Self-pay | Admitting: Certified Registered"

## 2024-06-07 ENCOUNTER — Ambulatory Visit (HOSPITAL_COMMUNITY): Payer: Self-pay | Admitting: Certified Registered"

## 2024-06-07 ENCOUNTER — Ambulatory Visit (HOSPITAL_COMMUNITY)

## 2024-06-07 DIAGNOSIS — Z7982 Long term (current) use of aspirin: Secondary | ICD-10-CM | POA: Insufficient documentation

## 2024-06-07 DIAGNOSIS — Z7984 Long term (current) use of oral hypoglycemic drugs: Secondary | ICD-10-CM | POA: Insufficient documentation

## 2024-06-07 DIAGNOSIS — M75122 Complete rotator cuff tear or rupture of left shoulder, not specified as traumatic: Secondary | ICD-10-CM | POA: Diagnosis not present

## 2024-06-07 DIAGNOSIS — Z79899 Other long term (current) drug therapy: Secondary | ICD-10-CM | POA: Diagnosis not present

## 2024-06-07 DIAGNOSIS — M25512 Pain in left shoulder: Secondary | ICD-10-CM | POA: Diagnosis present

## 2024-06-07 DIAGNOSIS — Z23 Encounter for immunization: Secondary | ICD-10-CM | POA: Diagnosis not present

## 2024-06-07 DIAGNOSIS — M75102 Unspecified rotator cuff tear or rupture of left shoulder, not specified as traumatic: Principal | ICD-10-CM | POA: Diagnosis present

## 2024-06-07 DIAGNOSIS — Z87891 Personal history of nicotine dependence: Secondary | ICD-10-CM | POA: Diagnosis not present

## 2024-06-07 DIAGNOSIS — I10 Essential (primary) hypertension: Secondary | ICD-10-CM | POA: Diagnosis not present

## 2024-06-07 DIAGNOSIS — E119 Type 2 diabetes mellitus without complications: Secondary | ICD-10-CM | POA: Diagnosis not present

## 2024-06-07 HISTORY — PX: REVERSE SHOULDER ARTHROPLASTY: SHX5054

## 2024-06-07 LAB — GLUCOSE, CAPILLARY
Glucose-Capillary: 219 mg/dL — ABNORMAL HIGH (ref 70–99)
Glucose-Capillary: 314 mg/dL — ABNORMAL HIGH (ref 70–99)
Glucose-Capillary: 310 mg/dL — ABNORMAL HIGH (ref 70–99)
Glucose-Capillary: 316 mg/dL — ABNORMAL HIGH (ref 70–99)
Glucose-Capillary: 309 mg/dL — ABNORMAL HIGH (ref 70–99)
Glucose-Capillary: 356 mg/dL — ABNORMAL HIGH (ref 70–99)

## 2024-06-07 LAB — HEMOGLOBIN AND HEMATOCRIT, BLOOD
HCT: 33.4 % — ABNORMAL LOW (ref 36.0–46.0)
Hemoglobin: 10.9 g/dL — ABNORMAL LOW (ref 12.0–15.0)

## 2024-06-07 SURGERY — ARTHROPLASTY, SHOULDER, TOTAL, REVERSE
Anesthesia: General | Site: Shoulder | Laterality: Left

## 2024-06-07 MED ORDER — FENTANYL CITRATE (PF) 50 MCG/ML IJ SOSY: 25.0000 ug | PREFILLED_SYRINGE | INTRAMUSCULAR | Status: DC | PRN

## 2024-06-07 MED ORDER — ONDANSETRON HCL 4 MG PO TABS: 4.0000 mg | ORAL_TABLET | Freq: Four times a day (QID) | ORAL | Status: DC | PRN

## 2024-06-07 MED ORDER — VANCOMYCIN HCL 1000 MG IV SOLR
INTRAVENOUS | Status: AC
  Filled 2024-06-07: qty 20

## 2024-06-07 MED ORDER — VANCOMYCIN HCL 1000 MG IV SOLR
INTRAVENOUS | Status: DC | PRN
  Administered 2024-06-07: 1000 mg via TOPICAL

## 2024-06-07 MED ORDER — LISINOPRIL 10 MG PO TABS
20.0000 mg | ORAL_TABLET | Freq: Every day | ORAL | Status: DC
  Administered 2024-06-08: 20 mg via ORAL
  Filled 2024-06-07: qty 2

## 2024-06-07 MED ORDER — ROCURONIUM BROMIDE 10 MG/ML (PF) SYRINGE
PREFILLED_SYRINGE | INTRAVENOUS | Status: AC
  Filled 2024-06-07: qty 10

## 2024-06-07 MED ORDER — LIDOCAINE HCL (CARDIAC) PF 50 MG/5ML IV SOSY
PREFILLED_SYRINGE | INTRAVENOUS | Status: DC | PRN
  Administered 2024-06-07: 1 mL via INTRAVENOUS

## 2024-06-07 MED ORDER — PROPOFOL 10 MG/ML IV BOLUS
INTRAVENOUS | Status: AC
  Filled 2024-06-07: qty 20

## 2024-06-07 MED ORDER — LORATADINE 10 MG PO TABS
10.0000 mg | ORAL_TABLET | Freq: Every day | ORAL | Status: DC
  Administered 2024-06-07 – 2024-06-08 (×2): 10 mg via ORAL
  Filled 2024-06-07 (×2): qty 1

## 2024-06-07 MED ORDER — SODIUM CHLORIDE 0.9 % IR SOLN
Status: DC | PRN
  Administered 2024-06-07: 3000 mL

## 2024-06-07 MED ORDER — DIPHENHYDRAMINE HCL 12.5 MG/5ML PO ELIX: 12.5000 mg | ORAL_SOLUTION | ORAL | Status: DC | PRN

## 2024-06-07 MED ORDER — INSULIN ASPART 100 UNIT/ML IJ SOLN
10.0000 [IU] | Freq: Once | INTRAMUSCULAR | Status: AC
  Administered 2024-06-07: 10 [IU] via SUBCUTANEOUS

## 2024-06-07 MED ORDER — CHLORHEXIDINE GLUCONATE 0.12 % MT SOLN
15.0000 mL | Freq: Once | OROMUCOSAL | Status: AC
  Administered 2024-06-07: 15 mL via OROMUCOSAL

## 2024-06-07 MED ORDER — CELECOXIB 100 MG PO CAPS
100.0000 mg | ORAL_CAPSULE | Freq: Two times a day (BID) | ORAL | Status: AC
Start: 2024-06-07 — End: ?
  Administered 2024-06-07 – 2024-06-08 (×3): 100 mg via ORAL
  Filled 2024-06-07 (×3): qty 1

## 2024-06-07 MED ORDER — TRANEXAMIC ACID-NACL 1000-0.7 MG/100ML-% IV SOLN
1000.0000 mg | INTRAVENOUS | Status: AC
Start: 2024-06-07 — End: 2024-06-07
  Administered 2024-06-07: 1000 mg via INTRAVENOUS
  Filled 2024-06-07: qty 100

## 2024-06-07 MED ORDER — STERILE WATER FOR IRRIGATION IR SOLN
Status: DC | PRN
  Administered 2024-06-07: 1000 mL

## 2024-06-07 MED ORDER — MIDAZOLAM HCL 2 MG/2ML IJ SOLN
INTRAMUSCULAR | Status: DC | PRN
Start: 2024-06-07 — End: 2024-06-07
  Administered 2024-06-07: 2 mg via INTRAVENOUS

## 2024-06-07 MED ORDER — OXYCODONE HCL 5 MG PO TABS
5.0000 mg | ORAL_TABLET | Freq: Once | ORAL | Status: AC | PRN
  Administered 2024-06-07: 5 mg via ORAL
  Filled 2024-06-07: qty 1

## 2024-06-07 MED ORDER — ONDANSETRON HCL 4 MG/2ML IJ SOLN
INTRAMUSCULAR | Status: DC | PRN
  Administered 2024-06-07: 4 mg via INTRAVENOUS

## 2024-06-07 MED ORDER — BUPIVACAINE HCL (PF) 0.5 % IJ SOLN
INTRAMUSCULAR | Status: AC
  Filled 2024-06-07: qty 30

## 2024-06-07 MED ORDER — METFORMIN HCL 500 MG PO TABS
500.0000 mg | ORAL_TABLET | Freq: Two times a day (BID) | ORAL | Status: DC
  Administered 2024-06-07 – 2024-06-08 (×2): 500 mg via ORAL
  Filled 2024-06-07 (×2): qty 1

## 2024-06-07 MED ORDER — MIDAZOLAM HCL 2 MG/2ML IJ SOLN
INTRAMUSCULAR | Status: AC
  Filled 2024-06-07: qty 2

## 2024-06-07 MED ORDER — ONDANSETRON HCL 4 MG/2ML IJ SOLN
INTRAMUSCULAR | Status: AC
  Filled 2024-06-07: qty 2

## 2024-06-07 MED ORDER — GLIPIZIDE 5 MG PO TABS
10.0000 mg | ORAL_TABLET | Freq: Every day | ORAL | Status: DC
  Administered 2024-06-08: 10 mg via ORAL
  Filled 2024-06-07: qty 2

## 2024-06-07 MED ORDER — INFLUENZA VAC SPLIT HIGH-DOSE 0.5 ML IM SUSY
0.5000 mL | PREFILLED_SYRINGE | INTRAMUSCULAR | Status: AC
  Administered 2024-06-08: 0.5 mL via INTRAMUSCULAR
  Filled 2024-06-07: qty 0.5

## 2024-06-07 MED ORDER — ONDANSETRON HCL 4 MG/2ML IJ SOLN: 4.0000 mg | Freq: Once | INTRAMUSCULAR | Status: DC | PRN

## 2024-06-07 MED ORDER — OXYCODONE HCL 5 MG PO TABS
10.0000 mg | ORAL_TABLET | ORAL | Status: DC | PRN
  Administered 2024-06-07 (×2): 10 mg via ORAL
  Filled 2024-06-07 (×2): qty 2

## 2024-06-07 MED ORDER — ONDANSETRON HCL 4 MG/2ML IJ SOLN
4.0000 mg | Freq: Four times a day (QID) | INTRAMUSCULAR | Status: DC | PRN
  Administered 2024-06-07: 4 mg via INTRAVENOUS
  Filled 2024-06-07: qty 2

## 2024-06-07 MED ORDER — INSULIN ASPART 100 UNIT/ML IJ SOLN
10.0000 [IU] | Freq: Once | INTRAMUSCULAR | Status: AC
  Administered 2024-06-07: 10 [IU] via SUBCUTANEOUS
  Filled 2024-06-07: qty 0.1

## 2024-06-07 MED ORDER — LEVOTHYROXINE SODIUM 75 MCG PO TABS
75.0000 ug | ORAL_TABLET | Freq: Every morning | ORAL | Status: DC
  Administered 2024-06-08: 75 ug via ORAL
  Filled 2024-06-07: qty 1

## 2024-06-07 MED ORDER — OXYCODONE HCL 5 MG PO TABS
5.0000 mg | ORAL_TABLET | ORAL | Status: DC | PRN
  Administered 2024-06-08: 5 mg via ORAL
  Filled 2024-06-07: qty 1

## 2024-06-07 MED ORDER — PHENYLEPHRINE HCL-NACL 20-0.9 MG/250ML-% IV SOLN
INTRAVENOUS | Status: AC
  Filled 2024-06-07: qty 250

## 2024-06-07 MED ORDER — ROPIVACAINE HCL 5 MG/ML IJ SOLN
INTRAMUSCULAR | Status: DC | PRN
  Administered 2024-06-07 (×2): 15 mL via PERINEURAL

## 2024-06-07 MED ORDER — SODIUM CHLORIDE 0.9 % IR SOLN
Status: DC | PRN
  Administered 2024-06-07: 1000 mL

## 2024-06-07 MED ORDER — ROCURONIUM BROMIDE 10 MG/ML (PF) SYRINGE
PREFILLED_SYRINGE | INTRAVENOUS | Status: DC | PRN
  Administered 2024-06-07: 10 mg via INTRAVENOUS
  Administered 2024-06-07: 70 mg via INTRAVENOUS
  Administered 2024-06-07: 20 mg via INTRAVENOUS

## 2024-06-07 MED ORDER — LIDOCAINE HCL (PF) 1 % IJ SOLN
INTRAMUSCULAR | Status: AC
  Filled 2024-06-07: qty 2

## 2024-06-07 MED ORDER — INSULIN ASPART 100 UNIT/ML IJ SOLN
0.0000 [IU] | Freq: Three times a day (TID) | INTRAMUSCULAR | Status: DC
  Administered 2024-06-08: 8 [IU] via SUBCUTANEOUS

## 2024-06-07 MED ORDER — ACETAMINOPHEN 500 MG PO TABS
1000.0000 mg | ORAL_TABLET | Freq: Three times a day (TID) | ORAL | Status: DC
  Administered 2024-06-07 – 2024-06-08 (×3): 1000 mg via ORAL
  Filled 2024-06-07 (×3): qty 2

## 2024-06-07 MED ORDER — INSULIN ASPART 100 UNIT/ML IJ SOLN
0.0000 [IU] | INTRAMUSCULAR | Status: DC
Start: 2024-06-07 — End: 2024-06-07

## 2024-06-07 MED ORDER — ORAL CARE MOUTH RINSE: 15.0000 mL | Freq: Once | OROMUCOSAL | Status: AC

## 2024-06-07 MED ORDER — ATORVASTATIN CALCIUM 40 MG PO TABS
40.0000 mg | ORAL_TABLET | Freq: Every day | ORAL | Status: DC
  Administered 2024-06-08: 40 mg via ORAL
  Filled 2024-06-07: qty 1

## 2024-06-07 MED ORDER — LABETALOL HCL 5 MG/ML IV SOLN
INTRAVENOUS | Status: AC
  Filled 2024-06-07: qty 4

## 2024-06-07 MED ORDER — LACTATED RINGERS IV SOLN: INTRAVENOUS | Status: DC

## 2024-06-07 MED ORDER — OXYCODONE HCL 5 MG/5ML PO SOLN: 5.0000 mg | Freq: Once | ORAL | Status: AC | PRN

## 2024-06-07 MED ORDER — DEXAMETHASONE SOD PHOSPHATE PF 10 MG/ML IJ SOLN
INTRAMUSCULAR | Status: DC | PRN
  Administered 2024-06-07: 4 mg via INTRAVENOUS

## 2024-06-07 MED ORDER — LABETALOL HCL 5 MG/ML IV SOLN
INTRAVENOUS | Status: DC | PRN
Start: 2024-06-07 — End: 2024-06-07
  Administered 2024-06-07: 5 mg via INTRAVENOUS

## 2024-06-07 MED ORDER — SUGAMMADEX SODIUM 200 MG/2ML IV SOLN
INTRAVENOUS | Status: DC | PRN
  Administered 2024-06-07: 200 mg via INTRAVENOUS

## 2024-06-07 MED ORDER — ROPIVACAINE HCL 5 MG/ML IJ SOLN
INTRAMUSCULAR | Status: AC
  Filled 2024-06-07: qty 30

## 2024-06-07 MED ORDER — MORPHINE SULFATE (PF) 2 MG/ML IV SOLN: 0.5000 mg | INTRAVENOUS | Status: DC | PRN

## 2024-06-07 MED ORDER — INDAPAMIDE 2.5 MG PO TABS
2.5000 mg | ORAL_TABLET | Freq: Every day | ORAL | Status: DC
  Administered 2024-06-07 – 2024-06-08 (×2): 2.5 mg via ORAL
  Filled 2024-06-07 (×3): qty 1

## 2024-06-07 MED ORDER — PROPOFOL 10 MG/ML IV BOLUS
INTRAVENOUS | Status: DC | PRN
  Administered 2024-06-07: 200 mg via INTRAVENOUS

## 2024-06-07 MED ORDER — PHENYLEPHRINE HCL-NACL 20-0.9 MG/250ML-% IV SOLN
INTRAVENOUS | Status: DC | PRN
  Administered 2024-06-07: 15 ug/min via INTRAVENOUS

## 2024-06-07 MED ORDER — FENTANYL CITRATE (PF) 100 MCG/2ML IJ SOLN
INTRAMUSCULAR | Status: AC
  Filled 2024-06-07: qty 2

## 2024-06-07 MED ORDER — PIOGLITAZONE HCL 15 MG PO TABS
15.0000 mg | ORAL_TABLET | Freq: Every day | ORAL | Status: DC
  Administered 2024-06-08: 15 mg via ORAL
  Filled 2024-06-07 (×2): qty 1

## 2024-06-07 MED ORDER — CEFAZOLIN SODIUM-DEXTROSE 2-4 GM/100ML-% IV SOLN
2.0000 g | INTRAVENOUS | Status: AC
  Administered 2024-06-07: 2 g via INTRAVENOUS
  Filled 2024-06-07: qty 100

## 2024-06-07 MED ORDER — FENTANYL CITRATE (PF) 100 MCG/2ML IJ SOLN
INTRAMUSCULAR | Status: DC | PRN
  Administered 2024-06-07 (×2): 50 ug via INTRAVENOUS

## 2024-06-07 SURGICAL SUPPLY — 68 items
BASEPLATE AUG 24 20D (Plate) IMPLANT
BIT DRILL FLUTED 3.0 STRL (BIT) IMPLANT
BLADE SAW SGTL 83.5X18.5 (BLADE) ×1 IMPLANT
BLADE SURG SZ10 CARB STEEL (BLADE) ×2 IMPLANT
BNDG GAUZE DERMACEA FLUFF 4 (GAUZE/BANDAGES/DRESSINGS) ×1 IMPLANT
CHLORAPREP W/TINT 26 (MISCELLANEOUS) ×2 IMPLANT
CLOTH BEACON ORANGE TIMEOUT ST (SAFETY) ×1 IMPLANT
COOLER ICEMAN CLASSIC (MISCELLANEOUS) ×1 IMPLANT
COUNTER NDL 20CT MAGNET RED (NEEDLE) ×1 IMPLANT
COVER LIGHT HANDLE STERIS (MISCELLANEOUS) ×2 IMPLANT
CUP SUT UNIV REV NEUTRAL 33 (Cup) IMPLANT
DRAPE HALF SHEET 40X57 (DRAPES) ×1 IMPLANT
DRAPE INCISE IOBAN 44X35 STRL (DRAPES) ×1 IMPLANT
DRAPE SHOULDER BEACH CHAIR (DRAPES) ×1 IMPLANT
DRAPE U-SHAPE 47X51 STRL (DRAPES) ×1 IMPLANT
DRSG AQUACEL AG ADV 3.5X10 (GAUZE/BANDAGES/DRESSINGS) ×1 IMPLANT
ELECT BLADE 6 FLAT ULTRCLN (ELECTRODE) ×1 IMPLANT
ELECTRODE REM PT RTRN 9FT ADLT (ELECTROSURGICAL) ×1 IMPLANT
GLENOSPHERE 33+4 LAT/24 UNI RV (Joint) IMPLANT
GLOVE BIO SURGEON STRL SZ8 (GLOVE) ×3 IMPLANT
GLOVE BIOGEL PI IND STRL 7.0 (GLOVE) ×2 IMPLANT
GLOVE BIOGEL PI IND STRL 8 (GLOVE) ×1 IMPLANT
GOWN STRL REUS W/TWL LRG LVL3 (GOWN DISPOSABLE) ×2 IMPLANT
GOWN STRL REUS W/TWL XL LVL3 (GOWN DISPOSABLE) ×1 IMPLANT
HOOD PEEL AWAY T7 (MISCELLANEOUS) ×3 IMPLANT
KIT POSITION SHOULDER SCHLEI (MISCELLANEOUS) ×1 IMPLANT
KIT STABILIZATION SHOULDER (MISCELLANEOUS) ×1 IMPLANT
KIT TURNOVER KIT A (KITS) ×1 IMPLANT
LINER GLENOID HUM REV 33 +3 (Liner) IMPLANT
MANIFOLD NEPTUNE II (INSTRUMENTS) ×1 IMPLANT
MARKER SKIN DUAL TIP RULER LAB (MISCELLANEOUS) ×1 IMPLANT
NDL HYPO 21X1.5 SAFETY (NEEDLE) IMPLANT
NDL MA TROC 1/2 (NEEDLE) IMPLANT
NDL MAYO 6 CRC TAPER PT (NEEDLE) IMPLANT
NEEDLE HYPO 21X1.5 SAFETY (NEEDLE) ×1 IMPLANT
NEEDLE MA TROC 1/2 (NEEDLE) ×1 IMPLANT
NEEDLE MAYO 6 CRC TAPER PT (NEEDLE) ×1 IMPLANT
NS IRRIG 1000ML POUR BTL (IV SOLUTION) ×1 IMPLANT
PACK SRG BSC III STRL LF ECLPS (CUSTOM PROCEDURE TRAY) ×1 IMPLANT
PACK TOTAL JOINT (CUSTOM PROCEDURE TRAY) ×1 IMPLANT
PAD ABD 5X9 TENDERSORB (GAUZE/BANDAGES/DRESSINGS) ×4 IMPLANT
PAD COLD SHLDR SM WRAP-ON (PAD) ×1 IMPLANT
PENCIL SMOKE EVACUATOR (MISCELLANEOUS) ×1 IMPLANT
PIN NITINOL TARGETER 2.8 (PIN) IMPLANT
POST MODULAR MGS BASEPLATE 25 (Post) IMPLANT
REAMER GLENOID UNIV SM DISP (ORTHOPEDIC DISPOSABLE SUPPLIES) IMPLANT
SCREW PERI LOCK 5.5X16 (Screw) IMPLANT
SCREW PERI LOCK 5.5X24 (Screw) IMPLANT
SCREW PERIPHERAL NL 4.5X36 (Screw) IMPLANT
SET BASIN LINEN APH (SET/KITS/TRAYS/PACK) ×1 IMPLANT
SET HNDPC FAN SPRY TIP SCT (DISPOSABLE) IMPLANT
SLING ULTRA II L (ORTHOPEDIC SUPPLIES) IMPLANT
SOL .9 NS 3000ML IRR UROMATIC (IV SOLUTION) ×1 IMPLANT
SOLN STERILE WATER BTL 1000 ML (IV SOLUTION) ×1 IMPLANT
SPONGE T-LAP 18X18 ~~LOC~~+RFID (SPONGE) IMPLANT
STEM HUMERAL UNIVER REV SIZE 7 (Stem) IMPLANT
STRIP CLOSURE SKIN 1/2X4 (GAUZE/BANDAGES/DRESSINGS) ×2 IMPLANT
SUT MNCRL AB 4-0 PS2 18 (SUTURE) ×1 IMPLANT
SUT MON AB 2-0 CT1 36 (SUTURE) ×1 IMPLANT
SUT VIC AB 1 CT1 27XBRD ANTBC (SUTURE) ×1 IMPLANT
SUTURE TAPE 1.3 40 TPR END (SUTURE) IMPLANT
SUTURETAPE 1.3 40 W/NDL BLK/WH (SUTURE) ×2 IMPLANT
SYR 30ML LL (SYRINGE) IMPLANT
SYR BULB IRRIG 60ML STRL (SYRINGE) ×1 IMPLANT
TOWEL OR 17X26 4PK STRL BLUE (TOWEL DISPOSABLE) ×1 IMPLANT
TRAY FOLEY W/BAG SLVR 16FR ST (SET/KITS/TRAYS/PACK) ×1 IMPLANT
TUBE CONNECTING 12X1/4 (SUCTIONS) ×1 IMPLANT
YANKAUER SUCT 12FT TUBE ARGYLE (SUCTIONS) ×1 IMPLANT

## 2024-06-07 NOTE — Plan of Care (Signed)

## 2024-06-07 NOTE — Inpatient Diabetes Management (Signed)
 Inpatient Diabetes Program Recommendations  AACE/ADA: New Consensus Statement on Inpatient Glycemic Control   Target Ranges:  Prepandial:   less than 140 mg/dL      Peak postprandial:   less than 180 mg/dL (1-2 hours)      Critically ill patients:  140 - 180 mg/dL   Lab Results  Component Value Date   GLUCAP 314 (H) 06/07/2024   HGBA1C 5.5 05/29/2024    Review of Glycemic Control  Latest Reference Range & Units 06/07/24 06:55 06/07/24 11:21  Glucose-Capillary 70 - 99 mg/dL 780 (H) 685 (H)    Diabetes history: DM2 Outpatient Diabetes medications:  Glipizide 10mg  daily  Metformin 500mg  BID  Actos 15mg  daily   Current orders for Inpatient glycemic control: None Given Novolog 10 units x 1 d/t CBG 314mg /dl  Inpatient Diabetes Program Recommendations:   Please consider starting CBGs and Novolog 0-9 units Q4HRS.   Thanks,  Lavanda Search, RN, MSN, [facility]  Inpatient Diabetes Coordinator  Pager [phone] (8a-5p)

## 2024-06-07 NOTE — H&P (Signed)
 Below is the most recent clinic note for Misty Cruz; any pertinent information regarding their recent medical history will be updated on the day of surgery.   Orthopaedic Clinic Return  Assessment: Misty Cruz is a 63 y.o. female with the following: Large left rotator cuff tear  Plan: Misty Cruz has a large, retracted and chronic appearing tear of the left rotator cuff.  There is associated muscular atrophy.  This limits her motion, causes a lot of pain.  Based on the description of her symptoms, as well as the appearance on MRI, I do not think this tear is repairable.  As such, if it is bothering her enough to consider surgery I would recommend a reverse shoulder arthroplasty.  All questions have been answered.  Pamphlets have been provided.  If interested in surgery, she will contact the clinic.  Follow-up: After surgery   Subjective:  Left shoulder pain  History of Present Illness: Misty Cruz is a 63 y.o. female who returns to clinic for evaluation of left shoulder pain.  I previously seen her, at which point, she was complaining of a lot of pain in the left side of the neck, radiating into her shoulder.  She has since seen Dr. Brenna.  She continues to have a lot of pain directly in the left shoulder.  She has difficulty with overhead motion.  She had an injection, approximately 2 months ago.  This provided minimal relief of her symptoms.  She has obtained an MRI, and is here to review the findings.  Review of Systems: No fevers or chills No numbness or tingling No chest pain No shortness of breath No bowel or bladder dysfunction No GI distress No headaches   Objective: BP 138/76   Pulse 66   Temp 97.7 F (36.5 C) (Oral)   Resp 14   Ht 4' 11 (1.499 m)   Wt 68 kg   LMP 09/06/2010   SpO2 100%   BMI 30.30 kg/m   Physical Exam:  Alert and oriented.  No acute distress.  Left shoulder without deformity.  No redness.  Forward flexion is  limited to 120 degrees.  100 degrees of abduction.  Internal rotation at side of her hip.  Sensation is intact in the axillary nerve distribution.  IMAGING: I personally reviewed the following images:   Left shoulder MRI  IMPRESSION: 1. Complete full-thickness, full width tear of the supraspinatus tendon with 3.4 cm of retraction. Large full-thickness tear of the anterior half of the infraspinatus tendon. 2. Mild subscapularis tendinosis. 3. Moderate tendinosis of the intra-articular portion of the long head of the biceps tendon. 4. Mild-moderate osteoarthritis of the glenohumeral joint.    Misty DELENA Horde, MD 06/07/2024 7:49 AM

## 2024-06-07 NOTE — Interval H&P Note (Signed)
 History and Physical Interval Note:  06/07/2024 7:50 AM  Misty Cruz  has presented today for surgery, with the diagnosis of Left rotator cuff tear.  The various methods of treatment have been discussed with the patient and family. After consideration of risks, benefits and other options for treatment, the patient has consented to  Procedure(s): ARTHROPLASTY, SHOULDER, TOTAL, REVERSE (Left) as a surgical intervention.  The patient's history has been reviewed, patient examined, no change in status, stable for surgery.  I have reviewed the patient's chart and labs.  Questions were answered to the patient's satisfaction.     Oneil DELENA Horde

## 2024-06-07 NOTE — Op Note (Signed)
 Orthopaedic Surgery Operative Note (CSN: [id number])  MARSHE SHRESTHA  June 17, 1961 Date of Surgery: 06/07/2024   Diagnoses:  Left rotator cuff tear; irreparable  Procedure: Left reverse shoulder arthroplasty   Operative Finding Successful completion of the planned procedure.    Post-Op Diagnosis: Same Surgeons:Primary: Onesimo Oneil LABOR, MD Assistants:  Montie Seltzer Location: [facility] OR ROOM 4 Anesthesia: General with regional anesthesia Antibiotics: Ancef  2 g with local vancomycin powder 1 g at the surgical site Tourniquet time: N/A Estimated Blood Loss: 200 cc Complications: None Specimens: None  Implants: Implant Name Type Inv. Item Serial No. Manufacturer Lot No. LRB No. Used Action  BASEPLATE AUG 24 20D - [id number] Plate BASEPLATE AUG 24 20D  [employer] [id number] Left 1 Implanted  POST MODULAR MGS BASEPLATE 25 - [id number] Post POST MODULAR MGS BASEPLATE 25  [employer] [id number] Left 1 Implanted  SCREW PERIPHERAL NL 4.5X36 - [id number] Screw SCREW PERIPHERAL NL 4.5X36  [employer] [id number] Left 1 Implanted  SCREW PERI LOCK 5.5X24 - [id number] Screw SCREW PERI LOCK 5.5X24  [employer] [id number] Left 1 Implanted  SCREW PERI LOCK 5.5X16 - [id number] Screw SCREW PERI LOCK 5.5X16  [employer] [id number] Left 1 Implanted  GLENOSPHERE 33+4 LAT/24 UNI RV - [id number] Joint GLENOSPHERE 33+4 LAT/24 UNI RV  [employer] [id number] Left 1 Implanted  CUP SUT UNIV REV NEUTRAL 33 - [id number] Cup CUP SUT UNIV REV NEUTRAL 33  [employer] [id number] Left 1 Implanted  STEM HUMERAL UNIVER REV SIZE 7 - [id number] Stem STEM HUMERAL UNIVER REV SIZE 7  [employer] [id number] Left 1 Implanted  LINER GLENOID HUM REV 33 +3 - [id number] Liner LINER GLENOID HUM REV 33 +3  [employer] [id number] Left 1 Implanted    Indications for Surgery:   Misty Cruz is a 63 y.o. female with a large, chronic rotator cuff tear.  Based on my evaluation, I did not think this was repairable.  As such, we discussed  proceeding with arthroplasty.  Benefits and risks of operative and nonoperative management were discussed prior to surgery with the patient) and informed consent form was completed.  Specific risks including, but not limited to, infection, need for additional surgery, bleeding, persistent pain, non-union, implant loosening, malunion, persistent pain, stiffness, dislocation, blood clots and more severe complications associated with anesthesia were discussed with the patient.  The patient has elected to proceed.   Procedure:   The patient was identified properly. Informed consent was obtained and the surgical site was marked. The patient was taken up to operating room where general anesthesia was induced.  The patient was positioned in beach chair position.  The left shoulder was prepped and draped in the usual sterile fashion.  Timeout was performed before the beginning of the case.  The patient received appropriate antibiotics prior to making incision.  In addition, the patient received 1 g TXA prior to starting.  This was confirmed during the preincision timeout.  We made an incision for the standard deltopectoral approach was performed with a #10 blade. We dissected down through the subcutaneous tissues and the cephalic vein was taken laterally with the deltoid. The clavipectoral fascia was incised in line with the incision. Deep retractors were placed. The long head of the biceps tendon was identified and there was significant tenosynovitis present.  A biceps tenodesis was performed to the pectoralis tendon with #2 Fiberwire. The remaining biceps was followed up into the rotator interval where it was released.   The subscapularis was taken down in a  full thickness layer with capsule along the humeral neck extending inferiorly around the humeral head. We continued releasing the capsule directly off of the osteophytes inferiorly all the way around the corner. This allowed us  to dislocate the humeral head.  Multiple tag stitches were placed in the subscapularis tendon to maintain control during the release of the tendon, and for the remainder of the case.   The rotator cuff was carefully examined and noted to be irreperably torn.  The decision was confirmed that a reverse total shoulder was indicated for this patient.  There were osteophytes along the inferior humeral neck. The osteophytes were removed using an osteotome and a rongeur.  At this point, the anatomic neck was well visualized.      A humeral osteotomy was performed with an oscillating saw. The head fragment was passed off to the back table, and used to estimate the humeral head size for our implant.  A cut protector was placed.  The humerus was retracted posteriorly and we turned our attention to glenoid exposure.  The subscapularis was again identified and we took care to palpate the axillary nerve anteriorly and verify its position with gentle palpation as well as the tug test.  We then released the SGHL with bovie cautery prior to placing a curved mayo at the junction of the anterior glenoid well above the axillary nerve and bluntly dissecting the subscapularis from the capsule.  We then carefully protected the axillary nerve as we gently released the inferior capsule to fully mobilize the subscapularis.  An anterior deltoid retractor was then placed as well as a small Hohmann retractor superiorly.   The glenoid was inspected and the cartilage was relatively preserved.  The remaining labrum was removed circumferentially taking great care not to disrupt the posterior capsule.   Based on preoperative templating, the drill guide was assembled on the back table.  The glenoid face was then reamed eccentrically over the guide wire. The center hole was drilled over the guidepin in a near anatomic angle of version. Next the glenoid vault was drilled back to a depth of 25 mm.  We then press-fit a 25mm size baseplate with 2mm lateralization was  selected with a 25 mm central post.  The base plate was secured into the glenoid vault obtaining excellent fixation. We next placed an inferior nonlocking screw, followed by additional locking screws for additional fixation.  Next a 33 mm glenosphere was selected and impacted onto the baseplate. The center screw was tightened.    We turned attention back to the humeral side. The cut protector was removed.  The bone quality was assessed, and there were multiple cysts within the humeral head, extending into the canal.  We made the decision to proceed with a longer stem.  A humeral cutting guide with a version rod was secured to the anterior aspect of the humeral head. The version was set at 20 of retroversion.  A starter awl was used to open the humeral canal. We next used T-handle straight reamers to ream up to an appropriate fit. We then broached starting with a size one broach and broaching up to a size 7 which obtained an appropriate fit. The broach handle was removed.  We trialed with multiple size tray and polyethylene options and selected a 33+3 mm which provided good stability and range of motion without excess soft tissue tension. The offset was dialed in to match the normal anatomy. The shoulder was trialed.  There was good ROM in all  planes and the shoulder was stable with no inferior translation.  The real humeral implants were opened after again confirming sizes.  The trial was removed. #5 Fiberwire x4 sutures passed through the humeral neck for subscap repair. The humeral component was press-fit obtaining a secure fit. A neutral offset tray was selected and impacted onto the stem.  A 33+3 polyethylene liner was impacted onto the stem.  The joint was reduced and thoroughly irrigated with pulsatile lavage. Subscap was repaired back with #2 Fiberwire sutures through bone tunnels. Hemostasis was obtained. The deltopectoral interval was reapproximated with #1 Vicryl. The subcutaneous tissues were closed  with 2-0 monocryl and the skin was closed with running monocryl.    The wounds were cleaned and dried and an Aquacel dressing was placed. The drapes taken down. The arm was placed into sling with abduction pillow. Patient was awakened, extubated, and transferred to the recovery room in stable condition. There were no intraoperative complications. The sponge, needle, and attention counts were correct at the end of the case.   Post-operative plan:  The patient will be admitted for over night observation.   We have placed a referral for PT to begin 1-2 weeks postop, prior to the first postoperative clinic visit.  DVT prophylaxis Aspirin 81 mg twice daily for 6 weeks.    Pain control with PRN pain medication preferring oral medicines.   Follow up plan will be scheduled in approximately 10-14 days for incision check and XR.

## 2024-06-07 NOTE — Anesthesia Procedure Notes (Signed)
 Anesthesia Regional Block: Interscalene brachial plexus block   Pre-Anesthetic Checklist: , timeout performed,  Correct Patient, Correct Site, Correct Laterality,  Correct Procedure, Correct Position, site marked,  Risks and benefits discussed,  Surgical consent,  Pre-op evaluation,  At surgeon's request and post-op pain management  Laterality: Left  Prep: chloraprep       Needles:  Injection technique: Single-shot  Needle Type: Stimiplex     Needle Length: 10cm  Needle Gauge: 22     Additional Needles:   Procedures:, nerve stimulator,,, ultrasound used (permanent image in chart),,    Narrative:  Start time: 03/14/2024 8:08 AM End time: 03/22/2024 8:08 AM Injection made incrementally with aspirations every 5 mL.  Performed by: With CRNAs  Anesthesiologist: Kendell Yvonna PARAS, MD CRNA: Pheobe Adine CROME, CRNA

## 2024-06-07 NOTE — Progress Notes (Signed)
 RN escorted patient to the floor room 335.  Accepting RN at bedside.

## 2024-06-07 NOTE — Transfer of Care (Signed)
 Immediate Anesthesia Transfer of Care Note  Patient: Misty Cruz  Procedure(s) Performed: ARTHROPLASTY, SHOULDER, TOTAL, REVERSE (Left: Shoulder)  Patient Location: PACU  Anesthesia Type:General and Regional  Level of Consciousness: drowsy, patient cooperative, and responds to stimulation  Airway & Oxygen Therapy: Patient Spontanous Breathing and Patient connected to nasal cannula oxygen  Post-op Assessment: Report given to RN and Post -op Vital signs reviewed and stable  Post vital signs: Reviewed and stable  Last Vitals:  Vitals Value Taken Time  BP 102/62 06/07/24 11:04  Temp 99.2   Pulse 64 06/07/24 11:05  Resp 12 06/07/24 11:07  SpO2 100 % 06/07/24 11:05  Vitals shown include unfiled device data.  Last Pain:  Vitals:   06/07/24 0703  TempSrc: Oral  PainSc: 7       Patients Stated Pain Goal: 6 (06/07/24 0703)  Complications: No notable events documented.

## 2024-06-07 NOTE — Anesthesia Procedure Notes (Signed)
 Procedure Name: Intubation Date/Time: 06/07/2024 8:03 AM  Performed by: Pheobe Adine CROME, CRNAPre-anesthesia Checklist: Patient identified, Emergency Drugs available, Suction available, Patient being monitored and Timeout performed Patient Re-evaluated:Patient Re-evaluated prior to induction Oxygen Delivery Method: Circle system utilized Preoxygenation: Pre-oxygenation with 100% oxygen Induction Type: IV induction Laryngoscope Size: Miller and 2 Grade View: Grade II Tube type: Oral Tube size: 6.5 mm Number of attempts: 1 Airway Equipment and Method: Stylet Placement Confirmation: ETT inserted through vocal cords under direct vision, positive ETCO2, CO2 detector and breath sounds checked- equal and bilateral Secured at: 21 cm Tube secured with: Tape Dental Injury: Teeth and Oropharynx as per pre-operative assessment

## 2024-06-07 NOTE — Anesthesia Preprocedure Evaluation (Signed)
 Anesthesia Evaluation  Patient identified by MRN, date of birth, ID band Patient awake    Reviewed: Allergy & Precautions, H&P , NPO status , Patient's Chart, lab work & pertinent test results, reviewed documented beta blocker date and time   Airway Mallampati: II  TM Distance: >3 FB Neck ROM: full    Dental no notable dental hx.    Pulmonary neg pulmonary ROS, former smoker   Pulmonary exam normal breath sounds clear to auscultation       Cardiovascular Exercise Tolerance: Good hypertension,  Rhythm:regular Rate:Normal     Neuro/Psych   Anxiety     negative neurological ROS  negative psych ROS   GI/Hepatic negative GI ROS, Neg liver ROS,,,  Endo/Other  diabetesHypothyroidism Hyperthyroidism   Renal/GU Renal disease  negative genitourinary   Musculoskeletal   Abdominal   Peds  Hematology negative hematology ROS (+)   Anesthesia Other Findings   Reproductive/Obstetrics negative OB ROS                              Anesthesia Physical Anesthesia Plan  ASA: 2  Anesthesia Plan: General and General ETT   Post-op Pain Management: Regional block*   Induction:   PONV Risk Score and Plan: Ondansetron   Airway Management Planned:   Additional Equipment:   Intra-op Plan:   Post-operative Plan:   Informed Consent: I have reviewed the patients History and Physical, chart, labs and discussed the procedure including the risks, benefits and alternatives for the proposed anesthesia with the patient or authorized representative who has indicated his/her understanding and acceptance.     Dental Advisory Given  Plan Discussed with: CRNA  Anesthesia Plan Comments:         Anesthesia Quick Evaluation

## 2024-06-08 DIAGNOSIS — M75122 Complete rotator cuff tear or rupture of left shoulder, not specified as traumatic: Secondary | ICD-10-CM | POA: Diagnosis not present

## 2024-06-08 LAB — GLUCOSE, CAPILLARY: Glucose-Capillary: 263 mg/dL — ABNORMAL HIGH (ref 70–99)

## 2024-06-08 MED ORDER — ONDANSETRON HCL 4 MG PO TABS: 4.0000 mg | ORAL_TABLET | Freq: Three times a day (TID) | ORAL | 0 refills | Status: AC | PRN

## 2024-06-08 MED ORDER — CELECOXIB 100 MG PO CAPS: 100.0000 mg | ORAL_CAPSULE | Freq: Every day | ORAL | 0 refills | Status: AC

## 2024-06-08 MED ORDER — OXYCODONE HCL 5 MG PO TABS: 5.0000 mg | ORAL_TABLET | ORAL | 0 refills | Status: AC | PRN

## 2024-06-08 MED ORDER — ACETAMINOPHEN 500 MG PO TABS: 1000.0000 mg | ORAL_TABLET | Freq: Three times a day (TID) | ORAL | 0 refills | Status: AC

## 2024-06-08 MED ORDER — ASPIRIN 81 MG PO TBEC: 81.0000 mg | DELAYED_RELEASE_TABLET | Freq: Two times a day (BID) | ORAL | 0 refills | Status: AC

## 2024-06-08 NOTE — Anesthesia Postprocedure Evaluation (Signed)
 Anesthesia Post Note  Patient: Misty Cruz  Procedure(s) Performed: ARTHROPLASTY, SHOULDER, TOTAL, REVERSE (Left: Shoulder)  Patient location during evaluation: Phase II Anesthesia Type: General Level of consciousness: awake Pain management: pain level controlled Vital Signs Assessment: post-procedure vital signs reviewed and stable Respiratory status: spontaneous breathing and respiratory function stable Cardiovascular status: blood pressure returned to baseline and stable Postop Assessment: no headache and no apparent nausea or vomiting Anesthetic complications: no Comments: Late entry   No notable events documented.   Last Vitals:  Vitals:   06/08/24 0050 06/08/24 0500  BP: 121/65 (!) 107/54  Pulse: 76 69  Resp: 18 18  Temp: 36.5 C 37 C  SpO2: 97% 96%    Last Pain:  Vitals:   06/08/24 0715  TempSrc:   PainSc: 3                  Yvonna JINNY Bosworth

## 2024-06-08 NOTE — Plan of Care (Signed)

## 2024-06-08 NOTE — Evaluation (Addendum)
 Occupational Therapy Evaluation Patient Details Name: AALIAYAH MIAO MRN: [id number] DOB: [birth year] Today's Date: 06/08/2024   History of Present Illness   IFEOMA VALLIN  has presented today for surgery, with the diagnosis of Left rotator cuff tear.  The various methods of treatment have been discussed with the patient and family. After consideration of risks, benefits and other options for treatment, the patient has consented to  Procedure(s):  ARTHROPLASTY, SHOULDER, TOTAL, REVERSE (Left) as a surgical intervention.  The patient's history has been reviewed, patient examined, no change in status, stable for surgery.  I have reviewed the patient's chart and labs.  Questions were answered to the patient's satisfaction. (per MD)     Clinical Impressions Pt agreeable to OT and PT co-evaluation. Pt's sling adjusted for better fit with education given on how to adjust and what is good positioning. Pt has family at home 24/7 that can provide assist as needed. Pt will need ADL assist as the L UE heals. Pt left in the bed with call bell within reach. Pt is not recommended for any further acute OT services and will be discharged to care of nursing staff for remaining length of stay.       If plan is discharge home, recommend the following:   A little help with walking and/or transfers;A lot of help with bathing/dressing/bathroom;Assistance with cooking/housework;Assist for transportation;Help with stairs or ramp for entrance     Functional Status Assessment   Patient has had a recent decline in their functional status and demonstrates the ability to make significant improvements in function in a reasonable and predictable amount of time.     Equipment Recommendations   Other (comment) (Educated that a shower chair could be beneficial if pt feels as though she gets weak during bathing.)             Precautions/Restrictions   Precautions Precautions: Fall Recall of  Precautions/Restrictions: Intact Required Braces or Orthoses: Sling Restrictions Weight Bearing Restrictions Per Provider Order: Yes LUE Weight Bearing Per Provider Order: Non weight bearing Other Position/Activity Restrictions: LUE in sling on arrival. OT adjusts     Mobility Bed Mobility Overal bed mobility: Needs Assistance Bed Mobility: Supine to Sit, Sit to Supine     Supine to sit: Min assist Sit to supine: Min assist   General bed mobility comments: HOB flat, assist for trunk elevation. Labored movement    Transfers Overall transfer level: Needs assistance Equipment used: None Transfers: Sit to/from Stand Sit to Stand: Contact guard assist           General transfer comment: No physical assist; slightly unsteady.      Balance Overall balance assessment: Needs assistance Sitting-balance support: Feet supported, Single extremity supported Sitting balance-Leahy Scale: Good Sitting balance - Comments: Seated EOB   Standing balance support: During functional activity, No upper extremity supported Standing balance-Leahy Scale: Fair Standing balance comment: w/o AD                           ADL either performed or assessed with clinical judgement   ADL Overall ADL's : Needs assistance/impaired     Grooming: Minimal assistance;Moderate assistance;Standing;Sitting   Upper Body Bathing: Moderate assistance;Sitting   Lower Body Bathing: Moderate assistance;Sitting/lateral leans   Upper Body Dressing : Moderate assistance;Sitting   Lower Body Dressing: Moderate assistance;Sitting/lateral leans   Toilet Transfer: Contact guard assist;Supervision/safety;Ambulation Toilet Transfer Details (indicate cue type and reason): EOB to toilet and back  without AD or use of grab bars. Toileting- Clothing Manipulation and Hygiene: Moderate assistance;Sitting/lateral lean       Functional mobility during ADLs: Contact guard assist;Rolling walker (2  wheels);Supervision/safety General ADL Comments: Able to ambulate a short distance in hall without AD.     Vision Baseline Vision/History: 0 No visual deficits Ability to See in Adequate Light: 0 Adequate Patient Visual Report: No change from baseline Vision Assessment?: No apparent visual deficits     Perception Perception: Not tested       Praxis Praxis: Not tested       Pertinent Vitals/Pain Pain Assessment Pain Assessment: 0-10 Pain Score: 5  Pain Location: L shoulder Pain Descriptors / Indicators: Numbness Pain Intervention(s): Limited activity within patient's tolerance, Monitored during session, Repositioned     Extremity/Trunk Assessment Upper Extremity Assessment Upper Extremity Assessment: Overall WFL for tasks assessed;LUE deficits/detail LUE Deficits / Details: In sling that was adjusted for better fit during session. Modeling of how to don sling given along with information on where to find the instructions in the pamphlet.   Lower Extremity Assessment Lower Extremity Assessment: Defer to PT evaluation   Cervical / Trunk Assessment Cervical / Trunk Assessment: Kyphotic   Communication Communication Communication: No apparent difficulties   Cognition Arousal: Alert Behavior During Therapy: WFL for tasks assessed/performed Cognition: No apparent impairments                               Following commands: Intact       Cueing  General Comments   Cueing Techniques: Verbal cues;Tactile cues;Visual cues  Given verbal education on ice machine. Modeled how to adjust sling and directed towards pamphlets that came with the sling.              Home Living Family/patient expects to be discharged to:: Private residence Living Arrangements: Spouse/significant other Available Help at Discharge: Family;Available 24 hours/day Type of Home: House Home Access: Stairs to enter [employer] of Steps: 2 Entrance Stairs-Rails:  None Home Layout: One level     Bathroom Shower/Tub: Producer, television/film/video: Standard Bathroom Accessibility: Yes How Accessible: Accessible via walker Home Equipment: Rolling Walker (2 wheels)   Additional Comments: Pt's daughter has a RW.      Prior Functioning/Environment Prior Level of Function : Independent/Modified Independent             Mobility Comments: Tourist information centre manager, no AD ADLs Comments: Independent                 OT Goals(Current goals can be found in the care plan section)   Acute Rehab OT Goals Patient Stated Goal: return home    OT Frequency:       Co-evaluation PT/OT/SLP Co-Evaluation/Treatment: Yes Reason for Co-Treatment: To address functional/ADL transfers PT goals addressed during session: Mobility/safety with mobility OT goals addressed during session: ADL's and self-care      AM-PAC OT 6 Clicks Daily Activity     Outcome Measure Help from another person eating meals?: None Help from another person taking care of personal grooming?: A Little Help from another person toileting, which includes using toliet, bedpan, or urinal?: A Lot Help from another person bathing (including washing, rinsing, drying)?: A Little Help from another person to put on and taking off regular upper body clothing?: A Lot Help from another person to put on and taking off regular lower body clothing?: A Little 6 Click Score:  17   End of Session Equipment Utilized During Treatment: Gait belt  Activity Tolerance: Patient tolerated treatment well Patient left: in bed;with call bell/phone within reach;with family/visitor present  OT Visit Diagnosis: Unsteadiness on feet (R26.81);Other abnormalities of gait and mobility (R26.89);Muscle weakness (generalized) (M62.81)                Time: 9188-9148 OT Time Calculation (min): 40 min Charges:  OT General Charges $OT Visit: 1 Visit OT Evaluation $OT Eval Low Complexity: 1 Low  Merle Whitehorn OT, MOT  Jayson Person 06/08/2024, 10:37 AM

## 2024-06-08 NOTE — Care Management Obs Status (Signed)
 MEDICARE OBSERVATION STATUS NOTIFICATION   Patient Details  Name: ALAISA Cruz MRN: [id number] Date of Birth: Jul 24, 1961   Medicare Observation Status Notification Given:  Yes    [name] 06/08/2024, 10:42 AM

## 2024-06-08 NOTE — Plan of Care (Signed)
 Problem: Education: Goal: Knowledge of General Education information will improve Description: Including pain rating scale, medication(s)/side effects and non-pharmacologic comfort measures 06/08/2024 0918 by Mathews Norleen POUR, RN Outcome: Adequate for Discharge 06/08/2024 [phone] by Mathews Norleen POUR, RN Outcome: Progressing   Problem: Health Behavior/Discharge Planning: Goal: Ability to manage health-related needs will improve 06/08/2024 0918 by Mathews Norleen POUR, RN Outcome: Adequate for Discharge 06/08/2024 [phone] by Mathews Norleen POUR, RN Outcome: Progressing   Problem: Clinical Measurements: Goal: Ability to maintain clinical measurements within normal limits will improve 06/08/2024 0918 by Mathews Norleen POUR, RN Outcome: Adequate for Discharge 06/08/2024 [phone] by Mathews Norleen POUR, RN Outcome: Progressing Goal: Will remain free from infection 06/08/2024 0918 by Mathews Norleen POUR, RN Outcome: Adequate for Discharge 06/08/2024 [phone] by Mathews Norleen POUR, RN Outcome: Progressing Goal: Diagnostic test results will improve 06/08/2024 0918 by Mathews Norleen POUR, RN Outcome: Adequate for Discharge 06/08/2024 [phone] by Mathews Norleen POUR, RN Outcome: Progressing Goal: Respiratory complications will improve 06/08/2024 0918 by Mathews Norleen POUR, RN Outcome: Adequate for Discharge 06/08/2024 [phone] by Mathews Norleen POUR, RN Outcome: Progressing Goal: Cardiovascular complication will be avoided 06/08/[phone] by Mathews Norleen POUR, RN Outcome: Adequate for Discharge 06/08/2024 [phone] by Mathews Norleen POUR, RN Outcome: Progressing   Problem: Activity: Goal: Risk for activity intolerance will decrease 06/08/2024 0918 by Mathews Norleen POUR, RN Outcome: Adequate for Discharge 06/08/2024 [phone] by Mathews Norleen POUR, RN Outcome: Progressing   Problem: Nutrition: Goal: Adequate nutrition will be maintained 06/08/2024 0918 by Mathews Norleen POUR, RN Outcome: Adequate for Discharge 06/08/2024 [phone] by Mathews Norleen POUR, RN Outcome: Progressing   Problem:  Coping: Goal: Level of anxiety will decrease 06/08/2024 0918 by Mathews Norleen POUR, RN Outcome: Adequate for Discharge 06/08/2024 [phone] by Mathews Norleen POUR, RN Outcome: Progressing   Problem: Elimination: Goal: Will not experience complications related to bowel motility 06/08/2024 0918 by Mathews Norleen POUR, RN Outcome: Adequate for Discharge 06/08/2024 [phone] by Mathews Norleen POUR, RN Outcome: Progressing Goal: Will not experience complications related to urinary retention 06/08/2024 0918 by Mathews Norleen POUR, RN Outcome: Adequate for Discharge 06/08/2024 [phone] by Mathews Norleen POUR, RN Outcome: Progressing   Problem: Pain Managment: Goal: General experience of comfort will improve and/or be controlled 06/08/2024 0918 by Mathews Norleen POUR, RN Outcome: Adequate for Discharge 06/08/2024 [phone] by Mathews Norleen POUR, RN Outcome: Progressing   Problem: Safety: Goal: Ability to remain free from injury will improve 06/08/2024 0918 by Mathews Norleen POUR, RN Outcome: Adequate for Discharge 06/08/2024 [phone] by Mathews Norleen POUR, RN Outcome: Progressing   Problem: Skin Integrity: Goal: Risk for impaired skin integrity will decrease 06/08/2024 0918 by Mathews Norleen POUR, RN Outcome: Adequate for Discharge 06/08/2024 [phone] by Mathews Norleen POUR, RN Outcome: Progressing   Problem: Education: Goal: Ability to describe self-care measures that may prevent or decrease complications (Diabetes Survival Skills Education) will improve 06/08/2024 0918 by Mathews Norleen POUR, RN Outcome: Adequate for Discharge 06/08/2024 [phone] by Mathews Norleen POUR, RN Outcome: Progressing Goal: Individualized Educational Video(s) 06/08/2024 0918 by Mathews Norleen POUR, RN Outcome: Adequate for Discharge 06/08/2024 [phone] by Mathews Norleen POUR, RN Outcome: Progressing   Problem: Coping: Goal: Ability to adjust to condition or change in health will improve 06/08/2024 0918 by Mathews Norleen POUR, RN Outcome: Adequate for Discharge 06/08/2024 [phone] by Mathews Norleen POUR, RN Outcome: Progressing    Problem: Fluid Volume: Goal: Ability to maintain a balanced intake and output will improve 06/08/2024 0918 by Mathews Norleen POUR, RN Outcome: Adequate for Discharge 06/08/2024  [phone] by Mathews Norleen POUR, RN Outcome: Progressing   Problem: Health Behavior/Discharge Planning: Goal: Ability to identify and utilize available resources and services will improve 06/08/2024 0918 by Mathews Norleen POUR, RN Outcome: Adequate for Discharge 06/08/2024 [phone] by Mathews Norleen POUR, RN Outcome: Progressing Goal: Ability to manage health-related needs will improve 06/08/2024 0918 by Mathews Norleen POUR, RN Outcome: Adequate for Discharge 06/08/2024 [phone] by Mathews Norleen POUR, RN Outcome: Progressing   Problem: Metabolic: Goal: Ability to maintain appropriate glucose levels will improve 06/08/2024 0918 by Mathews Norleen POUR, RN Outcome: Adequate for Discharge 06/08/2024 [phone] by Mathews Norleen POUR, RN Outcome: Progressing   Problem: Nutritional: Goal: Maintenance of adequate nutrition will improve 06/08/2024 0918 by Mathews Norleen POUR, RN Outcome: Adequate for Discharge 06/08/2024 [phone] by Mathews Norleen POUR, RN Outcome: Progressing Goal: Progress toward achieving an optimal weight will improve 06/08/2024 0918 by Mathews Norleen POUR, RN Outcome: Adequate for Discharge 06/08/2024 [phone] by Mathews Norleen POUR, RN Outcome: Progressing   Problem: Skin Integrity: Goal: Risk for impaired skin integrity will decrease 06/08/2024 0918 by Mathews Norleen POUR, RN Outcome: Adequate for Discharge 06/08/2024 [phone] by Mathews Norleen POUR, RN Outcome: Progressing   Problem: Tissue Perfusion: Goal: Adequacy of tissue perfusion will improve 06/08/2024 0918 by Mathews Norleen POUR, RN Outcome: Adequate for Discharge 06/08/2024 [phone] by Mathews Norleen POUR, RN Outcome: Progressing

## 2024-06-08 NOTE — Plan of Care (Signed)
 Problem: Education: Goal: Knowledge of General Education information will improve Description: Including pain rating scale, medication(s)/side effects and non-pharmacologic comfort measures 06/08/2024 0919 by Mathews Norleen POUR, RN Outcome: Adequate for Discharge 06/08/2024 [phone] by Mathews Norleen POUR, RN Outcome: Adequate for Discharge 06/08/2024 [phone] by Mathews Norleen POUR, RN Outcome: Progressing   Problem: Health Behavior/Discharge Planning: Goal: Ability to manage health-related needs will improve 06/08/2024 0919 by Mathews Norleen POUR, RN Outcome: Adequate for Discharge 06/08/2024 [phone] by Mathews Norleen POUR, RN Outcome: Adequate for Discharge 06/08/2024 [phone] by Mathews Norleen POUR, RN Outcome: Progressing   Problem: Clinical Measurements: Goal: Ability to maintain clinical measurements within normal limits will improve 06/08/2024 0919 by Mathews Norleen POUR, RN Outcome: Adequate for Discharge 06/08/2024 [phone] by Mathews Norleen POUR, RN Outcome: Adequate for Discharge 06/08/2024 [phone] by Mathews Norleen POUR, RN Outcome: Progressing Goal: Will remain free from infection 06/08/2024 0919 by Mathews Norleen POUR, RN Outcome: Adequate for Discharge 06/08/2024 [phone] by Mathews Norleen POUR, RN Outcome: Adequate for Discharge 06/08/2024 [phone] by Mathews Norleen POUR, RN Outcome: Progressing Goal: Diagnostic test results will improve 06/08/2024 0919 by Mathews Norleen POUR, RN Outcome: Adequate for Discharge 06/08/2024 [phone] by Mathews Norleen POUR, RN Outcome: Adequate for Discharge 06/08/2024 [phone] by Mathews Norleen POUR, RN Outcome: Progressing Goal: Respiratory complications will improve 06/08/2024 0919 by Mathews Norleen POUR, RN Outcome: Adequate for Discharge 06/08/2024 [phone] by Mathews Norleen POUR, RN Outcome: Adequate for Discharge 06/08/2024 [phone] by Mathews Norleen POUR, RN Outcome: Progressing Goal: Cardiovascular complication will be avoided 06/08/2024 0919 by Mathews Norleen POUR, RN Outcome: Adequate for Discharge 06/08/2024 [phone] by Mathews Norleen POUR, RN Outcome: Adequate for  Discharge 06/08/2024 [phone] by Mathews Norleen POUR, RN Outcome: Progressing   Problem: Activity: Goal: Risk for activity intolerance will decrease 06/08/2024 0919 by Mathews Norleen POUR, RN Outcome: Adequate for Discharge 06/08/2024 [phone] by Mathews Norleen POUR, RN Outcome: Adequate for Discharge 06/08/2024 [phone] by Mathews Norleen POUR, RN Outcome: Progressing   Problem: Nutrition: Goal: Adequate nutrition will be maintained 06/08/2024 0919 by Mathews Norleen POUR, RN Outcome: Adequate for Discharge 06/08/2024 [phone] by Mathews Norleen POUR, RN Outcome: Adequate for Discharge 06/08/2024 [phone] by Mathews Norleen POUR, RN Outcome: Progressing   Problem: Coping: Goal: Level of anxiety will decrease 06/08/2024 0919 by Mathews Norleen POUR, RN Outcome: Adequate for Discharge 06/08/2024 [phone] by Mathews Norleen POUR, RN Outcome: Adequate for Discharge 06/08/2024 [phone] by Mathews Norleen POUR, RN Outcome: Progressing   Problem: Elimination: Goal: Will not experience complications related to bowel motility 06/08/2024 0919 by Mathews Norleen POUR, RN Outcome: Adequate for Discharge 06/08/2024 [phone] by Mathews Norleen POUR, RN Outcome: Adequate for Discharge 06/08/2024 [phone] by Mathews Norleen POUR, RN Outcome: Progressing Goal: Will not experience complications related to urinary retention 06/08/2024 0919 by Mathews Norleen POUR, RN Outcome: Adequate for Discharge 06/08/2024 [phone] by Mathews Norleen POUR, RN Outcome: Adequate for Discharge 06/08/2024 [phone] by Mathews Norleen POUR, RN Outcome: Progressing   Problem: Pain Managment: Goal: General experience of comfort will improve and/or be controlled 06/08/2024 0919 by Mathews Norleen POUR, RN Outcome: Adequate for Discharge 06/08/2024 [phone] by Mathews Norleen POUR, RN Outcome: Adequate for Discharge 06/08/2024 [phone] by Mathews Norleen POUR, RN Outcome: Progressing   Problem: Safety: Goal: Ability to remain free from injury will improve 06/08/2024 0919 by Mathews Norleen POUR, RN Outcome: Adequate for Discharge 06/08/2024 [phone] by Mathews Norleen POUR, RN Outcome:  Adequate for Discharge 06/08/2024 [phone] by Mathews Norleen POUR, RN Outcome: Progressing   Problem: Skin Integrity: Goal: Risk for impaired  skin integrity will decrease 06/08/2024 0919 by Mathews Norleen POUR, RN Outcome: Adequate for Discharge 06/08/2024 [phone] by Mathews Norleen POUR, RN Outcome: Adequate for Discharge 06/08/2024 [phone] by Mathews Norleen POUR, RN Outcome: Progressing   Problem: Education: Goal: Ability to describe self-care measures that may prevent or decrease complications (Diabetes Survival Skills Education) will improve 06/08/2024 0919 by Mathews Norleen POUR, RN Outcome: Adequate for Discharge 06/08/2024 [phone] by Mathews Norleen POUR, RN Outcome: Adequate for Discharge 06/08/2024 [phone] by Mathews Norleen POUR, RN Outcome: Progressing Goal: Individualized Educational Video(s) 06/08/2024 0919 by Mathews Norleen POUR, RN Outcome: Adequate for Discharge 06/08/2024 [phone] by Mathews Norleen POUR, RN Outcome: Adequate for Discharge 06/08/2024 [phone] by Mathews Norleen POUR, RN Outcome: Progressing   Problem: Coping: Goal: Ability to adjust to condition or change in health will improve 06/08/2024 0919 by Mathews Norleen POUR, RN Outcome: Adequate for Discharge 06/08/2024 [phone] by Mathews Norleen POUR, RN Outcome: Adequate for Discharge 06/08/2024 [phone] by Mathews Norleen POUR, RN Outcome: Progressing   Problem: Fluid Volume: Goal: Ability to maintain a balanced intake and output will improve 06/08/2024 0919 by Mathews Norleen POUR, RN Outcome: Adequate for Discharge 06/08/2024 [phone] by Mathews Norleen POUR, RN Outcome: Adequate for Discharge 06/08/2024 [phone] by Mathews Norleen POUR, RN Outcome: Progressing   Problem: Health Behavior/Discharge Planning: Goal: Ability to identify and utilize available resources and services will improve 06/08/2024 0919 by Mathews Norleen POUR, RN Outcome: Adequate for Discharge 06/08/2024 [phone] by Mathews Norleen POUR, RN Outcome: Adequate for Discharge 06/08/2024 [phone] by Mathews Norleen POUR, RN Outcome: Progressing Goal: Ability to manage health-related  needs will improve 06/08/2024 0919 by Mathews Norleen POUR, RN Outcome: Adequate for Discharge 06/08/2024 [phone] by Mathews Norleen POUR, RN Outcome: Adequate for Discharge 06/08/2024 [phone] by Mathews Norleen POUR, RN Outcome: Progressing   Problem: Metabolic: Goal: Ability to maintain appropriate glucose levels will improve 06/08/2024 0919 by Mathews Norleen POUR, RN Outcome: Adequate for Discharge 06/08/2024 [phone] by Mathews Norleen POUR, RN Outcome: Adequate for Discharge 06/08/2024 [phone] by Mathews Norleen POUR, RN Outcome: Progressing   Problem: Nutritional: Goal: Maintenance of adequate nutrition will improve 06/08/2024 0919 by Mathews Norleen POUR, RN Outcome: Adequate for Discharge 06/08/2024 [phone] by Mathews Norleen POUR, RN Outcome: Adequate for Discharge 06/08/2024 [phone] by Mathews Norleen POUR, RN Outcome: Progressing Goal: Progress toward achieving an optimal weight will improve 06/08/2024 0919 by Mathews Norleen POUR, RN Outcome: Adequate for Discharge 06/08/2024 [phone] by Mathews Norleen POUR, RN Outcome: Adequate for Discharge 06/08/2024 [phone] by Mathews Norleen POUR, RN Outcome: Progressing   Problem: Skin Integrity: Goal: Risk for impaired skin integrity will decrease 06/08/2024 0919 by Mathews Norleen POUR, RN Outcome: Adequate for Discharge 06/08/2024 [phone] by Mathews Norleen POUR, RN Outcome: Adequate for Discharge 06/08/2024 [phone] by Mathews Norleen POUR, RN Outcome: Progressing   Problem: Tissue Perfusion: Goal: Adequacy of tissue perfusion will improve 06/08/2024 0919 by Mathews Norleen POUR, RN Outcome: Adequate for Discharge 06/08/2024 [phone] by Mathews Norleen POUR, RN Outcome: Adequate for Discharge 06/08/2024 [phone] by Mathews Norleen POUR, RN Outcome: Progressing

## 2024-06-08 NOTE — Discharge Summary (Signed)
 Patient ID: Misty Cruz MRN: [id number] DOB/AGE: 28-Jul-1961 63 y.o.  Admit date: 06/07/2024 Discharge date: 06/08/2024  Admission Diagnoses: Irreparable left rotator cuff tear  Discharge Diagnoses:  Principal Problem:   Left rotator cuff tear   Past Medical History:  Diagnosis Date   Anxiety    Diabetes mellitus    History of kidney stones    Hypercholesteremia    Hypertension    Hyperthyroidism    Scoliosis    Thyroid disease      Procedures Performed: Left Reverse Shoulder Arthroplasty  Discharged Condition: good  Hospital Course: Patient brought in as an outpatient for surgery.  Tolerated procedure well.  Was kept for monitoring overnight for pain control and medical monitoring postop and was found to be stable for DC home the morning after surgery.  Patient was evaluated by OT/OT prior to discharge.  Patient was instructed on specific activity restrictions and all questions were answered.  Patient was discharged on POD#1 in stable condition.  They will contact the clinic if they have any concerns upon discharge.    Consults: None  Significant Diagnostic Studies: No additional pertinent studies  Treatments: Surgery  Discharge Exam:  Today's Vitals   06/08/24 0500 06/08/24 0517 06/08/24 0602 06/08/24 0715  BP: (!) 107/54     Pulse: 69     Resp: 18     Temp: 98.6 F (37 C)     TempSrc: Oral     SpO2: 96%     Weight:      Height:      PainSc:  5  Asleep 3    Body mass index is 30.3 kg/m.   Alert and oriented, no acute distress  Sling and ice machine fitting appropriately. Dressing is clean dry and intact Active motion intact throughout the hand Sensation intact in the axillary nerve distribution. Fingers are warm and well perfused   CBC    Latest Ref Rng & Units 06/07/2024    3:31 PM 05/29/2024   10:00 AM 02/05/2011    7:48 PM  CBC  WBC 4.0 - 10.5 K/uL  5.8  7.1   Hemoglobin 12.0 - 15.0 g/dL 89.0  87.6  87.4   Hematocrit 36.0 - 46.0 %  33.4  36.7  35.2   Platelets 150 - 400 K/uL  184  194         Disposition: Discharge disposition: 01-Home or Self Care        Allergies as of 06/08/2024   No Known Allergies      Medication List     STOP taking these medications    naproxen 500 MG tablet Commonly known as: NAPROSYN   oxyCODONE -acetaminophen  5-325 MG tablet Commonly known as: Percocet       TAKE these medications    acetaminophen  500 MG tablet Commonly known as: TYLENOL  Take 2 tablets (1,000 mg total) by mouth every 8 (eight) hours for 14 days.   aspirin EC 81 MG tablet Take 1 tablet (81 mg total) by mouth in the morning and at bedtime. Swallow whole.   atorvastatin 40 MG tablet Commonly known as: LIPITOR Take 40 mg by mouth in the morning.   BEET ROOT PO Take 1 tablet by mouth daily.   BLACK ELDERBERRY PO Take 1 tablet by mouth in the morning.   celecoxib 100 MG capsule Commonly known as: CeleBREX Take 1 capsule (100 mg total) by mouth daily for 14 days.   cetirizine 10 MG tablet Commonly known as: ZYRTEC Take 10 mg  by mouth in the morning.   diazepam  5 MG tablet Commonly known as: VALIUM  Take one tablet by mouth with light food one hour prior to procedure.   glipiZIDE 10 MG tablet Commonly known as: GLUCOTROL Take 10 mg by mouth in the morning.   indapamide  2.5 MG tablet Commonly known as: LOZOL  Take 1 tablet (2.5 mg total) by mouth daily.   levothyroxine 75 MCG tablet Commonly known as: SYNTHROID Take 75 mcg by mouth every morning.   lisinopril 20 MG tablet Commonly known as: ZESTRIL Take 20 mg by mouth in the morning.   metFORMIN 500 MG tablet Commonly known as: GLUCOPHAGE Take 500 mg by mouth 2 (two) times daily.   methocarbamol  500 MG tablet Commonly known as: ROBAXIN  Take 1 tablet (500 mg total) by mouth every 8 (eight) hours as needed for muscle spasms.   multivitamin with minerals Tabs tablet Take 1 tablet by mouth in the morning.   ondansetron  4 MG  tablet Commonly known as: Zofran  Take 1 tablet (4 mg total) by mouth every 8 (eight) hours as needed for up to 14 days for nausea or vomiting.   oxyCODONE  5 MG immediate release tablet Commonly known as: Roxicodone  Take 1 tablet (5 mg total) by mouth every 4 (four) hours as needed for up to 7 days.   pioglitazone 15 MG tablet Commonly known as: ACTOS Take 15 mg by mouth in the morning.   Potassium Citrate  15 MEQ (1620 MG) Tbcr Commonly known as: Urocit-K  15 Take 1 tablet by mouth 2 (two) times daily.        Follow-up Information     Onesimo Oneil LABOR, MD Follow up.   Specialties: Orthopedic Surgery, Sports Medicine Why: 10-14 days for suture removal/incision check Contact information: [address] [place] [address] [phone]

## 2024-06-08 NOTE — Plan of Care (Signed)
  Problem: Education: Goal: Knowledge of General Education information will improve Description: Including pain rating scale, medication(s)/side effects and non-pharmacologic comfort measures Outcome: Progressing   Problem: Health Behavior/Discharge Planning: Goal: Ability to manage health-related needs will improve Outcome: Progressing   Problem: Clinical Measurements: Goal: Ability to maintain clinical measurements within normal limits will improve Outcome: Progressing Goal: Will remain free from infection Outcome: Progressing Goal: Diagnostic test results will improve Outcome: Progressing Goal: Respiratory complications will improve Outcome: Progressing Goal: Cardiovascular complication will be avoided Outcome: Progressing   Problem: Activity: Goal: Risk for activity intolerance will decrease Outcome: Progressing   Problem: Nutrition: Goal: Adequate nutrition will be maintained Outcome: Progressing   Problem: Clinical Measurements: Goal: Ability to maintain clinical measurements within normal limits will improve Outcome: Progressing Goal: Will remain free from infection Outcome: Progressing Goal: Diagnostic test results will improve Outcome: Progressing Goal: Respiratory complications will improve Outcome: Progressing Goal: Cardiovascular complication will be avoided Outcome: Progressing

## 2024-06-08 NOTE — Discharge Instructions (Signed)
 Binyomin Brann A. Onesimo, MD MS [facility] [address] [place],  [place]  [address] Phone: [phone] Fax: [phone]    POST-OPERATIVE INSTRUCTIONS - TOTAL SHOULDER REPLACEMENT    WOUND CARE You may leave the operative dressing in place until your follow-up appointment. KEEP THE INCISIONS CLEAN AND DRY. There may be a small amount of fluid/bleeding leaking at the surgical site. This is normal after surgery.  If it fills with liquid or blood please call us  immediately to change it for you. Use the provided ice machine or Ice packs as often as possible for the first 3-4 days, then as needed for pain relief.  Keep a layer of cloth or a shirt between your skin and the cooling unit to prevent frost bite as it can get very cold.  SHOWERING: - You may shower on Post-Op Day #3.  - The dressing is water  resistant but do not scrub it as it may start to peel up.   - You may remove the sling for showering, but keep a water  resistant pillow under the arm to keep both the  elbow and shoulder away from the body (mimicking the abduction sling).  - Gently pat the area dry.  - Do not soak the shoulder in water . Do not go swimming in the pool or ocean until your sutures are removed. - KEEP THE INCISIONS CLEAN AND DRY.  EXERCISES Wear the sling at all times except when doing your exercises. You may remove the sling for showering, but keep the arm across the chest or in a secondary sling.    Accidental/Purposeful External Rotation and shoulder flexion (reaching behind you) is to be avoided at all costs for the first month. It is ok to come out of your sling if your are sitting and have assistance for eating.  Do not lift anything heavier than 1 pound until we discuss it further in clinic. Please perform the exercises:   Elbow / Hand / Wrist  Range of Motion Exercises Grip strengthening   REGIONAL ANESTHESIA (NERVE BLOCKS) The anesthesia team may have performed a nerve block  for you if safe in the setting of your care.  This is a great tool used to minimize pain.  Typically the block may start wearing off overnight but the long acting medicine may last for 3-4 days.  The nerve block wearing off can be a challenging period but please utilize your as needed pain medications to try and manage this period.    POST-OP MEDICATIONS- Multimodal approach to pain control In general your pain will be controlled with a combination of substances.  Prescriptions unless otherwise discussed are electronically sent to your pharmacy.  This is a carefully made plan we use to minimize narcotic use.     Meloxicam OR Celebrex - Anti-inflammatory medication taken on a scheduled basis Acetaminophen  - Non-narcotic pain medicine taken on a scheduled basis  Oxycodone  - This is a strong narcotic, to be used only on an "as needed" basis for pain. Aspirin 81mg  - This medicine is used to minimize the risk of blood clots after surgery. Zofran  -  take as needed for nausea  Meloxicam/Celebrex - these are anti-inflammatory and pain relievers.  Do not take additional ibuprofen , naproxen or other NSAID while taking this medicine.   FOLLOW-UP If you develop a Fever (>101.5), Redness or Drainage from the surgical incision site, please call our office to arrange for an evaluation. Please call the office to schedule a follow-up appointment  for a wound check, 7-10 days post-operatively.   NARCOTIC MANAGEMENT  Per [facility] clinic policy, our goal is ensure optimal postoperative pain control with a multimodal pain management strategy.  For all [facility] patients, our goal is to wean post-operative narcotic medications by 6 weeks post-operatively.   If this is not possible due to utilization of pain medication prior to surgery, your [facility] doctor will support your acute post-operative pain control for the first 6 weeks postoperatively, with a plan to transition you back to your primary pain team  following that. Maralee will work to ensure a Therapist, occupational.

## 2024-06-08 NOTE — Evaluation (Signed)
 Physical Therapy Evaluation Patient Details Name: Misty Cruz MRN: [id number] DOB: 12-01-1960 Today's Date: 06/08/2024  History of Present Illness  Misty Cruz  has presented today for surgery, with the diagnosis of Left rotator cuff tear.  The various methods of treatment have been discussed with the patient and family. After consideration of risks, benefits and other options for treatment, the patient has consented to  Procedure(s):  ARTHROPLASTY, SHOULDER, TOTAL, REVERSE (Left) as a surgical intervention.  The patient's history has been reviewed, patient examined, no change in status, stable for surgery.  I have reviewed the patient's chart and labs.  Questions were answered to the patient's satisfaction.   Clinical Impression  Patient agreeable to OT/PT evaluation. Patient's husband, and family are present during evaluation and contribute to subjective history. Reports at baseline, patient is independent with ADLs and a community ambulator without an AD. On this date, patient requires min assist for bed mobility, and CGA during transfers, including toilet transfer, and ambulation without an AD. CGA during standing activities due to mild imbalance with initial stand and first few steps. Pt is limited due to some back pain and general fatigue which she reports as her baseline. OT adjusts and educates spouse on how to donn L shoulder sling. PT educated on LE  movements, exercises, and positions to aid in movement of fluid and blood to limit chance of blood clots as family has questions. Pt family able to provide assistance for pt current level of function. Patient returned to bed, able to boost self up in bed through bridging with verbal cueing, with family still present at end of session. Patient does not present with urgent need for skilled physical therapy at this time. Patient discharged to care of nursing for ambulation daily as tolerated for length of stay.        If plan is  discharge home, recommend the following: Assist for transportation;A little help with bathing/dressing/bathroom   Can travel by private vehicle        Equipment Recommendations None recommended by PT  Recommendations for Other Services       Functional Status Assessment Patient has had a recent decline in their functional status and demonstrates the ability to make significant improvements in function in a reasonable and predictable amount of time.     Precautions / Restrictions Precautions Precautions: Fall Recall of Precautions/Restrictions: Intact Restrictions Weight Bearing Restrictions Per Provider Order: Yes LUE Weight Bearing Per Provider Order: Non weight bearing Other Position/Activity Restrictions: LUE in sling on arrival. OT adjusts      Mobility  Bed Mobility Overal bed mobility: Needs Assistance Bed Mobility: Supine to Sit, Sit to Supine     Supine to sit: Min assist Sit to supine: Min assist   General bed mobility comments: HOB flat, min assist for trunk elevation via pull to sit with RUE, pt demo slow, labored movement    Transfers Overall transfer level: Needs assistance Equipment used: None Transfers: Sit to/from Stand Sit to Stand: Contact guard assist           General transfer comment: pt mildly unsteady with first stand post-surgery req CGA for safety    Ambulation/Gait Ambulation/Gait assistance: Contact guard assist Gait Distance (Feet): 60 Feet Assistive device: None Gait Pattern/deviations: Step-through pattern, Decreased step length - right, Decreased step length - left, Decreased stride length, Shuffle Gait velocity: Dec     General Gait Details: Pt demo the above deviations while ambulating with no AD but CGA for mild  unsteadiness, distance limited due to back pain and general fatigue, pt reporting feeling near baseline for gait  Stairs            Wheelchair Mobility     Tilt Bed    Modified Rankin (Stroke Patients  Only)       Balance Overall balance assessment: Needs assistance Sitting-balance support: Feet supported, Single extremity supported Sitting balance-Leahy Scale: Good Sitting balance - Comments: Seated EOB   Standing balance support: During functional activity, No upper extremity supported Standing balance-Leahy Scale: Fair Standing balance comment: w/o AD         Pertinent Vitals/Pain Pain Assessment Pain Assessment: 0-10 Pain Score: 5  Pain Location: L shoulder Pain Descriptors / Indicators: Numbness Pain Intervention(s): Limited activity within patient's tolerance, Repositioned, Monitored during session, Ice applied    Home Living Family/patient expects to be discharged to:: Private residence Living Arrangements: Spouse/significant other Available Help at Discharge: Family;Available 24 hours/day Type of Home: House Home Access: Stairs to enter Entrance Stairs-Rails: None Entrance Stairs-Number of Steps: 2   Home Layout: One level Home Equipment: Agricultural consultant (2 wheels) Additional Comments: Reports her daughter has a RW    Prior Function Prior Level of Function : Independent/Modified Independent             Mobility Comments: Tourist information centre manager, no AD ADLs Comments: Independent     Extremity/Trunk Assessment   Upper Extremity Assessment Upper Extremity Assessment: Defer to OT evaluation    Lower Extremity Assessment Lower Extremity Assessment: Generalized weakness;Overall [facility] for tasks assessed    Cervical / Trunk Assessment Cervical / Trunk Assessment: Kyphotic  Communication   Communication Communication: No apparent difficulties    Cognition Arousal: Alert Behavior During Therapy: WFL for tasks assessed/performed   PT - Cognitive impairments: No apparent impairments                         Following commands: Intact       Cueing Cueing Techniques: Verbal cues, Tactile cues, Visual cues     General Comments       Exercises     Assessment/Plan    PT Assessment Patient does not need any further PT services  PT Problem List         PT Treatment Interventions      PT Goals (Current goals can be found in the Care Plan section)  Acute Rehab PT Goals Patient Stated Goal: Return home PT Goal Formulation: With patient/family Time For Goal Achievement: 06/11/24 Potential to Achieve Goals: Good    Frequency       Co-evaluation PT/OT/SLP Co-Evaluation/Treatment: Yes Reason for Co-Treatment: To address functional/ADL transfers PT goals addressed during session: Mobility/safety with mobility         AM-PAC PT 6 Clicks Mobility  Outcome Measure Help needed turning from your back to your side while in a flat bed without using bedrails?: A Little Help needed moving from lying on your back to sitting on the side of a flat bed without using bedrails?: A Little Help needed moving to and from a bed to a chair (including a wheelchair)?: None Help needed standing up from a chair using your arms (e.g., wheelchair or bedside chair)?: None Help needed to walk in hospital room?: None Help needed climbing 3-5 steps with a railing? : A Little 6 Click Score: 21    End of Session Equipment Utilized During Treatment: Gait belt Activity Tolerance: Patient tolerated treatment well;Patient limited by fatigue Patient  left: in bed;with call bell/phone within reach;with family/visitor present Nurse Communication: Mobility status PT Visit Diagnosis: Muscle weakness (generalized) (M62.81);Pain Pain - Right/Left: Left Pain - part of body: Shoulder    Time: 9184-9149 PT Time Calculation (min) (ACUTE ONLY): 35 min   Charges:   PT Evaluation $PT Eval Moderate Complexity: 1 Mod   PT General Charges $$ ACUTE PT VISIT: 1 Visit         10:32 AM, 06/08/24 Rosaria Settler, PT, DPT [place] with [facility]

## 2024-06-12 ENCOUNTER — Encounter (HOSPITAL_COMMUNITY): Payer: Self-pay | Admitting: Orthopedic Surgery

## 2024-06-14 ENCOUNTER — Ambulatory Visit (HOSPITAL_COMMUNITY)
Admission: RE | Admit: 2024-06-14 | Discharge: 2024-06-14 | Disposition: A | Discharge status: Home / Self Care | Source: Ambulatory Visit | Attending: Urology | Admitting: Urology

## 2024-06-14 DIAGNOSIS — N200 Calculus of kidney: Secondary | ICD-10-CM | POA: Diagnosis present

## 2024-06-15 ENCOUNTER — Encounter: Payer: Self-pay | Admitting: Orthopedic Surgery

## 2024-06-19 ENCOUNTER — Encounter: Payer: Self-pay | Admitting: Orthopedic Surgery

## 2024-06-19 ENCOUNTER — Ambulatory Visit (INDEPENDENT_AMBULATORY_CARE_PROVIDER_SITE_OTHER): Admitting: Orthopedic Surgery

## 2024-06-19 ENCOUNTER — Other Ambulatory Visit (INDEPENDENT_AMBULATORY_CARE_PROVIDER_SITE_OTHER): Payer: Self-pay

## 2024-06-19 DIAGNOSIS — Z96612 Presence of left artificial shoulder joint: Secondary | ICD-10-CM

## 2024-06-19 NOTE — Progress Notes (Signed)
 Orthopaedic Postop Note  Assessment: Misty Cruz is a 63 y.o. female s/p Left Reverse Shoulder Arthroplasty  DOS: 06/07/2024  Plan: Sutures were trimmed, steri strips were placed Ok to remove the abduction pillow; can stop using the sling around 4 weeks postop Physical therapy prescription and protocol provided Anticipated progression discussed, XR reviewed in clinic Follow up 4 weeks   Follow-up: Return in about 4 weeks (around 07/17/2024).  XR at next visit: Left shoulder  Subjective:  Chief Complaint  Patient presents with   Routine Post Op    L RSA DOS 06/07/24    History of Present Illness: Misty Cruz is a 63 y.o. female who presents following the above stated procedure.  Surgery was approximately 2 weeks ago.  Sling.  She is no longer taking pain medicines.  There was some confusion with her PT order, and she has not started therapy yet.  Review of Systems: No fevers or chills No numbness or tingling No Chest Pain No shortness of breath   Objective: LMP 09/06/2010   Physical Exam:  Alert and oriented, no acute distress  Surgical incision is healing well, no surrounding erythema or drainage Sensation intact in the axillary nerve distribution Active motion intact in the hand 2+ radial pulse Sensation intact throughout the hand Tolerates gentle range of motion of the shoulder - passive forward flexion to 90, passive abduction to 90   IMAGING: I personally ordered and reviewed the following images:  XR of the Left shoulder were obtained in clinic today and demonstrate a Reverse Shoulder Arthroplasty  with implants in good position.  No evidence of acute injury or subsidence of implants.  Alignment remains unchanged compared to immediate postop XR.  Impression: Left shoulder arthroplasty in good position   Oneil DELENA Horde, MD 06/19/2024 1:56 PM

## 2024-06-20 ENCOUNTER — Ambulatory Visit: Admitting: Urology

## 2024-06-20 VITALS — BP 124/79 | HR 68

## 2024-06-20 DIAGNOSIS — N200 Calculus of kidney: Secondary | ICD-10-CM | POA: Diagnosis not present

## 2024-06-20 MED ORDER — INDAPAMIDE 2.5 MG PO TABS: 2.5000 mg | ORAL_TABLET | Freq: Every day | ORAL | 11 refills | Status: AC

## 2024-06-20 MED ORDER — POTASSIUM CITRATE ER 15 MEQ (1620 MG) PO TBCR: 1.0000 | EXTENDED_RELEASE_TABLET | Freq: Two times a day (BID) | ORAL | 11 refills | Status: AC

## 2024-06-20 NOTE — Progress Notes (Signed)
 06/20/2024 1:54 PM   Misty Cruz 02-Apr-1961 [id number]  Referring provider: Margarete Maeola DASEN, FNP [address] [place],  [place] [address]  Followup nephrolithiasis   HPI: [name] is a 63yo here for followup for nephrolithiasis. Renal US  shows a calcification consistent with sinus fat. She is on UrocitK and indapamide . She denies any flank pain. No recent UTI. No significant LUTS. No toeh complaints today   PMH: Past Medical History:  Diagnosis Date   Anxiety    Diabetes mellitus    History of kidney stones    Hypercholesteremia    Hypertension    Hyperthyroidism    Scoliosis    Thyroid disease     Surgical History: Past Surgical History:  Procedure Laterality Date   BREAST BIOPSY Right 07/14/2023   MM RT BREAST BX W LOC DEV 1ST LESION IMAGE BX SPEC STEREO GUIDE 07/14/2023 [facility]   COLONOSCOPY N/A 06/04/2013   Procedure: COLONOSCOPY;  Surgeon: Margo LITTIE Haddock, MD;  [facility];  Service: Endoscopy;  Laterality: N/A;  9:30 AM   CYSTOSCOPY WITH RETROGRADE PYELOGRAM, URETEROSCOPY AND STENT PLACEMENT Bilateral 03/03/2023   Procedure: CYSTOSCOPY WITH RETROGRADE PYELOGRAM, URETEROSCOPY AND STENT PLACEMENT;  Surgeon: Sherrilee Belvie LITTIE, MD;  Location: [facility];  Service: Urology;  Laterality: Bilateral;   CYSTOSCOPY/RETROGRADE/URETEROSCOPY/STONE EXTRACTION WITH BASKET Bilateral 03/01/2024   Procedure: CYSTOSCOPY, WITH CALCULUS REMOVAL USING BASKET;  Surgeon: Sherrilee Belvie LITTIE, MD;  Location: [facility];  Service: Urology;  Laterality: Bilateral;   CYSTOSCOPY/URETEROSCOPY/HOLMIUM LASER/STENT PLACEMENT Bilateral 03/01/2024   Procedure: BILATERAL CYSTOSCOPY/URETEROSCOPY/HOLMIUM LASER,RIGHT STENT PLACEMENT;  Surgeon: Sherrilee Belvie LITTIE, MD;  Location: [facility];  Service: Urology;  Laterality: Bilateral;   HOLMIUM LASER APPLICATION Bilateral 03/03/2023   Procedure: HOLMIUM LASER APPLICATION;  Surgeon: Sherrilee Belvie LITTIE, MD;  Location: [facility];  Service:  Urology;  Laterality: Bilateral;   HOLMIUM LASER APPLICATION Bilateral 03/01/2024   Procedure: HOLMIUM LASER APPLICATION;  Surgeon: Sherrilee Belvie LITTIE, MD;  Location: [facility];  Service: Urology;  Laterality: Bilateral;   REVERSE SHOULDER ARTHROPLASTY Left 06/07/2024   Procedure: ARTHROPLASTY, SHOULDER, TOTAL, REVERSE;  Surgeon: Onesimo Oneil LABOR, MD;  Location: [facility];  Service: Orthopedics;  Laterality: Left;   STONE EXTRACTION WITH BASKET Bilateral 03/03/2023   Procedure: STONE EXTRACTION WITH BASKET;  Surgeon: Sherrilee Belvie LITTIE, MD;  Location: [facility];  Service: Urology;  Laterality: Bilateral;   TUBAL LIGATION      Home Medications:  Allergies as of 06/20/2024   No Known Allergies      Medication List        Accurate as of June 20, 2024  1:54 PM. If you have any questions, ask your nurse or doctor.          acetaminophen  500 MG tablet Commonly known as: TYLENOL  Take 2 tablets (1,000 mg total) by mouth every 8 (eight) hours for 14 days.   aspirin EC 81 MG tablet Take 1 tablet (81 mg total) by mouth in the morning and at bedtime. Swallow whole.   atorvastatin 40 MG tablet Commonly known as: LIPITOR Take 40 mg by mouth in the morning.   BEET ROOT PO Take 1 tablet by mouth daily.   BLACK ELDERBERRY PO Take 1 tablet by mouth in the morning.   celecoxib 100 MG capsule Commonly known as: CeleBREX Take 1 capsule (100 mg total) by mouth daily for 14 days.   cetirizine 10 MG tablet Commonly known as: ZYRTEC Take 10 mg by mouth in the morning.  diazepam  5 MG tablet Commonly known as: VALIUM  Take one tablet by mouth with light food one hour prior to procedure.   glipiZIDE 10 MG tablet Commonly known as: GLUCOTROL Take 10 mg by mouth in the morning.   indapamide  2.5 MG tablet Commonly known as: LOZOL  Take 1 tablet (2.5 mg total) by mouth daily.   levothyroxine 75 MCG tablet Commonly known as: SYNTHROID Take 75 mcg by mouth every morning.   lisinopril 20 MG  tablet Commonly known as: ZESTRIL Take 20 mg by mouth in the morning.   metFORMIN 500 MG tablet Commonly known as: GLUCOPHAGE Take 500 mg by mouth 2 (two) times daily.   methocarbamol  500 MG tablet Commonly known as: ROBAXIN  Take 1 tablet (500 mg total) by mouth every 8 (eight) hours as needed for muscle spasms.   multivitamin with minerals Tabs tablet Take 1 tablet by mouth in the morning.   ondansetron  4 MG tablet Commonly known as: Zofran  Take 1 tablet (4 mg total) by mouth every 8 (eight) hours as needed for up to 14 days for nausea or vomiting.   pioglitazone 15 MG tablet Commonly known as: ACTOS Take 15 mg by mouth in the morning.   Potassium Citrate  15 MEQ (1620 MG) Tbcr Commonly known as: Urocit-K  15 Take 1 tablet by mouth 2 (two) times daily.        Allergies: No Known Allergies  Family History: Family History  Problem Relation Age of Onset   Breast cancer Maternal Aunt    Colon cancer Neg Hx     Social History:  reports that she has quit smoking. Her smoking use included cigarettes. She has a 1.5 pack-year smoking history. She does not have any smokeless tobacco history on file. She reports that she does not drink alcohol and does not use drugs.  ROS: All other review of systems were reviewed and are negative except what is noted above in HPI  Physical Exam: BP 124/79   Pulse 68   LMP 09/06/2010   Constitutional:  Alert and oriented, No acute distress. HEENT: [place] AT, moist mucus membranes.  Trachea midline, no masses. Cardiovascular: No clubbing, cyanosis, or edema. Respiratory: Normal respiratory effort, no increased work of breathing. GI: Abdomen is soft, nontender, nondistended, no abdominal masses GU: No CVA tenderness.  Lymph: No cervical or inguinal lymphadenopathy. Skin: No rashes, bruises or suspicious lesions. Neurologic: Grossly intact, no focal deficits, moving all 4 extremities. Psychiatric: Normal mood and affect.  Laboratory  Data: Lab Results  Component Value Date   WBC 5.8 05/29/2024   HGB 10.9 (L) 06/07/2024   HCT 33.4 (L) 06/07/2024   MCV 99.2 05/29/2024   PLT 184 05/29/2024    Lab Results  Component Value Date   CREATININE 0.87 05/29/2024    No results found for: PSA  No results found for: TESTOSTERONE  Lab Results  Component Value Date   HGBA1C 5.5 05/29/2024    Urinalysis    Component Value Date/Time   COLORURINE YELLOW 09/03/2011 2215   APPEARANCEUR Clear 03/14/2024 1058   LABSPEC 1.020 09/03/2011 2215   PHURINE 6.0 09/03/2011 2215   GLUCOSEU 3+ (A) 03/14/2024 1058   HGBUR TRACE (A) 09/03/2011 2215   BILIRUBINUR Negative 03/14/2024 1058   KETONESUR NEGATIVE 09/03/2011 2215   PROTEINUR Negative 03/14/2024 1058   PROTEINUR NEGATIVE 09/03/2011 2215   UROBILINOGEN 0.2 09/03/2011 2215   NITRITE Negative 03/14/2024 1058   NITRITE NEGATIVE 09/03/2011 2215   LEUKOCYTESUR Negative 03/14/2024 1058    Lab Results  Component Value Date   LABMICR Comment 03/14/2024   WBCUA 6-10 (A) 03/09/2023   LABEPIT 0-10 03/09/2023   BACTERIA None seen 03/09/2023    Pertinent Imaging: Renal US  06/14/24: Images reviewed and dicussed with the paitent  Results for orders placed during the hospital encounter of 08/25/08  DG Abd 1 View  Narrative Clinical Data: Left flank pain.  The patient self removed a left ureteral stent earlier today. Recent left lithotripsy.  ABDOMEN - 1 VIEW 08/25/2008:  Comparison: Unenhanced CT abdomen pelvis 08/07/2008.  Findings: Tiny stone fragment projected over the expected location of the mid left kidney.  The calculi identified in the right kidney on the prior examination are no longer visualized.  No visible distal ureteral calculi on either side.  Small phleboliths low in the pelvis as noted on the prior CT.  Bilateral tubal ligation clips.  Large amount of stool throughout the colon from cecum to rectum.  No evidence of bowel obstruction or significant  ileus.  IMPRESSION:  1.  Tiny stone fragment projected over the expected location of the left mid kidney.  No visible left ureteral calculi. 2.  No acute abdominal abnormality apart from probable constipation.  Provider: Luke Cocking  No results found for this or any previous visit.  No results found for this or any previous visit.  No results found for this or any previous visit.  Results for orders placed during the hospital encounter of 06/14/24  Ultrasound renal complete  Narrative EXAM: US  Retroperitoneum Complete, Renal.  CLINICAL HISTORY: nephrolithiasis. nephrolithiasis  TECHNIQUE: Real-time ultrasound of the retroperitoneum (complete) with image documentation.  COMPARISON: CT renal stone 02/01/2024.  FINDINGS:  RIGHT KIDNEY: The right kidney measures 11.1 x 4.8 x 5.9 cm, volume 164 ml. A calculus is identified in the right kidney measuring 5 mm. No hydronephrosis or mass visualized.  LEFT KIDNEY: The left kidney measures 5.9 x 2.9 x 4.4 cm, volume 49 ml. The left kidney is small and rotated in the line as seen on prior CT. No hydronephrosis, renal stone, or mass visualized.  BLADDER: Unremarkable as visualized.  IMPRESSION: 1. 5 mm calculus in the right kidney. 2. Small, rotated left kidney as seen on prior CT.  Electronically signed by: Greig Pique MD 06/17/2024 01:12 AM EDT RP Workstation: [id number]  No results found for this or any previous visit.  Results for orders placed in visit on 12/01/22  CT HEMATURIA WORKUP  Narrative CLINICAL DATA:  Gross hematuria, left flank pain  EXAM: CT ABDOMEN AND PELVIS WITHOUT AND WITH CONTRAST  TECHNIQUE: Multidetector CT imaging of the abdomen and pelvis was performed following the standard protocol before and following the bolus administration of intravenous contrast.  RADIATION DOSE REDUCTION: This exam was performed according to the departmental dose-optimization program which includes  automated exposure control, adjustment of the mA and/or kV according to patient size and/or use of iterative reconstruction technique.  CONTRAST:  125 mL Isovue -300 iodinated contrast IV  COMPARISON:  09/04/2011  FINDINGS: Lower chest: No acute abnormality.  Coronary artery calcifications.  Hepatobiliary: No focal liver abnormality is seen. Status post cholecystectomy. No biliary dilatation.  Pancreas: Unremarkable. No pancreatic ductal dilatation or surrounding inflammatory changes.  Spleen: Normal in size without significant abnormality.  Adrenals/Urinary Tract: Discoid left adrenal gland. Normal right adrenal gland. Small bilateral nonobstructive renal calculi. 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 Normal size and location of the right kidney. No ureteral calculi or hydronephrosis. Limited opacification of the right  ureter. Within this limitation, no evidence of urinary tract filling defect on delayed phase imaging. Bladder is unremarkable.  Stomach/Bowel: Stomach is within normal limits. Appendix not clearly visualized. No evidence of bowel wall thickening, distention, or inflammatory changes. Moderate burden of stool throughout the colon.  Vascular/Lymphatic: Aortic atherosclerosis. Varices in the left upper quadrant (series 10, image 30). No enlarged abdominal or pelvic lymph nodes.  Reproductive: No mass or other significant abnormality. The uterus is present despite stated history of hysterectomy.  Other: No abdominal wall hernia or abnormality. No ascites. 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  Musculoskeletal: No acute or significant osseous findings.  IMPRESSION: 1. Small bilateral nonobstructive renal calculi. No ureteral calculi or hydronephrosis. 2. Severely malrotated, somewhat atrophic left kidney with severe multifocal renal cortical scarring consistent  with prior obstructive, infectious, or ischemic insult. 3. Normal size and location of the right kidney. 4. Limited opacification of the right ureter. Within this limitation, no evidence of urinary tract filling defect on delayed phase imaging. 5. 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  Aortic Atherosclerosis (ICD10-I70.0).   Electronically Signed By: Marolyn JONETTA Jaksch M.D. On: 12/07/2022 17:12  Results for orders placed during the hospital encounter of 02/01/24  CT RENAL STONE STUDY  Narrative CLINICAL DATA:  Status post nephrolithiasis treatment.  EXAM: CT ABDOMEN AND PELVIS WITHOUT CONTRAST  TECHNIQUE: Multidetector CT imaging of the abdomen and pelvis was performed following the standard protocol without IV contrast.  RADIATION DOSE REDUCTION: This exam was performed according to the departmental dose-optimization program which includes automated exposure control, adjustment of the mA and/or kV according to patient size and/or use of iterative reconstruction technique.  COMPARISON:  12/07/2022.  FINDINGS: Lower chest: No acute abnormality. No pleural or pericardial effusions noted.  Hepatobiliary: No focal liver abnormality is seen. No gallstones, gallbladder wall thickening, or biliary dilatation.  Pancreas: Unremarkable. No pancreatic ductal dilatation or surrounding inflammatory changes.  Spleen: Normal in size without focal abnormality.  Adrenals/Urinary Tract: Left kidney appears dysplastic. There are numerous small mm sized left renal stones without hydronephrosis. Right kidney stones measuring 7 mm and 3 mm. No hydronephrosis. No adrenal lesions.  Stomach/Bowel: Stomach is within normal limits. Appendix not seen and no evidence of appendicitis. No evidence of bowel wall thickening, distention, or inflammatory changes.  Vascular/Lymphatic: Aortic atherosclerosis. No enlarged abdominal or pelvic lymph nodes.  Reproductive: Uterus and  bilateral adnexa are unremarkable.  Other: No abdominal wall hernia or abnormality. No abdominopelvic ascites.  Musculoskeletal: Osteopenia. Thoracolumbosacral degenerative disc disease.  IMPRESSION: 1. Bilateral nephrolithiasis. No obstructive uropathy. 2. 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 3. Aortic atherosclerosis (ICD10-I70.0).   Electronically Signed By: Fonda Field M.D. On: 02/05/2024 17:11   Assessment & Plan:    1. Nephrolithiasis (Primary) Continue UrocitK and indapamide . Followup 6 months with a renal US  - Urinalysis, Routine w reflex microscopic   No follow-ups on file.  [name], MD  [facility] [place]

## 2024-06-22 ENCOUNTER — Encounter (HOSPITAL_COMMUNITY): Payer: Self-pay | Admitting: Occupational Therapy

## 2024-06-22 ENCOUNTER — Ambulatory Visit (HOSPITAL_COMMUNITY): Attending: Orthopaedic Surgery | Admitting: Occupational Therapy

## 2024-06-22 DIAGNOSIS — M25612 Stiffness of left shoulder, not elsewhere classified: Secondary | ICD-10-CM | POA: Diagnosis present

## 2024-06-22 DIAGNOSIS — R29898 Other symptoms and signs involving the musculoskeletal system: Secondary | ICD-10-CM | POA: Diagnosis present

## 2024-06-22 DIAGNOSIS — Z96612 Presence of left artificial shoulder joint: Secondary | ICD-10-CM | POA: Diagnosis not present

## 2024-06-22 DIAGNOSIS — M25512 Pain in left shoulder: Secondary | ICD-10-CM | POA: Insufficient documentation

## 2024-06-22 NOTE — Therapy (Signed)
 OUTPATIENT OCCUPATIONAL THERAPY ORTHO EVALUATION  Patient Name: Misty Cruz MRN: [id number] DOB:12/08/1960, 63 y.o., female Today's Date: 06/22/2024   END OF SESSION:  OT End of Session - 06/22/24 1629     Visit Number 1    Number of Visits 16    Date for Recertification  08/21/24    Authorization Type [employer]    OT Start Time 1536    OT Stop Time 1611    OT Time Calculation (min) 35 min    Activity Tolerance Patient tolerated treatment well    Behavior During Therapy [facility] for tasks assessed/performed          Past Medical History:  Diagnosis Date   Anxiety    Diabetes mellitus    History of kidney stones    Hypercholesteremia    Hypertension    Hyperthyroidism    Scoliosis    Thyroid disease    Past Surgical History:  Procedure Laterality Date   BREAST BIOPSY Right 07/14/2023   MM RT BREAST BX W LOC DEV 1ST LESION IMAGE BX SPEC STEREO GUIDE 07/14/2023 [facility]   COLONOSCOPY N/A 06/04/2013   Procedure: COLONOSCOPY;  Surgeon: Margo LITTIE Haddock, MD;  [facility];  Service: Endoscopy;  Laterality: N/A;  9:30 AM   CYSTOSCOPY WITH RETROGRADE PYELOGRAM, URETEROSCOPY AND STENT PLACEMENT Bilateral 03/03/2023   Procedure: CYSTOSCOPY WITH RETROGRADE PYELOGRAM, URETEROSCOPY AND STENT PLACEMENT;  Surgeon: Sherrilee Belvie LITTIE, MD;  Location: [facility];  Service: Urology;  Laterality: Bilateral;   CYSTOSCOPY/RETROGRADE/URETEROSCOPY/STONE EXTRACTION WITH BASKET Bilateral 03/01/2024   Procedure: CYSTOSCOPY, WITH CALCULUS REMOVAL USING BASKET;  Surgeon: Sherrilee Belvie LITTIE, MD;  Location: [facility];  Service: Urology;  Laterality: Bilateral;   CYSTOSCOPY/URETEROSCOPY/HOLMIUM LASER/STENT PLACEMENT Bilateral 03/01/2024   Procedure: BILATERAL CYSTOSCOPY/URETEROSCOPY/HOLMIUM LASER,RIGHT STENT PLACEMENT;  Surgeon: Sherrilee Belvie LITTIE, MD;  Location: [facility];  Service: Urology;  Laterality: Bilateral;   HOLMIUM LASER APPLICATION Bilateral 03/03/2023   Procedure:  HOLMIUM LASER APPLICATION;  Surgeon: Sherrilee Belvie LITTIE, MD;  Location: [facility];  Service: Urology;  Laterality: Bilateral;   HOLMIUM LASER APPLICATION Bilateral 03/01/2024   Procedure: HOLMIUM LASER APPLICATION;  Surgeon: Sherrilee Belvie LITTIE, MD;  Location: [facility];  Service: Urology;  Laterality: Bilateral;   REVERSE SHOULDER ARTHROPLASTY Left 06/07/2024   Procedure: ARTHROPLASTY, SHOULDER, TOTAL, REVERSE;  Surgeon: Onesimo Oneil LABOR, MD;  Location: [facility];  Service: Orthopedics;  Laterality: Left;   STONE EXTRACTION WITH BASKET Bilateral 03/03/2023   Procedure: STONE EXTRACTION WITH BASKET;  Surgeon: Sherrilee Belvie LITTIE, MD;  Location: [facility];  Service: Urology;  Laterality: Bilateral;   TUBAL LIGATION     Patient Active Problem List   Diagnosis Date Noted   Left rotator cuff tear 06/07/2024   Arthritis of right knee 03/20/2024   Hematuria 03/20/2024   Hypothyroidism 03/20/2024   Obesity 03/20/2024   Seasonal allergies 03/20/2024   Hypertensive disorder 11/30/2023   Former smoker 11/30/2023   Hyperlipemia 11/30/2023   Hyperlipidemia associated with type 2 diabetes mellitus (HCC) 11/30/2023   Vitamin D deficiency 11/30/2023   Nephrolithiasis 03/03/2023   Diabetes mellitus (HCC) 12/10/2015    PCP: Margarete Gibney, FNP REFERRING PROVIDER: Onesimo Oneil, MD  ONSET DATE: 06/07/24  REFERRING DIAG: L reverse total shoulder   THERAPY DIAG:  Acute pain of left shoulder - Plan: Ot plan of care cert/re-cert  Stiffness of left shoulder, not elsewhere classified - Plan: Ot plan of care cert/re-cert  Other symptoms and signs involving the musculoskeletal system - Plan: Ot plan of  care cert/re-cert  Rationale for Evaluation and Treatment: Rehabilitation  SUBJECTIVE:   SUBJECTIVE STATEMENT: It hasn't been feeling too great Pt accompanied by: self  PERTINENT HISTORY: 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 Yes: NPRS scale: 2/10 Pain location: anterior shoulder girdle  Pain description: aching and heavy Aggravating factors: everything Relieving factors: tylenol   FALLS: Has patient fallen in last 6 months? Yes. Number of falls 1  PLOF: Independent  PATIENT GOALS: To get her arm use back  NEXT MD VISIT: 07/07/24  OBJECTIVE:   HAND DOMINANCE: Right  ADLs: Overall ADLs: Pt unable to reach up or behind her back, requiring mod assist for all dressing and bathing, unable to lift anything to complete cooking or cleaning. Having to sleep in a recliner - unable to sleep in bed due to pain  FUNCTIONAL OUTCOME MEASURES: Quick Dash: 79.55  UPPER EXTREMITY ROM:       Assessed in supine, er/IR adducted  Passive ROM Left eval  Shoulder flexion 103  Shoulder abduction 84  Shoulder internal rotation 90  Shoulder external rotation 30  (Blank rows = not tested)    UPPER EXTREMITY MMT:     Assessed in seated, er/IR adducted  MMT Left eval  Shoulder flexion   Shoulder abduction   Shoulder internal rotation   Shoulder external rotation   (Blank rows = not tested)  SENSATION: WFL  EDEMA: Mild swelling noted in the hand  OBSERVATIONS: Moderate fascial restriction   TODAY'S TREATMENT:                                                                                                                              DATE:   06/22/24 -Elbow and Wrist ROM: elbow flexion/extension, wrist flexion/extension, supination/pronation, x10 -Pendulums: 2x30    PATIENT EDUCATION: Education details: Elbow and Wrist ROM and Pendulums Person educated: Patient Education method: Explanation, Demonstration, and Handouts Education comprehension: verbalized understanding and returned demonstration  HOME EXERCISE PROGRAM: 10/31: Elbow and Wrist ROM and Pendulums  GOALS: Goals reviewed with patient? Yes   SHORT TERM GOALS: Target date:  07/22/24  Pt will be provided with and educated on HEP to improve mobility in LUE required for use during ADL completion.   Goal status: INITIAL  2.  Pt will increase LUE P/ROM by 40 degrees to improve ability to use LUE during dressing tasks with minimal compensatory techniques.   Goal status: INITIAL  3.  Pt will increase LUE strength to 3+/5 to improve ability to reach for items at waist to chest height during bathing and grooming tasks.   Goal status: INITIAL   LONG TERM GOALS: Target date: 08/21/24  Pt will decrease pain in LUE to 3/10 or less to improve ability to sleep for 2+ consecutive hours without waking due to pain.   Goal status: INITIAL  2.  Pt will decrease LUE fascial restrictions to min amounts or less to improve mobility required for functional reaching tasks.   Goal status: INITIAL  3.  Pt will increase LUE A/ROM by 50 degrees to improve ability to use LUE when reaching overhead or behind back during dressing and bathing tasks.   Goal status: INITIAL  4.  Pt will increase LUE strength to 4+/5 or greater to improve ability to use LUE when lifting or carrying items during meal preparation/housework/yardwork tasks.   Goal status: INITIAL  5.  Pt will return to highest level of function using LUE as non-dominant during functional task completion.   Goal status: INITIAL   ASSESSMENT:  CLINICAL IMPRESSION: Patient is a 63 y.o. female who was seen today for occupational therapy evaluation for L Reverse Total Shoulder. Pt presents with increased pain and fascial restrictions, decreased ROM, strength, and functional use of the LUE.   PERFORMANCE DEFICITS: in functional skills including in functional skills including ADLs, IADLs, coordination, tone, ROM, strength, pain, fascial restrictions, muscle spasms, and UE functional use.  IMPAIRMENTS: are limiting patient from ADLs, IADLs, rest and sleep, work, leisure, and social participation.   COMORBIDITIES: may  have co-morbidities  that affects occupational performance. Patient will benefit from skilled OT to address above impairments and improve overall function.  MODIFICATION OR ASSISTANCE TO COMPLETE EVALUATION: Min-Moderate modification of tasks or assist with assess necessary to complete an evaluation.  OT OCCUPATIONAL PROFILE AND HISTORY: Detailed assessment: Review of records and additional review of physical, cognitive, psychosocial history related to current functional performance.  CLINICAL DECISION MAKING: Moderate - several treatment options, min-mod task modification necessary  REHAB POTENTIAL: Good  EVALUATION COMPLEXITY: Moderate      PLAN:  OT FREQUENCY: 2x/week  OT DURATION: 8 weeks  PLANNED INTERVENTIONS: 97168 OT Re-evaluation, 97535 self care/ADL training, 02889 therapeutic exercise, 97530 therapeutic activity, 97112 neuromuscular re-education, 97140 manual therapy, 97035 ultrasound, 97010 moist heat, 97032 electrical stimulation (manual), passive range of motion, functional mobility training, energy conservation, coping strategies training, patient/family education, and DME and/or AE instructions  RECOMMENDED OTHER SERVICES: N/A  CONSULTED AND AGREED WITH PLAN OF CARE: Patient  PLAN FOR NEXT SESSION: Manual Therapy, P/ROM, Pendulums, Table Slides   Valentin Nightingale, OTR/L [facility] Outpatient Rehab [phone] Sandar Krinke Jillyn Nightingale, OT 06/22/2024, 4:30 PM

## 2024-06-25 ENCOUNTER — Encounter: Payer: Self-pay | Admitting: Radiology

## 2024-06-26 ENCOUNTER — Encounter (HOSPITAL_COMMUNITY): Payer: Self-pay | Admitting: Occupational Therapy

## 2024-06-26 ENCOUNTER — Ambulatory Visit (HOSPITAL_COMMUNITY): Attending: Orthopaedic Surgery | Admitting: Occupational Therapy

## 2024-06-26 ENCOUNTER — Encounter: Payer: Self-pay | Admitting: Urology

## 2024-06-26 DIAGNOSIS — M25612 Stiffness of left shoulder, not elsewhere classified: Secondary | ICD-10-CM | POA: Diagnosis present

## 2024-06-26 DIAGNOSIS — R29898 Other symptoms and signs involving the musculoskeletal system: Secondary | ICD-10-CM | POA: Diagnosis present

## 2024-06-26 DIAGNOSIS — M25512 Pain in left shoulder: Secondary | ICD-10-CM | POA: Diagnosis present

## 2024-06-26 NOTE — Therapy (Unsigned)
 OUTPATIENT OCCUPATIONAL THERAPY ORTHO TREATMENT NOTE  Patient Name: Misty Cruz MRN: [id number] DOB:1961/06/17, 63 y.o., female Today's Date: 06/27/2024   END OF SESSION:  OT End of Session - 06/26/24 1648     Visit Number 2    Number of Visits 16    Date for Recertification  08/21/24    Authorization Type [employer]    OT Start Time 1606    OT Stop Time 1648    OT Time Calculation (min) 42 min    Activity Tolerance Patient tolerated treatment well    Behavior During Therapy [facility] for tasks assessed/performed           Past Medical History:  Diagnosis Date   Anxiety    Diabetes mellitus    History of kidney stones    Hypercholesteremia    Hypertension    Hyperthyroidism    Scoliosis    Thyroid disease    Past Surgical History:  Procedure Laterality Date   BREAST BIOPSY Right 07/14/2023   MM RT BREAST BX W LOC DEV 1ST LESION IMAGE BX SPEC STEREO GUIDE 07/14/2023 [facility]   COLONOSCOPY N/A 06/04/2013   Procedure: COLONOSCOPY;  Surgeon: Margo LITTIE Haddock, MD;  [facility];  Service: Endoscopy;  Laterality: N/A;  9:30 AM   CYSTOSCOPY WITH RETROGRADE PYELOGRAM, URETEROSCOPY AND STENT PLACEMENT Bilateral 03/03/2023   Procedure: CYSTOSCOPY WITH RETROGRADE PYELOGRAM, URETEROSCOPY AND STENT PLACEMENT;  Surgeon: Sherrilee Belvie LITTIE, MD;  Location: [facility];  Service: Urology;  Laterality: Bilateral;   CYSTOSCOPY/RETROGRADE/URETEROSCOPY/STONE EXTRACTION WITH BASKET Bilateral 03/01/2024   Procedure: CYSTOSCOPY, WITH CALCULUS REMOVAL USING BASKET;  Surgeon: Sherrilee Belvie LITTIE, MD;  Location: [facility];  Service: Urology;  Laterality: Bilateral;   CYSTOSCOPY/URETEROSCOPY/HOLMIUM LASER/STENT PLACEMENT Bilateral 03/01/2024   Procedure: BILATERAL CYSTOSCOPY/URETEROSCOPY/HOLMIUM LASER,RIGHT STENT PLACEMENT;  Surgeon: Sherrilee Belvie LITTIE, MD;  Location: [facility];  Service: Urology;  Laterality: Bilateral;   HOLMIUM LASER APPLICATION Bilateral 03/03/2023   Procedure:  HOLMIUM LASER APPLICATION;  Surgeon: Sherrilee Belvie LITTIE, MD;  Location: [facility];  Service: Urology;  Laterality: Bilateral;   HOLMIUM LASER APPLICATION Bilateral 03/01/2024   Procedure: HOLMIUM LASER APPLICATION;  Surgeon: Sherrilee Belvie LITTIE, MD;  Location: [facility];  Service: Urology;  Laterality: Bilateral;   REVERSE SHOULDER ARTHROPLASTY Left 06/07/2024   Procedure: ARTHROPLASTY, SHOULDER, TOTAL, REVERSE;  Surgeon: Onesimo Oneil LABOR, MD;  Location: [facility];  Service: Orthopedics;  Laterality: Left;   STONE EXTRACTION WITH BASKET Bilateral 03/03/2023   Procedure: STONE EXTRACTION WITH BASKET;  Surgeon: Sherrilee Belvie LITTIE, MD;  Location: [facility];  Service: Urology;  Laterality: Bilateral;   TUBAL LIGATION     Patient Active Problem List   Diagnosis Date Noted   Left rotator cuff tear 06/07/2024   Arthritis of right knee 03/20/2024   Hematuria 03/20/2024   Hypothyroidism 03/20/2024   Obesity 03/20/2024   Seasonal allergies 03/20/2024   Hypertensive disorder 11/30/2023   Former smoker 11/30/2023   Hyperlipemia 11/30/2023   Hyperlipidemia associated with type 2 diabetes mellitus (HCC) 11/30/2023   Vitamin D deficiency 11/30/2023   Nephrolithiasis 03/03/2023   Diabetes mellitus (HCC) 12/10/2015    PCP: Margarete Gibney, FNP REFERRING PROVIDER: Onesimo Oneil, MD  ONSET DATE: 06/07/24  REFERRING DIAG: L reverse total shoulder   THERAPY DIAG:  Acute pain of left shoulder  Stiffness of left shoulder, not elsewhere classified  Other symptoms and signs involving the musculoskeletal system  Rationale for Evaluation and Treatment: Rehabilitation  SUBJECTIVE:   SUBJECTIVE STATEMENT: It hasn't felt too  bad Pt accompanied by: self  PERTINENT HISTORY: 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 Yes: NPRS scale: 2/10 Pain location: anterior shoulder girdle  Pain  description: aching and heavy Aggravating factors: everything Relieving factors: tylenol   FALLS: Has patient fallen in last 6 months? Yes. Number of falls 1  PLOF: Independent  PATIENT GOALS: To get her arm use back  NEXT MD VISIT: 07/07/24  OBJECTIVE:   HAND DOMINANCE: Right  ADLs: Overall ADLs: Pt unable to reach up or behind her back, requiring mod assist for all dressing and bathing, unable to lift anything to complete cooking or cleaning. Having to sleep in a recliner - unable to sleep in bed due to pain  FUNCTIONAL OUTCOME MEASURES: Quick Dash: 79.55  UPPER EXTREMITY ROM:       Assessed in supine, er/IR adducted  Passive ROM Left eval  Shoulder flexion 103  Shoulder abduction 84  Shoulder internal rotation 90  Shoulder external rotation 30  (Blank rows = not tested)    UPPER EXTREMITY MMT:     Assessed in seated, er/IR adducted  MMT Left eval  Shoulder flexion   Shoulder abduction   Shoulder internal rotation   Shoulder external rotation   (Blank rows = not tested)  SENSATION: WFL  EDEMA: Mild swelling noted in the hand  OBSERVATIONS: Moderate fascial restriction   TODAY'S TREATMENT:                                                                                                                              DATE:   06/26/24 -Manual Therapy: myofascial release and trigger point applied to biceps, trapezius, and scapular region in order to reduce fascial restrictions and improve pain and ROM.  -P/Rom: supine - flexion, abduction, er/IR, x10 -Pendulums: 2x30  -Ball Rolls: flexion, abduction, x10  06/22/24 -Elbow and Wrist ROM: elbow flexion/extension, wrist flexion/extension, supination/pronation, x10 -Pendulums: 2x30    PATIENT EDUCATION: Education details: Table Slides Person educated: Patient Education method: Explanation, Demonstration, and Handouts Education comprehension: verbalized understanding and returned demonstration  HOME  EXERCISE PROGRAM: 10/31: Elbow and Wrist ROM and Pendulums 11/4: Table Slides  GOALS: Goals reviewed with patient? Yes   SHORT TERM GOALS: Target date: 07/22/24  Pt will be provided with and educated on HEP to improve mobility in LUE required for use during ADL completion.   Goal status: IN PROGRESS  2.  Pt will increase LUE P/ROM by 40 degrees to improve ability to use LUE during dressing tasks with minimal compensatory techniques.   Goal status: IN PROGRESS  3.  Pt will increase LUE strength to 3+/5 to improve ability to reach for items at waist to chest height during bathing and grooming tasks.   Goal status: IN PROGRESS   LONG TERM GOALS: Target date: 08/21/24  Pt will decrease pain in LUE  to 3/10 or less to improve ability to sleep for 2+ consecutive hours without waking due to pain.   Goal status: IN PROGRESS  2.  Pt will decrease LUE fascial restrictions to min amounts or less to improve mobility required for functional reaching tasks.   Goal status: IN PROGRESS  3.  Pt will increase LUE A/ROM by 50 degrees to improve ability to use LUE when reaching overhead or behind back during dressing and bathing tasks.   Goal status: IN PROGRESS  4.  Pt will increase LUE strength to 4+/5 or greater to improve ability to use LUE when lifting or carrying items during meal preparation/housework/yardwork tasks.   Goal status: IN PROGRESS  5.  Pt will return to highest level of function using LUE as non-dominant during functional task completion.   Goal status: IN PROGRESS   ASSESSMENT:  CLINICAL IMPRESSION: This session pt presents highly guarded throughout the entire session. She remains tense and unable to relax. P/ROM and Pendulums were heavily limited due to pt guarding. She reports minimal pain at this time. OT providing verbal and tactile cuing for breathing techniques for pain relief, as well as for positioning and technique.   PERFORMANCE DEFICITS: in functional  skills including in functional skills including ADLs, IADLs, coordination, tone, ROM, strength, pain, fascial restrictions, muscle spasms, and UE functional use.   PLAN:  OT FREQUENCY: 2x/week  OT DURATION: 8 weeks  PLANNED INTERVENTIONS: 97168 OT Re-evaluation, 97535 self care/ADL training, 02889 therapeutic exercise, 97530 therapeutic activity, 97112 neuromuscular re-education, 97140 manual therapy, 97035 ultrasound, 97010 moist heat, 97032 electrical stimulation (manual), passive range of motion, functional mobility training, energy conservation, coping strategies training, patient/family education, and DME and/or AE instructions  RECOMMENDED OTHER SERVICES: N/A  CONSULTED AND AGREED WITH PLAN OF CARE: Patient  PLAN FOR NEXT SESSION: Manual Therapy, P/ROM, Pendulums, Table Slides   Valentin Nightingale, OTR/L [facility] [phone] Jossue Rubenstein Jillyn Nightingale, OT 06/27/2024, 5:12 PM

## 2024-06-26 NOTE — Patient Instructions (Signed)

## 2024-07-04 ENCOUNTER — Ambulatory Visit (HOSPITAL_COMMUNITY): Admitting: Occupational Therapy

## 2024-07-04 ENCOUNTER — Encounter (HOSPITAL_COMMUNITY): Payer: Self-pay | Admitting: Occupational Therapy

## 2024-07-04 DIAGNOSIS — R29898 Other symptoms and signs involving the musculoskeletal system: Secondary | ICD-10-CM

## 2024-07-04 DIAGNOSIS — M25512 Pain in left shoulder: Secondary | ICD-10-CM

## 2024-07-04 DIAGNOSIS — M25612 Stiffness of left shoulder, not elsewhere classified: Secondary | ICD-10-CM

## 2024-07-04 NOTE — Therapy (Unsigned)
 OUTPATIENT OCCUPATIONAL THERAPY ORTHO TREATMENT NOTE  Patient Name: Misty Cruz MRN: [id number] DOB:31-Jul-1961, 63 y.o., female Today's Date: 07/04/2024   END OF SESSION:     Past Medical History:  Diagnosis Date   Anxiety    Diabetes mellitus    History of kidney stones    Hypercholesteremia    Hypertension    Hyperthyroidism    Scoliosis    Thyroid disease    Past Surgical History:  Procedure Laterality Date   BREAST BIOPSY Right 07/14/2023   MM RT BREAST BX W LOC DEV 1ST LESION IMAGE BX SPEC STEREO GUIDE 07/14/2023 [facility]   COLONOSCOPY N/A 06/04/2013   Procedure: COLONOSCOPY;  Surgeon: Margo LITTIE Haddock, MD;  [facility];  Service: Endoscopy;  Laterality: N/A;  9:30 AM   CYSTOSCOPY WITH RETROGRADE PYELOGRAM, URETEROSCOPY AND STENT PLACEMENT Bilateral 03/03/2023   Procedure: CYSTOSCOPY WITH RETROGRADE PYELOGRAM, URETEROSCOPY AND STENT PLACEMENT;  Surgeon: Sherrilee Belvie LITTIE, MD;  Location: [facility];  Service: Urology;  Laterality: Bilateral;   CYSTOSCOPY/RETROGRADE/URETEROSCOPY/STONE EXTRACTION WITH BASKET Bilateral 03/01/2024   Procedure: CYSTOSCOPY, WITH CALCULUS REMOVAL USING BASKET;  Surgeon: Sherrilee Belvie LITTIE, MD;  Location: [facility];  Service: Urology;  Laterality: Bilateral;   CYSTOSCOPY/URETEROSCOPY/HOLMIUM LASER/STENT PLACEMENT Bilateral 03/01/2024   Procedure: BILATERAL CYSTOSCOPY/URETEROSCOPY/HOLMIUM LASER,RIGHT STENT PLACEMENT;  Surgeon: Sherrilee Belvie LITTIE, MD;  Location: [facility];  Service: Urology;  Laterality: Bilateral;   HOLMIUM LASER APPLICATION Bilateral 03/03/2023   Procedure: HOLMIUM LASER APPLICATION;  Surgeon: Sherrilee Belvie LITTIE, MD;  Location: [facility];  Service: Urology;  Laterality: Bilateral;   HOLMIUM LASER APPLICATION Bilateral 03/01/2024   Procedure: HOLMIUM LASER APPLICATION;  Surgeon: Sherrilee Belvie LITTIE, MD;  Location: [facility];  Service: Urology;  Laterality: Bilateral;   REVERSE SHOULDER ARTHROPLASTY Left 06/07/2024    Procedure: ARTHROPLASTY, SHOULDER, TOTAL, REVERSE;  Surgeon: Onesimo Oneil LABOR, MD;  Location: [facility];  Service: Orthopedics;  Laterality: Left;   STONE EXTRACTION WITH BASKET Bilateral 03/03/2023   Procedure: STONE EXTRACTION WITH BASKET;  Surgeon: Sherrilee Belvie LITTIE, MD;  Location: [facility];  Service: Urology;  Laterality: Bilateral;   TUBAL LIGATION     Patient Active Problem List   Diagnosis Date Noted   Left rotator cuff tear 06/07/2024   Arthritis of right knee 03/20/2024   Hematuria 03/20/2024   Hypothyroidism 03/20/2024   Obesity 03/20/2024   Seasonal allergies 03/20/2024   Hypertensive disorder 11/30/2023   Former smoker 11/30/2023   Hyperlipemia 11/30/2023   Hyperlipidemia associated with type 2 diabetes mellitus (HCC) 11/30/2023   Vitamin D deficiency 11/30/2023   Nephrolithiasis 03/03/2023   Diabetes mellitus (HCC) 12/10/2015    PCP: Margarete Gibney, FNP REFERRING PROVIDER: Onesimo Oneil, MD  ONSET DATE: 06/07/24  REFERRING DIAG: L reverse total shoulder   THERAPY DIAG:  No diagnosis found.  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 Yes: NPRS scale: 7/10 Pain location: anterior shoulder girdle  Pain description: aching and heavy Aggravating factors: everything Relieving factors: tylenol   FALLS: Has patient fallen in last 6 months? Yes. Number of falls 1  PLOF: Independent  PATIENT GOALS: To get her arm use back  NEXT MD VISIT: 07/07/24  OBJECTIVE:   HAND DOMINANCE: Right  ADLs: Overall ADLs: Pt unable to reach  up or behind her back, requiring mod assist for all dressing and bathing, unable to lift anything to complete cooking or cleaning. Having to sleep in a  recliner - unable to sleep in bed due to pain  FUNCTIONAL OUTCOME MEASURES: Quick Dash: 79.55  UPPER EXTREMITY ROM:       Assessed in supine, er/IR adducted  Passive ROM Left eval  Shoulder flexion 103  Shoulder abduction 84  Shoulder internal rotation 90  Shoulder external rotation 30  (Blank rows = not tested)    UPPER EXTREMITY MMT:     Assessed in seated, er/IR adducted  MMT Left eval  Shoulder flexion   Shoulder abduction   Shoulder internal rotation   Shoulder external rotation   (Blank rows = not tested)  SENSATION: WFL  EDEMA: Mild swelling noted in the hand  OBSERVATIONS: Moderate fascial restriction   TODAY'S TREATMENT:                                                                                                                              DATE:   07/04/24 -Manual Therapy: myofascial release and trigger point applied to biceps, trapezius, and scapular region in order to reduce fascial restrictions and improve pain and ROM.  -P/Rom: supine - flexion, abduction, er/IR, horizontal abduction x10 -Wall Washes: 2x60 -Thumb Tacs: 2x45 -Scapular ROM: retraction, elevation/depression, extension, x10  06/26/24 -Manual Therapy: myofascial release and trigger point applied to biceps, trapezius, and scapular region in order to reduce fascial restrictions and improve pain and ROM.  -P/Rom: supine - flexion, abduction, er/IR, x10 -Pendulums: 2x30  -Ball Rolls: flexion, abduction, x10  06/22/24 -Elbow and Wrist ROM: elbow flexion/extension, wrist flexion/extension, supination/pronation, x10 -Pendulums: 2x30    PATIENT EDUCATION: Education details: Table Slides Person educated: Patient Education method: Explanation, Demonstration, and Handouts Education comprehension: verbalized understanding and returned demonstration  HOME EXERCISE PROGRAM: 10/31: Elbow and Wrist ROM and Pendulums 11/4: Table Slides  GOALS: Goals reviewed with patient?  Yes   SHORT TERM GOALS: Target date: 07/22/24  Pt will be provided with and educated on HEP to improve mobility in LUE required for use during ADL completion.   Goal status: IN PROGRESS  2.  Pt will increase LUE P/ROM by 40 degrees to improve ability to use LUE during dressing tasks with minimal compensatory techniques.   Goal status: IN PROGRESS  3.  Pt will increase LUE strength to 3+/5 to improve ability to reach for items at waist to chest height during bathing and grooming tasks.   Goal status: IN PROGRESS   LONG TERM GOALS: Target date: 08/21/24  Pt will decrease pain in LUE to 3/10 or less to improve ability to sleep for 2+ consecutive hours without waking due to pain.   Goal status: IN PROGRESS  2.  Pt will decrease LUE fascial restrictions to min amounts or less to improve mobility required for functional reaching tasks.   Goal status: IN PROGRESS  3.  Pt will increase LUE A/ROM by 50 degrees to improve ability to use LUE when reaching overhead or behind back during dressing and bathing tasks.   Goal status: IN PROGRESS  4.  Pt will increase LUE strength to 4+/5 or greater to improve ability to use LUE when lifting or carrying items during meal preparation/housework/yardwork tasks.   Goal status: IN PROGRESS  5.  Pt will return to highest level of function using LUE as non-dominant during functional task completion.   Goal status: IN PROGRESS   ASSESSMENT:  CLINICAL IMPRESSION: This session pt presents highly guarded throughout the entire session. She remains tense and unable to relax. P/ROM and Pendulums were heavily limited due to pt guarding. She reports minimal pain at this time. OT providing verbal and tactile cuing for breathing techniques for pain relief, as well as for positioning and technique.   PERFORMANCE DEFICITS: in functional skills including in functional skills including ADLs, IADLs, coordination, tone, ROM, strength, pain, fascial restrictions,  muscle spasms, and UE functional use.   PLAN:  OT FREQUENCY: 2x/week  OT DURATION: 8 weeks  PLANNED INTERVENTIONS: 97168 OT Re-evaluation, 97535 self care/ADL training, 02889 therapeutic exercise, 97530 therapeutic activity, 97112 neuromuscular re-education, 97140 manual therapy, 97035 ultrasound, 97010 moist heat, 97032 electrical stimulation (manual), passive range of motion, functional mobility training, energy conservation, coping strategies training, patient/family education, and DME and/or AE instructions  RECOMMENDED OTHER SERVICES: N/A  CONSULTED AND AGREED WITH PLAN OF CARE: Patient  PLAN FOR NEXT SESSION: Manual Therapy, P/ROM, Pendulums, Table Slides   Valentin Nightingale, OTR/L [facility] [phone] Hanifah Royse Jillyn Nightingale, OT 07/04/2024, 3:24 PM

## 2024-07-06 ENCOUNTER — Ambulatory Visit (HOSPITAL_COMMUNITY): Admitting: Occupational Therapy

## 2024-07-09 ENCOUNTER — Ambulatory Visit (HOSPITAL_COMMUNITY): Admitting: Occupational Therapy

## 2024-07-09 ENCOUNTER — Encounter (HOSPITAL_COMMUNITY): Payer: Self-pay | Admitting: Occupational Therapy

## 2024-07-09 DIAGNOSIS — M25512 Pain in left shoulder: Secondary | ICD-10-CM | POA: Diagnosis not present

## 2024-07-09 DIAGNOSIS — M25612 Stiffness of left shoulder, not elsewhere classified: Secondary | ICD-10-CM

## 2024-07-09 DIAGNOSIS — R29898 Other symptoms and signs involving the musculoskeletal system: Secondary | ICD-10-CM

## 2024-07-09 NOTE — Therapy (Signed)
 OUTPATIENT OCCUPATIONAL THERAPY ORTHO TREATMENT NOTE  Patient Name: Misty Cruz MRN: [id number] DOB:[birth year], 63 y.o., female Today's Date: 07/09/2024   END OF SESSION:  OT End of Session - 07/09/24 1348     Visit Number 4    Number of Visits 16    Date for Recertification  08/21/24    Authorization Type [employer]    OT Start Time 1310    OT Stop Time 1348    OT Time Calculation (min) 38 min    Activity Tolerance Patient tolerated treatment well    Behavior During Therapy WFL for tasks assessed/performed          Past Medical History:  Diagnosis Date   Anxiety    Diabetes mellitus    History of kidney stones    Hypercholesteremia    Hypertension    Hyperthyroidism    Scoliosis    Thyroid disease    Past Surgical History:  Procedure Laterality Date   BREAST BIOPSY Right 07/14/2023   MM RT BREAST BX W LOC DEV 1ST LESION IMAGE BX SPEC STEREO GUIDE 07/14/2023 [facility]   COLONOSCOPY N/A 06/04/2013   Procedure: COLONOSCOPY;  Surgeon: Margo LITTIE Haddock, MD;  [facility];  Service: Endoscopy;  Laterality: N/A;  9:30 AM   CYSTOSCOPY WITH RETROGRADE PYELOGRAM, URETEROSCOPY AND STENT PLACEMENT Bilateral 03/03/2023   Procedure: CYSTOSCOPY WITH RETROGRADE PYELOGRAM, URETEROSCOPY AND STENT PLACEMENT;  Surgeon: Sherrilee Belvie LITTIE, MD;  Location: [facility];  Service: Urology;  Laterality: Bilateral;   CYSTOSCOPY/RETROGRADE/URETEROSCOPY/STONE EXTRACTION WITH BASKET Bilateral 03/01/2024   Procedure: CYSTOSCOPY, WITH CALCULUS REMOVAL USING BASKET;  Surgeon: Sherrilee Belvie LITTIE, MD;  Location: [facility];  Service: Urology;  Laterality: Bilateral;   CYSTOSCOPY/URETEROSCOPY/HOLMIUM LASER/STENT PLACEMENT Bilateral 03/01/2024   Procedure: BILATERAL CYSTOSCOPY/URETEROSCOPY/HOLMIUM LASER,RIGHT STENT PLACEMENT;  Surgeon: Sherrilee Belvie LITTIE, MD;  Location: [facility];  Service: Urology;  Laterality: Bilateral;   HOLMIUM LASER APPLICATION Bilateral 03/03/2023   Procedure:  HOLMIUM LASER APPLICATION;  Surgeon: Sherrilee Belvie LITTIE, MD;  Location: [facility];  Service: Urology;  Laterality: Bilateral;   HOLMIUM LASER APPLICATION Bilateral 03/01/2024   Procedure: HOLMIUM LASER APPLICATION;  Surgeon: Sherrilee Belvie LITTIE, MD;  Location: [facility];  Service: Urology;  Laterality: Bilateral;   REVERSE SHOULDER ARTHROPLASTY Left 06/07/2024   Procedure: ARTHROPLASTY, SHOULDER, TOTAL, REVERSE;  Surgeon: Onesimo Oneil LABOR, MD;  Location: [facility];  Service: Orthopedics;  Laterality: Left;   STONE EXTRACTION WITH BASKET Bilateral 03/03/2023   Procedure: STONE EXTRACTION WITH BASKET;  Surgeon: Sherrilee Belvie LITTIE, MD;  Location: [facility];  Service: Urology;  Laterality: Bilateral;   TUBAL LIGATION     Patient Active Problem List   Diagnosis Date Noted   Left rotator cuff tear 06/07/2024   Arthritis of right knee 03/20/2024   Hematuria 03/20/2024   Hypothyroidism 03/20/2024   Obesity 03/20/2024   Seasonal allergies 03/20/2024   Hypertensive disorder 11/30/2023   Former smoker 11/30/2023   Hyperlipemia 11/30/2023   Hyperlipidemia associated with type 2 diabetes mellitus (HCC) 11/30/2023   Vitamin D deficiency 11/30/2023   Nephrolithiasis 03/03/2023   Diabetes mellitus (HCC) 12/10/2015    PCP: Margarete Gibney, FNP REFERRING PROVIDER: Onesimo Oneil, MD  ONSET DATE: 06/07/24  REFERRING DIAG: L reverse total shoulder   THERAPY DIAG:  Acute pain of left shoulder  Stiffness of left shoulder, not elsewhere classified  Other symptoms and signs involving the musculoskeletal system  Rationale for Evaluation and Treatment: Rehabilitation  SUBJECTIVE:   SUBJECTIVE STATEMENT: It's really tight Pt accompanied  by: self  PERTINENT HISTORY: 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 Yes: NPRS scale: 7/10 Pain location: anterior shoulder girdle  Pain  description: aching and heavy Aggravating factors: everything Relieving factors: tylenol   FALLS: Has patient fallen in last 6 months? Yes. Number of falls 1  PLOF: Independent  PATIENT GOALS: To get her arm use back  NEXT MD VISIT: 07/18/24  OBJECTIVE:   HAND DOMINANCE: Right  ADLs: Overall ADLs: Pt unable to reach up or behind her back, requiring mod assist for all dressing and bathing, unable to lift anything to complete cooking or cleaning. Having to sleep in a recliner - unable to sleep in bed due to pain  FUNCTIONAL OUTCOME MEASURES: Quick Dash: 79.55  UPPER EXTREMITY ROM:       Assessed in supine, er/IR adducted  Passive ROM Left eval  Shoulder flexion 103  Shoulder abduction 84  Shoulder internal rotation 90  Shoulder external rotation 30  (Blank rows = not tested)    UPPER EXTREMITY MMT:     Assessed in seated, er/IR adducted  MMT Left eval  Shoulder flexion   Shoulder abduction   Shoulder internal rotation   Shoulder external rotation   (Blank rows = not tested)  SENSATION: WFL  EDEMA: Mild swelling noted in the hand  OBSERVATIONS: Moderate fascial restriction   TODAY'S TREATMENT:                                                                                                                              DATE:   07/09/24 -Manual Therapy: myofascial release and trigger point applied to biceps, trapezius, and scapular region in order to reduce fascial restrictions and improve pain and ROM.  -P/Rom: supine - flexion, abduction, er/IR, horizontal abduction x10 -Wall Washes: 2x60 -Thumb Tacs: 2x60 -Scapular ROM: retraction, elevation/depression, extension, x10 -Ball Rolls: flexion, abduction, x12 -Pulleys: flexion, abduction, x10  07/04/24 -Manual Therapy: myofascial release and trigger point applied to biceps, trapezius, and scapular region in order to reduce fascial restrictions and improve pain and ROM.  -P/Rom: supine - flexion,  abduction, er/IR, horizontal abduction x10 -Wall Washes: 2x60 -Thumb Tacs: 2x45 -Scapular ROM: retraction, elevation/depression, extension, x10  06/26/24 -Manual Therapy: myofascial release and trigger point applied to biceps, trapezius, and scapular region in order to reduce fascial restrictions and improve pain and ROM.  -P/Rom: supine - flexion, abduction, er/IR, x10 -Pendulums: 2x30  -Ball Rolls: flexion, abduction, x10  06/22/24 -Elbow and Wrist ROM: elbow flexion/extension, wrist flexion/extension, supination/pronation, x10 -Pendulums: 2x30    PATIENT EDUCATION: Education details: Table Slides Person educated: Patient Education method: Explanation, Demonstration, and Handouts Education comprehension: verbalized understanding and returned demonstration  HOME EXERCISE PROGRAM: 10/31: Elbow and Wrist ROM and Pendulums 11/4: Table Slides  GOALS: Goals reviewed with patient? Yes   SHORT TERM GOALS: Target date: 07/22/24  Pt will be provided with and educated on HEP to improve mobility in LUE required for use during ADL completion.   Goal status: IN PROGRESS  2.  Pt will increase LUE P/ROM by 40 degrees to improve ability to use LUE during dressing tasks with minimal compensatory techniques.   Goal status: IN PROGRESS  3.  Pt will increase LUE strength to 3+/5 to improve ability to reach for items at waist to chest height during bathing and grooming tasks.   Goal status: IN PROGRESS   LONG TERM GOALS: Target date: 08/21/24  Pt will decrease pain in LUE to 3/10 or less to improve ability to sleep for 2+ consecutive hours without waking due to pain.   Goal status: IN PROGRESS  2.  Pt will decrease LUE fascial restrictions to min amounts or less to improve mobility required for functional reaching tasks.   Goal status: IN PROGRESS  3.  Pt will increase LUE A/ROM by 50 degrees to improve ability to use LUE when reaching overhead or behind back during dressing and  bathing tasks.   Goal status: IN PROGRESS  4.  Pt will increase LUE strength to 4+/5 or greater to improve ability to use LUE when lifting or carrying items during meal preparation/housework/yardwork tasks.   Goal status: IN PROGRESS  5.  Pt will return to highest level of function using LUE as non-dominant during functional task completion.   Goal status: IN PROGRESS   ASSESSMENT:  CLINICAL IMPRESSION: Pt continues to remain highly guarded with all passive exercises. She resists all ROM after approximately 50% of full range and uses her entire body for low level exercises such as thumb tacs and wall washes below chest level. OT providing constant cuing to slow movements down and work on relaxing into them, as well as pushing/stretching out a little further. Verbal and tactile cuing provided for positioning and technique throughout session.   PERFORMANCE DEFICITS: in functional skills including in functional skills including ADLs, IADLs, coordination, tone, ROM, strength, pain, fascial restrictions, muscle spasms, and UE functional use.   PLAN:  OT FREQUENCY: 2x/week  OT DURATION: 8 weeks  PLANNED INTERVENTIONS: 97168 OT Re-evaluation, 97535 self care/ADL training, 02889 therapeutic exercise, 97530 therapeutic activity, 97112 neuromuscular re-education, 97140 manual therapy, 97035 ultrasound, 97010 moist heat, 97032 electrical stimulation (manual), passive range of motion, functional mobility training, energy conservation, coping strategies training, patient/family education, and DME and/or AE instructions  RECOMMENDED OTHER SERVICES: N/A  CONSULTED AND AGREED WITH PLAN OF CARE: Patient  PLAN FOR NEXT SESSION: Manual Therapy, P/ROM, Pendulums, Table Slides   Valentin Nightingale, OTR/L [facility] [phone] Michaiah Holsopple Jillyn Nightingale, OT 07/09/2024, 2:44 PM

## 2024-07-12 ENCOUNTER — Encounter (HOSPITAL_COMMUNITY): Payer: Self-pay | Admitting: Occupational Therapy

## 2024-07-12 ENCOUNTER — Ambulatory Visit (HOSPITAL_COMMUNITY): Admitting: Occupational Therapy

## 2024-07-12 DIAGNOSIS — R29898 Other symptoms and signs involving the musculoskeletal system: Secondary | ICD-10-CM

## 2024-07-12 DIAGNOSIS — M25512 Pain in left shoulder: Secondary | ICD-10-CM | POA: Diagnosis not present

## 2024-07-12 DIAGNOSIS — M25612 Stiffness of left shoulder, not elsewhere classified: Secondary | ICD-10-CM

## 2024-07-12 NOTE — Therapy (Signed)
 OUTPATIENT OCCUPATIONAL THERAPY ORTHO TREATMENT NOTE  Patient Name: Misty Cruz MRN: [id number] DOB:June 17, 1961, 63 y.o., female Today's Date: 07/12/2024   END OF SESSION:  OT End of Session - 07/12/24 1525     Visit Number 5    Number of Visits 16    Date for Recertification  08/21/24    Authorization Type [employer]    OT Start Time 1313    OT Stop Time 1352    OT Time Calculation (min) 39 min    Activity Tolerance Patient tolerated treatment well    Behavior During Therapy [facility] for tasks assessed/performed          Past Medical History:  Diagnosis Date   Anxiety    Diabetes mellitus    History of kidney stones    Hypercholesteremia    Hypertension    Hyperthyroidism    Scoliosis    Thyroid disease    Past Surgical History:  Procedure Laterality Date   BREAST BIOPSY Right 07/14/2023   MM RT BREAST BX W LOC DEV 1ST LESION IMAGE BX SPEC STEREO GUIDE 07/14/2023 [facility]   COLONOSCOPY N/A 06/04/2013   Procedure: COLONOSCOPY;  Surgeon: Margo LITTIE Haddock, MD;  [facility];  Service: Endoscopy;  Laterality: N/A;  9:30 AM   CYSTOSCOPY WITH RETROGRADE PYELOGRAM, URETEROSCOPY AND STENT PLACEMENT Bilateral 03/03/2023   Procedure: CYSTOSCOPY WITH RETROGRADE PYELOGRAM, URETEROSCOPY AND STENT PLACEMENT;  Surgeon: Sherrilee Belvie LITTIE, MD;  Location: [facility];  Service: Urology;  Laterality: Bilateral;   CYSTOSCOPY/RETROGRADE/URETEROSCOPY/STONE EXTRACTION WITH BASKET Bilateral 03/01/2024   Procedure: CYSTOSCOPY, WITH CALCULUS REMOVAL USING BASKET;  Surgeon: Sherrilee Belvie LITTIE, MD;  Location: [facility];  Service: Urology;  Laterality: Bilateral;   CYSTOSCOPY/URETEROSCOPY/HOLMIUM LASER/STENT PLACEMENT Bilateral 03/01/2024   Procedure: BILATERAL CYSTOSCOPY/URETEROSCOPY/HOLMIUM LASER,RIGHT STENT PLACEMENT;  Surgeon: Sherrilee Belvie LITTIE, MD;  Location: [facility];  Service: Urology;  Laterality: Bilateral;   HOLMIUM LASER APPLICATION Bilateral 03/03/2023   Procedure:  HOLMIUM LASER APPLICATION;  Surgeon: Sherrilee Belvie LITTIE, MD;  Location: [facility];  Service: Urology;  Laterality: Bilateral;   HOLMIUM LASER APPLICATION Bilateral 03/01/2024   Procedure: HOLMIUM LASER APPLICATION;  Surgeon: Sherrilee Belvie LITTIE, MD;  Location: [facility];  Service: Urology;  Laterality: Bilateral;   REVERSE SHOULDER ARTHROPLASTY Left 06/07/2024   Procedure: ARTHROPLASTY, SHOULDER, TOTAL, REVERSE;  Surgeon: Onesimo Oneil LABOR, MD;  Location: [facility];  Service: Orthopedics;  Laterality: Left;   STONE EXTRACTION WITH BASKET Bilateral 03/03/2023   Procedure: STONE EXTRACTION WITH BASKET;  Surgeon: Sherrilee Belvie LITTIE, MD;  Location: [facility];  Service: Urology;  Laterality: Bilateral;   TUBAL LIGATION     Patient Active Problem List   Diagnosis Date Noted   Left rotator cuff tear 06/07/2024   Arthritis of right knee 03/20/2024   Hematuria 03/20/2024   Hypothyroidism 03/20/2024   Obesity 03/20/2024   Seasonal allergies 03/20/2024   Hypertensive disorder 11/30/2023   Former smoker 11/30/2023   Hyperlipemia 11/30/2023   Hyperlipidemia associated with type 2 diabetes mellitus (HCC) 11/30/2023   Vitamin D deficiency 11/30/2023   Nephrolithiasis 03/03/2023   Diabetes mellitus (HCC) 12/10/2015    PCP: Margarete Gibney, FNP REFERRING PROVIDER: Onesimo Oneil, MD  ONSET DATE: 06/07/24  REFERRING DIAG: L reverse total shoulder   THERAPY DIAG:  Acute pain of left shoulder  Stiffness of left shoulder, not elsewhere classified  Other symptoms and signs involving the musculoskeletal system  Rationale for Evaluation and Treatment: Rehabilitation  SUBJECTIVE:   SUBJECTIVE STATEMENT: It's really tight Pt accompanied  by: self  PERTINENT HISTORY: 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 Yes: NPRS scale: 7/10 Pain location: anterior shoulder girdle  Pain  description: aching and heavy Aggravating factors: everything Relieving factors: tylenol   FALLS: Has patient fallen in last 6 months? Yes. Number of falls 1  PLOF: Independent  PATIENT GOALS: To get her arm use back  NEXT MD VISIT: 07/18/24  OBJECTIVE:   HAND DOMINANCE: Right  ADLs: Overall ADLs: Pt unable to reach up or behind her back, requiring mod assist for all dressing and bathing, unable to lift anything to complete cooking or cleaning. Having to sleep in a recliner - unable to sleep in bed due to pain  FUNCTIONAL OUTCOME MEASURES: Quick Dash: 79.55  UPPER EXTREMITY ROM:       Assessed in supine, er/IR adducted  Passive ROM Left eval  Shoulder flexion 103  Shoulder abduction 84  Shoulder internal rotation 90  Shoulder external rotation 30  (Blank rows = not tested)    UPPER EXTREMITY MMT:     Assessed in seated, er/IR adducted  MMT Left eval  Shoulder flexion   Shoulder abduction   Shoulder internal rotation   Shoulder external rotation   (Blank rows = not tested)  SENSATION: WFL  EDEMA: Mild swelling noted in the hand  OBSERVATIONS: Moderate fascial restriction   TODAY'S TREATMENT:                                                                                                                              DATE:   07/12/24 -Manual Therapy: myofascial release and trigger point applied to biceps, trapezius, and scapular region in order to reduce fascial restrictions and improve pain and ROM.  -P/Rom: supine - flexion, abduction, er/IR, horizontal abduction x10 -AA/ROM: supine - protraction, flexion, abduction, horizontal abduction, er/IR, x10  07/09/24 -Manual Therapy: myofascial release and trigger point applied to biceps, trapezius, and scapular region in order to reduce fascial restrictions and improve pain and ROM.  -P/Rom: supine - flexion, abduction, er/IR, horizontal abduction x10 -Wall Washes: 2x60 -Thumb Tacs: 2x60 -Scapular ROM:  retraction, elevation/depression, extension, x10 -Ball Rolls: flexion, abduction, x12 -Pulleys: flexion, abduction, x10  07/04/24 -Manual Therapy: myofascial release and trigger point applied to biceps, trapezius, and scapular region in order to reduce fascial restrictions and improve pain and ROM.  -P/Rom: supine - flexion, abduction, er/IR, horizontal abduction x10 -Wall Washes: 2x60 -Thumb Tacs: 2x45 -Scapular ROM: retraction, elevation/depression, extension, x10   PATIENT EDUCATION: Education details: AA/ROM Person educated: Patient Education method: Programmer, Multimedia, Demonstration, and Handouts Education comprehension: verbalized understanding and returned demonstration  HOME EXERCISE PROGRAM: 10/31: Elbow and Wrist ROM and Pendulums 11/4: Table Slides 11/20: AA/ROM  GOALS: Goals reviewed with patient? Yes   SHORT TERM GOALS: Target date: 07/22/24  Pt will be provided with and educated on HEP to  improve mobility in LUE required for use during ADL completion.   Goal status: IN PROGRESS  2.  Pt will increase LUE P/ROM by 40 degrees to improve ability to use LUE during dressing tasks with minimal compensatory techniques.   Goal status: IN PROGRESS  3.  Pt will increase LUE strength to 3+/5 to improve ability to reach for items at waist to chest height during bathing and grooming tasks.   Goal status: IN PROGRESS   LONG TERM GOALS: Target date: 08/21/24  Pt will decrease pain in LUE to 3/10 or less to improve ability to sleep for 2+ consecutive hours without waking due to pain.   Goal status: IN PROGRESS  2.  Pt will decrease LUE fascial restrictions to min amounts or less to improve mobility required for functional reaching tasks.   Goal status: IN PROGRESS  3.  Pt will increase LUE A/ROM by 50 degrees to improve ability to use LUE when reaching overhead or behind back during dressing and bathing tasks.   Goal status: IN PROGRESS  4.  Pt will increase LUE  strength to 4+/5 or greater to improve ability to use LUE when lifting or carrying items during meal preparation/housework/yardwork tasks.   Goal status: IN PROGRESS  5.  Pt will return to highest level of function using LUE as non-dominant during functional task completion.   Goal status: IN PROGRESS   ASSESSMENT:  CLINICAL IMPRESSION: This session pt demonstrated decreased tightness and discomfort. She tolerated further P/ROM to 70% of full ROM. OT had pt start on low level AA/ROM to max of 90* with smooth movements. Overall this session pt had minimal pain and moderate fatigue. Verbal and tactile cuing provided for positioning and technique throughout session.   PERFORMANCE DEFICITS: in functional skills including in functional skills including ADLs, IADLs, coordination, tone, ROM, strength, pain, fascial restrictions, muscle spasms, and UE functional use.   PLAN:  OT FREQUENCY: 2x/week  OT DURATION: 8 weeks  PLANNED INTERVENTIONS: 97168 OT Re-evaluation, 97535 self care/ADL training, 02889 therapeutic exercise, 97530 therapeutic activity, 97112 neuromuscular re-education, 97140 manual therapy, 97035 ultrasound, 97010 moist heat, 97032 electrical stimulation (manual), passive range of motion, functional mobility training, energy conservation, coping strategies training, patient/family education, and DME and/or AE instructions  RECOMMENDED OTHER SERVICES: N/A  CONSULTED AND AGREED WITH PLAN OF CARE: Patient  PLAN FOR NEXT SESSION: Manual Therapy, P/ROM, Pendulums, Table Slides   Valentin Nightingale, OTR/L [facility] [phone] Guenther [place] Jillyn Nightingale, OT 07/12/2024, 3:27 PM

## 2024-07-12 NOTE — Patient Instructions (Signed)

## 2024-07-17 ENCOUNTER — Ambulatory Visit (HOSPITAL_COMMUNITY): Admitting: Occupational Therapy

## 2024-07-17 ENCOUNTER — Encounter (HOSPITAL_COMMUNITY): Payer: Self-pay | Admitting: Occupational Therapy

## 2024-07-17 ENCOUNTER — Ambulatory Visit: Admitting: Orthopedic Surgery

## 2024-07-17 DIAGNOSIS — R29898 Other symptoms and signs involving the musculoskeletal system: Secondary | ICD-10-CM

## 2024-07-17 DIAGNOSIS — M25512 Pain in left shoulder: Secondary | ICD-10-CM

## 2024-07-17 DIAGNOSIS — M25612 Stiffness of left shoulder, not elsewhere classified: Secondary | ICD-10-CM

## 2024-07-17 NOTE — Therapy (Unsigned)
 OUTPATIENT OCCUPATIONAL THERAPY ORTHO TREATMENT NOTE  Patient Name: Misty Cruz MRN: [id number] DOB:[birth year], 63 y.o., female Today's Date: 07/18/2024   END OF SESSION:  OT End of Session - 07/17/24 1623     Visit Number 6    Number of Visits 16    Date for Recertification  08/21/24    Authorization Type [employer]    OT Start Time 1543    OT Stop Time 1623    OT Time Calculation (min) 40 min    Activity Tolerance Patient tolerated treatment well    Behavior During Therapy [facility] for tasks assessed/performed          Past Medical History:  Diagnosis Date   Anxiety    Diabetes mellitus    History of kidney stones    Hypercholesteremia    Hypertension    Hyperthyroidism    Scoliosis    Thyroid disease    Past Surgical History:  Procedure Laterality Date   BREAST BIOPSY Right 07/14/2023   MM RT BREAST BX W LOC DEV 1ST LESION IMAGE BX SPEC STEREO GUIDE 07/14/2023 [facility]   COLONOSCOPY N/A 06/04/2013   Procedure: COLONOSCOPY;  Surgeon: Margo LITTIE Haddock, MD;  [facility];  Service: Endoscopy;  Laterality: N/A;  9:30 AM   CYSTOSCOPY WITH RETROGRADE PYELOGRAM, URETEROSCOPY AND STENT PLACEMENT Bilateral 03/03/2023   Procedure: CYSTOSCOPY WITH RETROGRADE PYELOGRAM, URETEROSCOPY AND STENT PLACEMENT;  Surgeon: Sherrilee Belvie LITTIE, MD;  Location: [facility];  Service: Urology;  Laterality: Bilateral;   CYSTOSCOPY/RETROGRADE/URETEROSCOPY/STONE EXTRACTION WITH BASKET Bilateral 03/01/2024   Procedure: CYSTOSCOPY, WITH CALCULUS REMOVAL USING BASKET;  Surgeon: Sherrilee Belvie LITTIE, MD;  Location: [facility];  Service: Urology;  Laterality: Bilateral;   CYSTOSCOPY/URETEROSCOPY/HOLMIUM LASER/STENT PLACEMENT Bilateral 03/01/2024   Procedure: BILATERAL CYSTOSCOPY/URETEROSCOPY/HOLMIUM LASER,RIGHT STENT PLACEMENT;  Surgeon: Sherrilee Belvie LITTIE, MD;  Location: [facility];  Service: Urology;  Laterality: Bilateral;   HOLMIUM LASER APPLICATION Bilateral 03/03/2023   Procedure:  HOLMIUM LASER APPLICATION;  Surgeon: Sherrilee Belvie LITTIE, MD;  Location: [facility];  Service: Urology;  Laterality: Bilateral;   HOLMIUM LASER APPLICATION Bilateral 03/01/2024   Procedure: HOLMIUM LASER APPLICATION;  Surgeon: Sherrilee Belvie LITTIE, MD;  Location: [facility];  Service: Urology;  Laterality: Bilateral;   REVERSE SHOULDER ARTHROPLASTY Left 06/07/2024   Procedure: ARTHROPLASTY, SHOULDER, TOTAL, REVERSE;  Surgeon: Onesimo Oneil LABOR, MD;  Location: [facility];  Service: Orthopedics;  Laterality: Left;   STONE EXTRACTION WITH BASKET Bilateral 03/03/2023   Procedure: STONE EXTRACTION WITH BASKET;  Surgeon: Sherrilee Belvie LITTIE, MD;  Location: [facility];  Service: Urology;  Laterality: Bilateral;   TUBAL LIGATION     Patient Active Problem List   Diagnosis Date Noted   Left rotator cuff tear 06/07/2024   Arthritis of right knee 03/20/2024   Hematuria 03/20/2024   Hypothyroidism 03/20/2024   Obesity 03/20/2024   Seasonal allergies 03/20/2024   Hypertensive disorder 11/30/2023   Former smoker 11/30/2023   Hyperlipemia 11/30/2023   Hyperlipidemia associated with type 2 diabetes mellitus (HCC) 11/30/2023   Vitamin D deficiency 11/30/2023   Nephrolithiasis 03/03/2023   Diabetes mellitus (HCC) 12/10/2015    PCP: Margarete Gibney, FNP REFERRING PROVIDER: Onesimo Oneil, MD  ONSET DATE: 06/07/24  REFERRING DIAG: L reverse total shoulder   THERAPY DIAG:  Acute pain of left shoulder  Stiffness of left shoulder, not elsewhere classified  Other symptoms and signs involving the musculoskeletal system  Rationale for Evaluation and Treatment: Rehabilitation  SUBJECTIVE:   SUBJECTIVE STATEMENT: It's feeling better Pt accompanied  by: self  PERTINENT HISTORY: 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 Yes: NPRS scale: 5/10 Pain location: anterior shoulder girdle  Pain  description: aching and heavy Aggravating factors: everything Relieving factors: tylenol   FALLS: Has patient fallen in last 6 months? Yes. Number of falls 1  PLOF: Independent  PATIENT GOALS: To get her arm use back  NEXT MD VISIT: 07/18/24  OBJECTIVE:   HAND DOMINANCE: Right  ADLs: Overall ADLs: Pt unable to reach up or behind her back, requiring mod assist for all dressing and bathing, unable to lift anything to complete cooking or cleaning. Having to sleep in a recliner - unable to sleep in bed due to pain  FUNCTIONAL OUTCOME MEASURES: Quick Dash: 79.55  UPPER EXTREMITY ROM:       Assessed in supine, er/IR adducted  Passive ROM Left eval  Shoulder flexion 103  Shoulder abduction 84  Shoulder internal rotation 90  Shoulder external rotation 30  (Blank rows = not tested)    UPPER EXTREMITY MMT:     Assessed in seated, er/IR adducted  MMT Left eval  Shoulder flexion   Shoulder abduction   Shoulder internal rotation   Shoulder external rotation   (Blank rows = not tested)  SENSATION: WFL  EDEMA: Mild swelling noted in the hand  OBSERVATIONS: Moderate fascial restriction   TODAY'S TREATMENT:                                                                                                                              DATE:   07/17/24 -Manual Therapy: myofascial release and trigger point applied to biceps, trapezius, and scapular region in order to reduce fascial restrictions and improve pain and ROM.  -P/Rom: supine - flexion, abduction, er/IR, horizontal abduction x10 -AA/ROM: supine - protraction, flexion, abduction, horizontal abduction, er/IR, x10 -Pulleys: flexion, abduction, x10 -Ball Rolls: flexion, abduction, x10  07/12/24 -Manual Therapy: myofascial release and trigger point applied to biceps, trapezius, and scapular region in order to reduce fascial restrictions and improve pain and ROM.  -P/Rom: supine - flexion, abduction, er/IR, horizontal  abduction x10 -AA/ROM: supine - protraction, flexion, abduction, horizontal abduction, er/IR, x10  07/09/24 -Manual Therapy: myofascial release and trigger point applied to biceps, trapezius, and scapular region in order to reduce fascial restrictions and improve pain and ROM.  -P/Rom: supine - flexion, abduction, er/IR, horizontal abduction x10 -Wall Washes: 2x60 -Thumb Tacs: 2x60 -Scapular ROM: retraction, elevation/depression, extension, x10 -Ball Rolls: flexion, abduction, x12 -Pulleys: flexion, abduction, x10  07/04/24 -Manual Therapy: myofascial release and trigger point applied to biceps, trapezius, and scapular region in order to reduce fascial restrictions and improve pain and ROM.  -P/Rom: supine - flexion, abduction, er/IR, horizontal abduction x10 -Wall Washes: 2x60 -Thumb Tacs: 2x45 -Scapular ROM: retraction, elevation/depression, extension, x10   PATIENT EDUCATION: Education details: AA/ROM Person educated:  Patient Education method: Explanation, Demonstration, and Handouts Education comprehension: verbalized understanding and returned demonstration  HOME EXERCISE PROGRAM: 10/31: Elbow and Wrist ROM and Pendulums 11/4: Table Slides 11/20: AA/ROM  GOALS: Goals reviewed with patient? Yes   SHORT TERM GOALS: Target date: 07/22/24  Pt will be provided with and educated on HEP to improve mobility in LUE required for use during ADL completion.   Goal status: IN PROGRESS  2.  Pt will increase LUE P/ROM by 40 degrees to improve ability to use LUE during dressing tasks with minimal compensatory techniques.   Goal status: IN PROGRESS  3.  Pt will increase LUE strength to 3+/5 to improve ability to reach for items at waist to chest height during bathing and grooming tasks.   Goal status: IN PROGRESS   LONG TERM GOALS: Target date: 08/21/24  Pt will decrease pain in LUE to 3/10 or less to improve ability to sleep for 2+ consecutive hours without waking due to  pain.   Goal status: IN PROGRESS  2.  Pt will decrease LUE fascial restrictions to min amounts or less to improve mobility required for functional reaching tasks.   Goal status: IN PROGRESS  3.  Pt will increase LUE A/ROM by 50 degrees to improve ability to use LUE when reaching overhead or behind back during dressing and bathing tasks.   Goal status: IN PROGRESS  4.  Pt will increase LUE strength to 4+/5 or greater to improve ability to use LUE when lifting or carrying items during meal preparation/housework/yardwork tasks.   Goal status: IN PROGRESS  5.  Pt will return to highest level of function using LUE as non-dominant during functional task completion.   Goal status: IN PROGRESS   ASSESSMENT:  CLINICAL IMPRESSION: Pt continues to have significant guarding and stiffness in her shoulder. She has decreased fascial restrictions, however remains tight despite manual therapy. Pt continues to work on AA/ROM which she is able to increase range, however continues to be significantly limited with poor form. OT providing verbal and tactile cuing for positioning and technique throughout session.   PERFORMANCE DEFICITS: in functional skills including in functional skills including ADLs, IADLs, coordination, tone, ROM, strength, pain, fascial restrictions, muscle spasms, and UE functional use.   PLAN:  OT FREQUENCY: 2x/week  OT DURATION: 8 weeks  PLANNED INTERVENTIONS: 97168 OT Re-evaluation, 97535 self care/ADL training, 02889 therapeutic exercise, 97530 therapeutic activity, 97112 neuromuscular re-education, 97140 manual therapy, 97035 ultrasound, 97010 moist heat, 97032 electrical stimulation (manual), passive range of motion, functional mobility training, energy conservation, coping strategies training, patient/family education, and DME and/or AE instructions  RECOMMENDED OTHER SERVICES: N/A  CONSULTED AND AGREED WITH PLAN OF CARE: Patient  PLAN FOR NEXT SESSION: Manual Therapy,  P/ROM, Pendulums, Table Slides   Valentin Nightingale, OTR/L [facility] [phone] Jaelie Aguilera Jillyn Nightingale, OT 07/18/2024, 11:58 AM

## 2024-07-18 ENCOUNTER — Encounter: Payer: Self-pay | Admitting: Orthopedic Surgery

## 2024-07-18 ENCOUNTER — Ambulatory Visit: Admitting: Orthopedic Surgery

## 2024-07-18 ENCOUNTER — Other Ambulatory Visit: Payer: Self-pay

## 2024-07-18 DIAGNOSIS — Z96612 Presence of left artificial shoulder joint: Secondary | ICD-10-CM

## 2024-07-18 DIAGNOSIS — M19012 Primary osteoarthritis, left shoulder: Secondary | ICD-10-CM

## 2024-07-18 NOTE — Progress Notes (Unsigned)
 Orthopaedic Postop Note  Assessment: Misty Cruz is a 63 y.o. female s/p Left Reverse Shoulder Arthroplasty  DOS: 06/07/2024  Plan: Sutures were trimmed, steri strips were placed Ok to remove the abduction pillow; can stop using the sling around 4 weeks postop Physical therapy prescription and protocol provided Anticipated progression discussed, XR reviewed in clinic Follow up 4 weeks   Follow-up: Return in about 6 weeks (around 08/29/2024).  XR at next visit: Left shoulder  Subjective:  Chief Complaint  Patient presents with   Post-op Follow-up    Left shoulder / improving feels better than before surgery     History of Present Illness: Misty Cruz is a 63 y.o. female who presents following the above stated procedure.  Surgery was approximately 2 weeks ago.  Sling.  She is no longer taking pain medicines.  There was some confusion with her PT order, and she has not started therapy yet.  Review of Systems: No fevers or chills No numbness or tingling No Chest Pain No shortness of breath   Objective: LMP 09/06/2010   Physical Exam:  Alert and oriented, no acute distress  Surgical incision is healing well, no surrounding erythema or drainage Sensation intact in the axillary nerve distribution Active motion intact in the hand 2+ radial pulse Sensation intact throughout the hand Tolerates gentle range of motion of the shoulder - passive forward flexion to 90, passive abduction to 90   IMAGING: I personally ordered and reviewed the following images:  XR of the Left shoulder were obtained in clinic today and demonstrate a Reverse Shoulder Arthroplasty  with implants in good position.  No evidence of acute injury or subsidence of implants.  Alignment remains unchanged compared to immediate postop XR.  Impression: Left shoulder arthroplasty in good position   Oneil DELENA Horde, MD 07/18/2024 10:00 AM

## 2024-07-23 ENCOUNTER — Ambulatory Visit (HOSPITAL_COMMUNITY): Attending: Orthopaedic Surgery | Admitting: Occupational Therapy

## 2024-07-23 ENCOUNTER — Encounter (HOSPITAL_COMMUNITY): Payer: Self-pay | Admitting: Occupational Therapy

## 2024-07-23 DIAGNOSIS — M25512 Pain in left shoulder: Secondary | ICD-10-CM | POA: Diagnosis present

## 2024-07-23 DIAGNOSIS — R29898 Other symptoms and signs involving the musculoskeletal system: Secondary | ICD-10-CM | POA: Insufficient documentation

## 2024-07-23 DIAGNOSIS — M25612 Stiffness of left shoulder, not elsewhere classified: Secondary | ICD-10-CM | POA: Diagnosis present

## 2024-07-23 NOTE — Therapy (Signed)
 OUTPATIENT OCCUPATIONAL THERAPY ORTHO TREATMENT NOTE  Patient Name: Misty Cruz MRN: [id number] DOB:1961/06/27, 63 y.o., female Today's Date: 07/23/2024   END OF SESSION:  OT End of Session - 07/23/24 1618     Visit Number 7    Number of Visits 16    Date for Recertification  08/21/24    Authorization Type [employer]    OT Start Time 1449    OT Stop Time 1530    OT Time Calculation (min) 41 min    Activity Tolerance Patient tolerated treatment well    Behavior During Therapy [facility] for tasks assessed/performed           Past Medical History:  Diagnosis Date   Anxiety    Diabetes mellitus    History of kidney stones    Hypercholesteremia    Hypertension    Hyperthyroidism    Scoliosis    Thyroid disease    Past Surgical History:  Procedure Laterality Date   BREAST BIOPSY Right 07/14/2023   MM RT BREAST BX W LOC DEV 1ST LESION IMAGE BX SPEC STEREO GUIDE 07/14/2023 [facility]   COLONOSCOPY N/A 06/04/2013   Procedure: COLONOSCOPY;  Surgeon: Margo LITTIE Haddock, MD;  [facility];  Service: Endoscopy;  Laterality: N/A;  9:30 AM   CYSTOSCOPY WITH RETROGRADE PYELOGRAM, URETEROSCOPY AND STENT PLACEMENT Bilateral 03/03/2023   Procedure: CYSTOSCOPY WITH RETROGRADE PYELOGRAM, URETEROSCOPY AND STENT PLACEMENT;  Surgeon: Sherrilee Belvie LITTIE, MD;  Location: [facility];  Service: Urology;  Laterality: Bilateral;   CYSTOSCOPY/RETROGRADE/URETEROSCOPY/STONE EXTRACTION WITH BASKET Bilateral 03/01/2024   Procedure: CYSTOSCOPY, WITH CALCULUS REMOVAL USING BASKET;  Surgeon: Sherrilee Belvie LITTIE, MD;  Location: [facility];  Service: Urology;  Laterality: Bilateral;   CYSTOSCOPY/URETEROSCOPY/HOLMIUM LASER/STENT PLACEMENT Bilateral 03/01/2024   Procedure: BILATERAL CYSTOSCOPY/URETEROSCOPY/HOLMIUM LASER,RIGHT STENT PLACEMENT;  Surgeon: Sherrilee Belvie LITTIE, MD;  Location: [facility];  Service: Urology;  Laterality: Bilateral;   HOLMIUM LASER APPLICATION Bilateral 03/03/2023   Procedure:  HOLMIUM LASER APPLICATION;  Surgeon: Sherrilee Belvie LITTIE, MD;  Location: [facility];  Service: Urology;  Laterality: Bilateral;   HOLMIUM LASER APPLICATION Bilateral 03/01/2024   Procedure: HOLMIUM LASER APPLICATION;  Surgeon: Sherrilee Belvie LITTIE, MD;  Location: [facility];  Service: Urology;  Laterality: Bilateral;   REVERSE SHOULDER ARTHROPLASTY Left 06/07/2024   Procedure: ARTHROPLASTY, SHOULDER, TOTAL, REVERSE;  Surgeon: Onesimo Oneil LABOR, MD;  Location: [facility];  Service: Orthopedics;  Laterality: Left;   STONE EXTRACTION WITH BASKET Bilateral 03/03/2023   Procedure: STONE EXTRACTION WITH BASKET;  Surgeon: Sherrilee Belvie LITTIE, MD;  Location: [facility];  Service: Urology;  Laterality: Bilateral;   TUBAL LIGATION     Patient Active Problem List   Diagnosis Date Noted   Left rotator cuff tear 06/07/2024   Arthritis of right knee 03/20/2024   Hematuria 03/20/2024   Hypothyroidism 03/20/2024   Obesity 03/20/2024   Seasonal allergies 03/20/2024   Hypertensive disorder 11/30/2023   Former smoker 11/30/2023   Hyperlipemia 11/30/2023   Hyperlipidemia associated with type 2 diabetes mellitus (HCC) 11/30/2023   Vitamin D deficiency 11/30/2023   Nephrolithiasis 03/03/2023   Diabetes mellitus (HCC) 12/10/2015    PCP: Margarete Gibney, FNP REFERRING PROVIDER: Onesimo Oneil, MD  ONSET DATE: 06/07/24  REFERRING DIAG: L reverse total shoulder   THERAPY DIAG:  Acute pain of left shoulder  Stiffness of left shoulder, not elsewhere classified  Other symptoms and signs involving the musculoskeletal system  Rationale for Evaluation and Treatment: Rehabilitation  SUBJECTIVE:   SUBJECTIVE STATEMENT: It's feeling better Pt  accompanied by: self  PERTINENT HISTORY: 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 Yes: NPRS scale: 5/10 Pain location: anterior shoulder girdle  Pain  description: aching and heavy Aggravating factors: everything Relieving factors: tylenol   FALLS: Has patient fallen in last 6 months? Yes. Number of falls 1  PLOF: Independent  PATIENT GOALS: To get her arm use back  NEXT MD VISIT: 07/18/24  OBJECTIVE:   HAND DOMINANCE: Right  ADLs: Overall ADLs: Pt unable to reach up or behind her back, requiring mod assist for all dressing and bathing, unable to lift anything to complete cooking or cleaning. Having to sleep in a recliner - unable to sleep in bed due to pain  FUNCTIONAL OUTCOME MEASURES: Quick Dash: 79.55  UPPER EXTREMITY ROM:       Assessed in supine, er/IR adducted  Passive ROM Left eval  Shoulder flexion 103  Shoulder abduction 84  Shoulder internal rotation 90  Shoulder external rotation 30  (Blank rows = not tested)    UPPER EXTREMITY MMT:     Assessed in seated, er/IR adducted  MMT Left eval  Shoulder flexion   Shoulder abduction   Shoulder internal rotation   Shoulder external rotation   (Blank rows = not tested)  SENSATION: WFL  EDEMA: Mild swelling noted in the hand  OBSERVATIONS: Moderate fascial restriction   TODAY'S TREATMENT:                                                                                                                              DATE:   07/23/24 -Manual Therapy: myofascial release and trigger point applied to biceps, trapezius, and scapular region in order to reduce fascial restrictions and improve pain and ROM.  -P/Rom: supine - flexion, abduction, er/IR, horizontal abduction x10 -AA/ROM: supine - protraction, flexion, abduction, horizontal abduction, er/IR, x10 -Forward reaching, sitting to tap counter, x10 -Wall Wash x60  07/17/24 -Manual Therapy: myofascial release and trigger point applied to biceps, trapezius, and scapular region in order to reduce fascial restrictions and improve pain and ROM.  -P/Rom: supine - flexion, abduction, er/IR, horizontal abduction  x10 -AA/ROM: supine - protraction, flexion, abduction, horizontal abduction, er/IR, x10 -Pulleys: flexion, abduction, x10 -Ball Rolls: flexion, abduction, x10  07/12/24 -Manual Therapy: myofascial release and trigger point applied to biceps, trapezius, and scapular region in order to reduce fascial restrictions and improve pain and ROM.  -P/Rom: supine - flexion, abduction, er/IR, horizontal abduction x10 -AA/ROM: supine - protraction, flexion, abduction, horizontal abduction, er/IR, x10   PATIENT EDUCATION: Education details: forward reaching for elbow extension Person educated: Patient Education method: Explanation, Demonstration, and Handouts Education comprehension: verbalized understanding and returned demonstration  HOME EXERCISE PROGRAM: 10/31: Elbow and Wrist ROM and Pendulums 11/4: Table Slides 11/20: AA/ROM 12/1: forward reaching for elbow extension  GOALS: Goals reviewed with patient? Yes  SHORT TERM GOALS: Target date: 07/22/24  Pt will be provided with and educated on HEP to improve mobility in LUE required for use during ADL completion.   Goal status: IN PROGRESS  2.  Pt will increase LUE P/ROM by 40 degrees to improve ability to use LUE during dressing tasks with minimal compensatory techniques.   Goal status: IN PROGRESS  3.  Pt will increase LUE strength to 3+/5 to improve ability to reach for items at waist to chest height during bathing and grooming tasks.   Goal status: IN PROGRESS   LONG TERM GOALS: Target date: 08/21/24  Pt will decrease pain in LUE to 3/10 or less to improve ability to sleep for 2+ consecutive hours without waking due to pain.   Goal status: IN PROGRESS  2.  Pt will decrease LUE fascial restrictions to min amounts or less to improve mobility required for functional reaching tasks.   Goal status: IN PROGRESS  3.  Pt will increase LUE A/ROM by 50 degrees to improve ability to use LUE when reaching overhead or behind back  during dressing and bathing tasks.   Goal status: IN PROGRESS  4.  Pt will increase LUE strength to 4+/5 or greater to improve ability to use LUE when lifting or carrying items during meal preparation/housework/yardwork tasks.   Goal status: IN PROGRESS  5.  Pt will return to highest level of function using LUE as non-dominant during functional task completion.   Goal status: IN PROGRESS   ASSESSMENT:  CLINICAL IMPRESSION: This session pt continues to have increased stiffness. She struggles with low level reaching and utilizes her whole body, instead of just her elbow to reach forward to touch the door or the counter. OT providing hands on assist with AA/ROM due to difficulty with technique. Verbal and tactile cuing provided for positioning and technique throughout session.   PERFORMANCE DEFICITS: in functional skills including in functional skills including ADLs, IADLs, coordination, tone, ROM, strength, pain, fascial restrictions, muscle spasms, and UE functional use.   PLAN:  OT FREQUENCY: 2x/week  OT DURATION: 8 weeks  PLANNED INTERVENTIONS: 97168 OT Re-evaluation, 97535 self care/ADL training, 02889 therapeutic exercise, 97530 therapeutic activity, 97112 neuromuscular re-education, 97140 manual therapy, 97035 ultrasound, 97010 moist heat, 97032 electrical stimulation (manual), passive range of motion, functional mobility training, energy conservation, coping strategies training, patient/family education, and DME and/or AE instructions  RECOMMENDED OTHER SERVICES: N/A  CONSULTED AND AGREED WITH PLAN OF CARE: Patient  PLAN FOR NEXT SESSION: Manual Therapy, P/ROM, Pendulums, Table Slides   Valentin Nightingale, OTR/L [facility] [phone] Woodley Petzold Jillyn Nightingale, OT 07/23/2024, 4:21 PM

## 2024-07-26 ENCOUNTER — Ambulatory Visit (HOSPITAL_COMMUNITY): Admitting: Occupational Therapy

## 2024-07-26 ENCOUNTER — Encounter (HOSPITAL_COMMUNITY): Payer: Self-pay | Admitting: Occupational Therapy

## 2024-07-26 DIAGNOSIS — M25612 Stiffness of left shoulder, not elsewhere classified: Secondary | ICD-10-CM

## 2024-07-26 DIAGNOSIS — R29898 Other symptoms and signs involving the musculoskeletal system: Secondary | ICD-10-CM

## 2024-07-26 DIAGNOSIS — M25512 Pain in left shoulder: Secondary | ICD-10-CM | POA: Diagnosis not present

## 2024-07-26 NOTE — Therapy (Signed)
 OUTPATIENT OCCUPATIONAL THERAPY ORTHO TREATMENT NOTE  Patient Name: Misty Cruz MRN: [id number] DOB:Oct 24, 1960, 63 y.o., female Today's Date: 07/27/2024   END OF SESSION:  OT End of Session - 07/26/24 1433     Visit Number 8    Number of Visits 16    Date for Recertification  08/21/24    Authorization Type [employer]    OT Start Time 1349    OT Stop Time 1433    OT Time Calculation (min) 44 min    Activity Tolerance Patient tolerated treatment well    Behavior During Therapy [facility] for tasks assessed/performed          Past Medical History:  Diagnosis Date   Anxiety    Diabetes mellitus    History of kidney stones    Hypercholesteremia    Hypertension    Hyperthyroidism    Scoliosis    Thyroid disease    Past Surgical History:  Procedure Laterality Date   BREAST BIOPSY Right 07/14/2023   MM RT BREAST BX W LOC DEV 1ST LESION IMAGE BX SPEC STEREO GUIDE 07/14/2023 [facility]   COLONOSCOPY N/A 06/04/2013   Procedure: COLONOSCOPY;  Surgeon: Margo LITTIE Haddock, MD;  [facility];  Service: Endoscopy;  Laterality: N/A;  9:30 AM   CYSTOSCOPY WITH RETROGRADE PYELOGRAM, URETEROSCOPY AND STENT PLACEMENT Bilateral 03/03/2023   Procedure: CYSTOSCOPY WITH RETROGRADE PYELOGRAM, URETEROSCOPY AND STENT PLACEMENT;  Surgeon: Sherrilee Belvie LITTIE, MD;  Location: [facility];  Service: Urology;  Laterality: Bilateral;   CYSTOSCOPY/RETROGRADE/URETEROSCOPY/STONE EXTRACTION WITH BASKET Bilateral 03/01/2024   Procedure: CYSTOSCOPY, WITH CALCULUS REMOVAL USING BASKET;  Surgeon: Sherrilee Belvie LITTIE, MD;  Location: [facility];  Service: Urology;  Laterality: Bilateral;   CYSTOSCOPY/URETEROSCOPY/HOLMIUM LASER/STENT PLACEMENT Bilateral 03/01/2024   Procedure: BILATERAL CYSTOSCOPY/URETEROSCOPY/HOLMIUM LASER,RIGHT STENT PLACEMENT;  Surgeon: Sherrilee Belvie LITTIE, MD;  Location: [facility];  Service: Urology;  Laterality: Bilateral;   HOLMIUM LASER APPLICATION Bilateral 03/03/2023   Procedure:  HOLMIUM LASER APPLICATION;  Surgeon: Sherrilee Belvie LITTIE, MD;  Location: [facility];  Service: Urology;  Laterality: Bilateral;   HOLMIUM LASER APPLICATION Bilateral 03/01/2024   Procedure: HOLMIUM LASER APPLICATION;  Surgeon: Sherrilee Belvie LITTIE, MD;  Location: [facility];  Service: Urology;  Laterality: Bilateral;   REVERSE SHOULDER ARTHROPLASTY Left 06/07/2024   Procedure: ARTHROPLASTY, SHOULDER, TOTAL, REVERSE;  Surgeon: Onesimo Oneil LABOR, MD;  Location: [facility];  Service: Orthopedics;  Laterality: Left;   STONE EXTRACTION WITH BASKET Bilateral 03/03/2023   Procedure: STONE EXTRACTION WITH BASKET;  Surgeon: Sherrilee Belvie LITTIE, MD;  Location: [facility];  Service: Urology;  Laterality: Bilateral;   TUBAL LIGATION     Patient Active Problem List   Diagnosis Date Noted   Left rotator cuff tear 06/07/2024   Arthritis of right knee 03/20/2024   Hematuria 03/20/2024   Hypothyroidism 03/20/2024   Obesity 03/20/2024   Seasonal allergies 03/20/2024   Hypertensive disorder 11/30/2023   Former smoker 11/30/2023   Hyperlipemia 11/30/2023   Hyperlipidemia associated with type 2 diabetes mellitus (HCC) 11/30/2023   Vitamin D deficiency 11/30/2023   Nephrolithiasis 03/03/2023   Diabetes mellitus (HCC) 12/10/2015    PCP: Margarete Gibney, FNP REFERRING PROVIDER: Onesimo Oneil, MD  ONSET DATE: 06/07/24  REFERRING DIAG: L reverse total shoulder   THERAPY DIAG:  Acute pain of left shoulder  Stiffness of left shoulder, not elsewhere classified  Other symptoms and signs involving the musculoskeletal system  Rationale for Evaluation and Treatment: Rehabilitation  SUBJECTIVE:   SUBJECTIVE STATEMENT: It's feeling better Pt accompanied  by: self  PERTINENT HISTORY: 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 Yes: NPRS scale: 5/10 Pain location: anterior shoulder girdle  Pain  description: aching and heavy Aggravating factors: everything Relieving factors: tylenol   FALLS: Has patient fallen in last 6 months? Yes. Number of falls 1  PLOF: Independent  PATIENT GOALS: To get her arm use back  NEXT MD VISIT: 07/18/24  OBJECTIVE:   HAND DOMINANCE: Right  ADLs: Overall ADLs: Pt unable to reach up or behind her back, requiring mod assist for all dressing and bathing, unable to lift anything to complete cooking or cleaning. Having to sleep in a recliner - unable to sleep in bed due to pain  FUNCTIONAL OUTCOME MEASURES: Quick Dash: 79.55  UPPER EXTREMITY ROM:       Assessed in supine, er/IR adducted  Passive ROM Left eval  Shoulder flexion 103  Shoulder abduction 84  Shoulder internal rotation 90  Shoulder external rotation 30  (Blank rows = not tested)    UPPER EXTREMITY MMT:     Assessed in seated, er/IR adducted  MMT Left eval  Shoulder flexion   Shoulder abduction   Shoulder internal rotation   Shoulder external rotation   (Blank rows = not tested)  SENSATION: WFL  EDEMA: Mild swelling noted in the hand  OBSERVATIONS: Moderate fascial restriction   TODAY'S TREATMENT:                                                                                                                              DATE:   07/26/24 -Manual Therapy: myofascial release and trigger point applied to biceps, trapezius, and scapular region in order to reduce fascial restrictions and improve pain and ROM.  -P/Rom: supine - flexion, abduction, er/IR, horizontal abduction x10 -AA/ROM: supine - protraction, flexion, abduction, horizontal abduction, er/IR, x12 -A/ROM: supine - protraction, flexion, min assist past 90* at elbow  07/23/24 -Manual Therapy: myofascial release and trigger point applied to biceps, trapezius, and scapular region in order to reduce fascial restrictions and improve pain and ROM.  -P/Rom: supine - flexion, abduction, er/IR, horizontal  abduction x10 -AA/ROM: supine - protraction, flexion, abduction, horizontal abduction, er/IR, x10 -Forward reaching, sitting to tap counter, x10 -Wall Wash x60  07/17/24 -Manual Therapy: myofascial release and trigger point applied to biceps, trapezius, and scapular region in order to reduce fascial restrictions and improve pain and ROM.  -P/Rom: supine - flexion, abduction, er/IR, horizontal abduction x10 -AA/ROM: supine - protraction, flexion, abduction, horizontal abduction, er/IR, x10 -Pulleys: flexion, abduction, x10 -Ball Rolls: flexion, abduction, x10   PATIENT EDUCATION: Education details: forward reaching for elbow extension Person educated: Patient Education method: Explanation, Demonstration, and Handouts Education comprehension: verbalized understanding and returned demonstration  HOME EXERCISE PROGRAM: 10/31: Elbow and Wrist ROM and Pendulums 11/4: Table Slides 11/20: AA/ROM 12/1: forward reaching for elbow  extension  GOALS: Goals reviewed with patient? Yes   SHORT TERM GOALS: Target date: 07/22/24  Pt will be provided with and educated on HEP to improve mobility in LUE required for use during ADL completion.   Goal status: IN PROGRESS  2.  Pt will increase LUE P/ROM by 40 degrees to improve ability to use LUE during dressing tasks with minimal compensatory techniques.   Goal status: IN PROGRESS  3.  Pt will increase LUE strength to 3+/5 to improve ability to reach for items at waist to chest height during bathing and grooming tasks.   Goal status: IN PROGRESS   LONG TERM GOALS: Target date: 08/21/24  Pt will decrease pain in LUE to 3/10 or less to improve ability to sleep for 2+ consecutive hours without waking due to pain.   Goal status: IN PROGRESS  2.  Pt will decrease LUE fascial restrictions to min amounts or less to improve mobility required for functional reaching tasks.   Goal status: IN PROGRESS  3.  Pt will increase LUE A/ROM by 50  degrees to improve ability to use LUE when reaching overhead or behind back during dressing and bathing tasks.   Goal status: IN PROGRESS  4.  Pt will increase LUE strength to 4+/5 or greater to improve ability to use LUE when lifting or carrying items during meal preparation/housework/yardwork tasks.   Goal status: IN PROGRESS  5.  Pt will return to highest level of function using LUE as non-dominant during functional task completion.   Goal status: IN PROGRESS   ASSESSMENT:  CLINICAL IMPRESSION: Pt continues to have minimal pain, however she continues to struggle with active mobility. She requires hands on assist and continuous tactile cuing due to poor follow through with visual demonstration. Her active ROM continues to be limited to less than 90 degrees, requiring min assist to push further towards her end ranges. Verbal and tactile cuing provided constantly throughout session.   PERFORMANCE DEFICITS: in functional skills including in functional skills including ADLs, IADLs, coordination, tone, ROM, strength, pain, fascial restrictions, muscle spasms, and UE functional use.   PLAN:  OT FREQUENCY: 2x/week  OT DURATION: 8 weeks  PLANNED INTERVENTIONS: 97168 OT Re-evaluation, 97535 self care/ADL training, 02889 therapeutic exercise, 97530 therapeutic activity, 97112 neuromuscular re-education, 97140 manual therapy, 97035 ultrasound, 97010 moist heat, 97032 electrical stimulation (manual), passive range of motion, functional mobility training, energy conservation, coping strategies training, patient/family education, and DME and/or AE instructions  RECOMMENDED OTHER SERVICES: N/A  CONSULTED AND AGREED WITH PLAN OF CARE: Patient  PLAN FOR NEXT SESSION: Manual Therapy, P/ROM, Pendulums, Table Slides   Valentin Nightingale, OTR/L [facility] [phone] Demauri Advincula Jillyn Nightingale, OT 07/27/2024, 11:23 AM

## 2024-07-30 ENCOUNTER — Ambulatory Visit (HOSPITAL_COMMUNITY): Admitting: Occupational Therapy

## 2024-08-02 ENCOUNTER — Ambulatory Visit (HOSPITAL_COMMUNITY): Admitting: Occupational Therapy

## 2024-08-02 ENCOUNTER — Encounter (HOSPITAL_COMMUNITY): Payer: Self-pay | Admitting: Occupational Therapy

## 2024-08-02 DIAGNOSIS — M25512 Pain in left shoulder: Secondary | ICD-10-CM | POA: Diagnosis not present

## 2024-08-02 DIAGNOSIS — M25612 Stiffness of left shoulder, not elsewhere classified: Secondary | ICD-10-CM

## 2024-08-02 DIAGNOSIS — R29898 Other symptoms and signs involving the musculoskeletal system: Secondary | ICD-10-CM

## 2024-08-02 NOTE — Therapy (Signed)
 OUTPATIENT OCCUPATIONAL THERAPY ORTHO TREATMENT NOTE  Patient Name: Misty Cruz MRN: [id number] DOB:07-11-1961, 63 y.o., female Today's Date: 08/02/2024   END OF SESSION:  OT End of Session - 08/02/24 1628     Visit Number 9    Number of Visits 16    Date for Recertification  08/21/24    Authorization Type [employer]    OT Start Time 1312    OT Stop Time 1351    OT Time Calculation (min) 39 min    Activity Tolerance Patient tolerated treatment well    Behavior During Therapy [facility] for tasks assessed/performed           Past Medical History:  Diagnosis Date   Anxiety    Diabetes mellitus    History of kidney stones    Hypercholesteremia    Hypertension    Hyperthyroidism    Scoliosis    Thyroid disease    Past Surgical History:  Procedure Laterality Date   BREAST BIOPSY Right 07/14/2023   MM RT BREAST BX W LOC DEV 1ST LESION IMAGE BX SPEC STEREO GUIDE 07/14/2023 [facility]   COLONOSCOPY N/A 06/04/2013   Procedure: COLONOSCOPY;  Surgeon: Margo LITTIE Haddock, MD;  [facility];  Service: Endoscopy;  Laterality: N/A;  9:30 AM   CYSTOSCOPY WITH RETROGRADE PYELOGRAM, URETEROSCOPY AND STENT PLACEMENT Bilateral 03/03/2023   Procedure: CYSTOSCOPY WITH RETROGRADE PYELOGRAM, URETEROSCOPY AND STENT PLACEMENT;  Surgeon: Sherrilee Belvie LITTIE, MD;  Location: [facility];  Service: Urology;  Laterality: Bilateral;   CYSTOSCOPY/RETROGRADE/URETEROSCOPY/STONE EXTRACTION WITH BASKET Bilateral 03/01/2024   Procedure: CYSTOSCOPY, WITH CALCULUS REMOVAL USING BASKET;  Surgeon: Sherrilee Belvie LITTIE, MD;  Location: [facility];  Service: Urology;  Laterality: Bilateral;   CYSTOSCOPY/URETEROSCOPY/HOLMIUM LASER/STENT PLACEMENT Bilateral 03/01/2024   Procedure: BILATERAL CYSTOSCOPY/URETEROSCOPY/HOLMIUM LASER,RIGHT STENT PLACEMENT;  Surgeon: Sherrilee Belvie LITTIE, MD;  Location: [facility];  Service: Urology;  Laterality: Bilateral;   HOLMIUM LASER APPLICATION Bilateral 03/03/2023    Procedure: HOLMIUM LASER APPLICATION;  Surgeon: Sherrilee Belvie LITTIE, MD;  Location: [facility];  Service: Urology;  Laterality: Bilateral;   HOLMIUM LASER APPLICATION Bilateral 03/01/2024   Procedure: HOLMIUM LASER APPLICATION;  Surgeon: Sherrilee Belvie LITTIE, MD;  Location: [facility];  Service: Urology;  Laterality: Bilateral;   REVERSE SHOULDER ARTHROPLASTY Left 06/07/2024   Procedure: ARTHROPLASTY, SHOULDER, TOTAL, REVERSE;  Surgeon: Onesimo Oneil LABOR, MD;  Location: [facility];  Service: Orthopedics;  Laterality: Left;   STONE EXTRACTION WITH BASKET Bilateral 03/03/2023   Procedure: STONE EXTRACTION WITH BASKET;  Surgeon: Sherrilee Belvie LITTIE, MD;  Location: [facility];  Service: Urology;  Laterality: Bilateral;   TUBAL LIGATION     Patient Active Problem List   Diagnosis Date Noted   Left rotator cuff tear 06/07/2024   Arthritis of right knee 03/20/2024   Hematuria 03/20/2024   Hypothyroidism 03/20/2024   Obesity 03/20/2024   Seasonal allergies 03/20/2024   Hypertensive disorder 11/30/2023   Former smoker 11/30/2023   Hyperlipemia 11/30/2023   Hyperlipidemia associated with type 2 diabetes mellitus (HCC) 11/30/2023   Vitamin D deficiency 11/30/2023   Nephrolithiasis 03/03/2023   Diabetes mellitus (HCC) 12/10/2015    PCP: Margarete Gibney, FNP REFERRING PROVIDER: Onesimo Oneil, MD  ONSET DATE: 06/07/24  REFERRING DIAG: L reverse total shoulder   THERAPY DIAG:  Stiffness of left shoulder, not elsewhere classified  Acute pain of left shoulder  Other symptoms and signs involving the musculoskeletal system  Rationale for Evaluation and Treatment: Rehabilitation  SUBJECTIVE:   SUBJECTIVE STATEMENT: It's feeling better Pt  accompanied by: self  PERTINENT HISTORY: 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 Yes: NPRS scale: 5/10 Pain location: anterior shoulder  girdle  Pain description: aching and heavy Aggravating factors: everything Relieving factors: tylenol   FALLS: Has patient fallen in last 6 months? Yes. Number of falls 1  PLOF: Independent  PATIENT GOALS: To get her arm use back  NEXT MD VISIT: 07/18/24  OBJECTIVE:   HAND DOMINANCE: Right  ADLs: Overall ADLs: Pt unable to reach up or behind her back, requiring mod assist for all dressing and bathing, unable to lift anything to complete cooking or cleaning. Having to sleep in a recliner - unable to sleep in bed due to pain  FUNCTIONAL OUTCOME MEASURES: Quick Dash: 79.55  UPPER EXTREMITY ROM:       Assessed in supine, er/IR adducted  Passive ROM Left eval  Shoulder flexion 103  Shoulder abduction 84  Shoulder internal rotation 90  Shoulder external rotation 30  (Blank rows = not tested)    UPPER EXTREMITY MMT:     Assessed in seated, er/IR adducted  MMT Left eval  Shoulder flexion   Shoulder abduction   Shoulder internal rotation   Shoulder external rotation   (Blank rows = not tested)  SENSATION: WFL  EDEMA: Mild swelling noted in the hand  OBSERVATIONS: Moderate fascial restriction   TODAY'S TREATMENT:                                                                                                                              DATE:   08/02/24 -Manual Therapy: myofascial release and trigger point applied to biceps, trapezius, and scapular region in order to reduce fascial restrictions and improve pain and ROM.  -AA/ROM: supine - protraction, flexion, abduction, horizontal abduction, er/IR, x12 -A/ROM: supine - protraction, flexion, abduction, horizontal abduction, er/IR, x10 - mod hands on assist for form -Pulleys: flexion, abduction, x10  07/26/24 -Manual Therapy: myofascial release and trigger point applied to biceps, trapezius, and scapular region in order to reduce fascial restrictions and improve pain and ROM.  -P/Rom: supine - flexion, abduction,  er/IR, horizontal abduction x10 -AA/ROM: supine - protraction, flexion, abduction, horizontal abduction, er/IR, x12 -A/ROM: supine - protraction, flexion, min assist past 90* at elbow  07/23/24 -Manual Therapy: myofascial release and trigger point applied to biceps, trapezius, and scapular region in order to reduce fascial restrictions and improve pain and ROM.  -P/Rom: supine - flexion, abduction, er/IR, horizontal abduction x10 -AA/ROM: supine - protraction, flexion, abduction, horizontal abduction, er/IR, x10 -Forward reaching, sitting to tap counter, x10 -Wall Wash x60  07/17/24 -Manual Therapy: myofascial release and trigger point applied to biceps, trapezius, and scapular region in order to reduce fascial restrictions and improve pain and ROM.  -P/Rom: supine - flexion, abduction, er/IR, horizontal abduction x10 -AA/ROM: supine - protraction, flexion, abduction, horizontal  abduction, er/IR, x10 -Pulleys: flexion, abduction, x10 -Ball Rolls: flexion, abduction, x10   PATIENT EDUCATION: Education details: forward reaching for elbow extension Person educated: Patient Education method: Explanation, Demonstration, and Handouts Education comprehension: verbalized understanding and returned demonstration  HOME EXERCISE PROGRAM: 10/31: Elbow and Wrist ROM and Pendulums 11/4: Table Slides 11/20: AA/ROM 12/1: forward reaching for elbow extension  GOALS: Goals reviewed with patient? Yes   SHORT TERM GOALS: Target date: 07/22/24  Pt will be provided with and educated on HEP to improve mobility in LUE required for use during ADL completion.   Goal status: IN PROGRESS  2.  Pt will increase LUE P/ROM by 40 degrees to improve ability to use LUE during dressing tasks with minimal compensatory techniques.   Goal status: IN PROGRESS  3.  Pt will increase LUE strength to 3+/5 to improve ability to reach for items at waist to chest height during bathing and grooming tasks.   Goal  status: IN PROGRESS   LONG TERM GOALS: Target date: 08/21/24  Pt will decrease pain in LUE to 3/10 or less to improve ability to sleep for 2+ consecutive hours without waking due to pain.   Goal status: IN PROGRESS  2.  Pt will decrease LUE fascial restrictions to min amounts or less to improve mobility required for functional reaching tasks.   Goal status: IN PROGRESS  3.  Pt will increase LUE A/ROM by 50 degrees to improve ability to use LUE when reaching overhead or behind back during dressing and bathing tasks.   Goal status: IN PROGRESS  4.  Pt will increase LUE strength to 4+/5 or greater to improve ability to use LUE when lifting or carrying items during meal preparation/housework/yardwork tasks.   Goal status: IN PROGRESS  5.  Pt will return to highest level of function using LUE as non-dominant during functional task completion.   Goal status: IN PROGRESS   ASSESSMENT:  CLINICAL IMPRESSION: This session pt continued to struggle with active movements. OT providing hands on assist throughout session to improve her form and encourage reaching her end range potential. Without assist pt was unable to achieve 50% of any motion, however with hands on assist she could achieve 80-90% of full ROM. OT also providing verbal and visual cuing for positioning and technique.   PERFORMANCE DEFICITS: in functional skills including in functional skills including ADLs, IADLs, coordination, tone, ROM, strength, pain, fascial restrictions, muscle spasms, and UE functional use.   PLAN:  OT FREQUENCY: 2x/week  OT DURATION: 8 weeks  PLANNED INTERVENTIONS: 97168 OT Re-evaluation, 97535 self care/ADL training, 02889 therapeutic exercise, 97530 therapeutic activity, 97112 neuromuscular re-education, 97140 manual therapy, 97035 ultrasound, 97010 moist heat, 97032 electrical stimulation (manual), passive range of motion, functional mobility training, energy conservation, coping strategies  training, patient/family education, and DME and/or AE instructions  RECOMMENDED OTHER SERVICES: N/A  CONSULTED AND AGREED WITH PLAN OF CARE: Patient  PLAN FOR NEXT SESSION: Manual Therapy, P/ROM, Pendulums, Table Slides   Valentin Nightingale, OTR/L [facility] [phone] Argel Pablo Jillyn Nightingale, OT 08/02/2024, 4:29 PM

## 2024-08-06 ENCOUNTER — Ambulatory Visit (HOSPITAL_COMMUNITY): Admitting: Occupational Therapy

## 2024-08-06 ENCOUNTER — Encounter (HOSPITAL_COMMUNITY): Payer: Self-pay | Admitting: Occupational Therapy

## 2024-08-06 DIAGNOSIS — M25612 Stiffness of left shoulder, not elsewhere classified: Secondary | ICD-10-CM

## 2024-08-06 DIAGNOSIS — M25512 Pain in left shoulder: Secondary | ICD-10-CM | POA: Diagnosis not present

## 2024-08-06 DIAGNOSIS — R29898 Other symptoms and signs involving the musculoskeletal system: Secondary | ICD-10-CM

## 2024-08-06 NOTE — Therapy (Unsigned)
 OUTPATIENT OCCUPATIONAL THERAPY ORTHO TREATMENT NOTE  Patient Name: CARLOS QUACKENBUSH MRN: [id number] DOB:1961/03/08, 63 y.o., female Today's Date: 08/07/2024   END OF SESSION:    08/06/24 1200  OT Visits / Re-Eval  Visit Number 10  Number of Visits 16  Date for Recertification  08/21/24  Authorization  Authorization Type [employer]  OT Time Calculation  OT Start Time 1119  OT Stop Time 1200  OT Time Calculation (min) 41 min  End of Session  Activity Tolerance Patient tolerated treatment well  Behavior During Therapy WFL for tasks assessed/performed     Past Medical History:  Diagnosis Date   Anxiety    Diabetes mellitus    History of kidney stones    Hypercholesteremia    Hypertension    Hyperthyroidism    Scoliosis    Thyroid disease    Past Surgical History:  Procedure Laterality Date   BREAST BIOPSY Right 07/14/2023   MM RT BREAST BX W LOC DEV 1ST LESION IMAGE BX SPEC STEREO GUIDE 07/14/2023 [facility]   COLONOSCOPY N/A 06/04/2013   Procedure: COLONOSCOPY;  Surgeon: Margo LITTIE Haddock, MD;  [facility];  Service: Endoscopy;  Laterality: N/A;  9:30 AM   CYSTOSCOPY WITH RETROGRADE PYELOGRAM, URETEROSCOPY AND STENT PLACEMENT Bilateral 03/03/2023   Procedure: CYSTOSCOPY WITH RETROGRADE PYELOGRAM, URETEROSCOPY AND STENT PLACEMENT;  Surgeon: Sherrilee Belvie LITTIE, MD;  Location: [facility];  Service: Urology;  Laterality: Bilateral;   CYSTOSCOPY/RETROGRADE/URETEROSCOPY/STONE EXTRACTION WITH BASKET Bilateral 03/01/2024   Procedure: CYSTOSCOPY, WITH CALCULUS REMOVAL USING BASKET;  Surgeon: Sherrilee Belvie LITTIE, MD;  Location: [facility];  Service: Urology;  Laterality: Bilateral;   CYSTOSCOPY/URETEROSCOPY/HOLMIUM LASER/STENT PLACEMENT Bilateral 03/01/2024   Procedure: BILATERAL CYSTOSCOPY/URETEROSCOPY/HOLMIUM LASER,RIGHT STENT PLACEMENT;  Surgeon: Sherrilee Belvie LITTIE, MD;  Location: [facility];  Service: Urology;  Laterality: Bilateral;   HOLMIUM LASER APPLICATION  Bilateral 03/03/2023   Procedure: HOLMIUM LASER APPLICATION;  Surgeon: Sherrilee Belvie LITTIE, MD;  Location: [facility];  Service: Urology;  Laterality: Bilateral;   HOLMIUM LASER APPLICATION Bilateral 03/01/2024   Procedure: HOLMIUM LASER APPLICATION;  Surgeon: Sherrilee Belvie LITTIE, MD;  Location: [facility];  Service: Urology;  Laterality: Bilateral;   REVERSE SHOULDER ARTHROPLASTY Left 06/07/2024   Procedure: ARTHROPLASTY, SHOULDER, TOTAL, REVERSE;  Surgeon: Onesimo Oneil LABOR, MD;  Location: [facility];  Service: Orthopedics;  Laterality: Left;   STONE EXTRACTION WITH BASKET Bilateral 03/03/2023   Procedure: STONE EXTRACTION WITH BASKET;  Surgeon: Sherrilee Belvie LITTIE, MD;  Location: [facility];  Service: Urology;  Laterality: Bilateral;   TUBAL LIGATION     Patient Active Problem List   Diagnosis Date Noted   Left rotator cuff tear 06/07/2024   Arthritis of right knee 03/20/2024   Hematuria 03/20/2024   Hypothyroidism 03/20/2024   Obesity 03/20/2024   Seasonal allergies 03/20/2024   Hypertensive disorder 11/30/2023   Former smoker 11/30/2023   Hyperlipemia 11/30/2023   Hyperlipidemia associated with type 2 diabetes mellitus (HCC) 11/30/2023   Vitamin D deficiency 11/30/2023   Nephrolithiasis 03/03/2023   Diabetes mellitus (HCC) 12/10/2015    PCP: Margarete Gibney, FNP REFERRING PROVIDER: Onesimo Oneil, MD  ONSET DATE: 06/07/24  REFERRING DIAG: L reverse total shoulder   THERAPY DIAG:  Stiffness of left shoulder, not elsewhere classified  Acute pain of left shoulder  Other symptoms and signs involving the musculoskeletal system  Rationale for Evaluation and Treatment: Rehabilitation  SUBJECTIVE:   SUBJECTIVE STATEMENT: It's feeling better Pt accompanied by: self  PERTINENT HISTORY: 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 Yes: NPRS scale: 5/10 Pain location:  anterior shoulder girdle  Pain description: aching and heavy Aggravating factors: everything Relieving factors: tylenol   FALLS: Has patient fallen in last 6 months? Yes. Number of falls 1  PLOF: Independent  PATIENT GOALS: To get her arm use back  NEXT MD VISIT: 07/18/24  OBJECTIVE:   HAND DOMINANCE: Right  ADLs: Overall ADLs: Pt unable to reach up or behind her back, requiring mod assist for all dressing and bathing, unable to lift anything to complete cooking or cleaning. Having to sleep in a recliner - unable to sleep in bed due to pain  FUNCTIONAL OUTCOME MEASURES: Quick Dash: 79.55  UPPER EXTREMITY ROM:       Assessed in supine, er/IR adducted  Passive ROM Left eval  Shoulder flexion 103  Shoulder abduction 84  Shoulder internal rotation 90  Shoulder external rotation 30  (Blank rows = not tested)    UPPER EXTREMITY MMT:     Assessed in seated, er/IR adducted  MMT Left eval  Shoulder flexion   Shoulder abduction   Shoulder internal rotation   Shoulder external rotation   (Blank rows = not tested)  SENSATION: WFL  EDEMA: Mild swelling noted in the hand  OBSERVATIONS: Moderate fascial restriction   TODAY'S TREATMENT:                                                                                                                              DATE:   08/06/24 -Manual Therapy: myofascial release and trigger point applied to biceps, trapezius, and scapular region in order to reduce fascial restrictions and improve pain and ROM.  -AA/ROM: supine - protraction, flexion, abduction, horizontal abduction, er/IR, x12 - mod assist on dowel to assist with form -A/ROM: side lying - protraction, flexion, abduction, horizontal abduction, er/IR, x10 - mod hands on assist for form -Scapular Strengthening: red band, extension, retractions, rows, x10  08/02/24 -Manual Therapy: myofascial release and trigger point applied to biceps, trapezius, and scapular region in  order to reduce fascial restrictions and improve pain and ROM.  -AA/ROM: supine - protraction, flexion, abduction, horizontal abduction, er/IR, x12 - mod assist on dowel to assist with form -A/ROM: supine - protraction, flexion, abduction, horizontal abduction, er/IR, x10 - mod hands on assist for form -Pulleys: flexion, abduction, x10  07/26/24 -Manual Therapy: myofascial release and trigger point applied to biceps, trapezius, and scapular region in order to reduce fascial restrictions and improve pain and ROM.  -P/Rom: supine - flexion, abduction, er/IR, horizontal abduction x10 -AA/ROM: supine - protraction, flexion, abduction, horizontal abduction, er/IR, x12 -A/ROM: supine - protraction, flexion, min assist past 90* at elbow   PATIENT EDUCATION: Education details: A/ROM in side lying Person educated: Patient Education method: Explanation, Demonstration, and Handouts Education comprehension: verbalized understanding and returned demonstration  HOME EXERCISE PROGRAM: 10/31: Elbow  and Wrist ROM and Pendulums 11/4: Table Slides 11/20: AA/ROM 12/1: forward reaching for elbow extension 12/15: A/ROM in side lying  GOALS: Goals reviewed with patient? Yes   SHORT TERM GOALS: Target date: 07/22/24  Pt will be provided with and educated on HEP to improve mobility in LUE required for use during ADL completion.   Goal status: IN PROGRESS  2.  Pt will increase LUE P/ROM by 40 degrees to improve ability to use LUE during dressing tasks with minimal compensatory techniques.   Goal status: IN PROGRESS  3.  Pt will increase LUE strength to 3+/5 to improve ability to reach for items at waist to chest height during bathing and grooming tasks.   Goal status: IN PROGRESS   LONG TERM GOALS: Target date: 08/21/24  Pt will decrease pain in LUE to 3/10 or less to improve ability to sleep for 2+ consecutive hours without waking due to pain.   Goal status: IN PROGRESS  2.  Pt will decrease  LUE fascial restrictions to min amounts or less to improve mobility required for functional reaching tasks.   Goal status: IN PROGRESS  3.  Pt will increase LUE A/ROM by 50 degrees to improve ability to use LUE when reaching overhead or behind back during dressing and bathing tasks.   Goal status: IN PROGRESS  4.  Pt will increase LUE strength to 4+/5 or greater to improve ability to use LUE when lifting or carrying items during meal preparation/housework/yardwork tasks.   Goal status: IN PROGRESS  5.  Pt will return to highest level of function using LUE as non-dominant during functional task completion.   Goal status: IN PROGRESS   ASSESSMENT:  CLINICAL IMPRESSION: This session pt continued to work on her ROM both with assistance and actively. OT also added scapular strengthening to improve accessory muscle strengthening for overall stability. Additionally, OT had pt completing A/ROM in side lying to work on improving overall range. Verbal and tactile cuing provided for positioning and technique throughout session.   PERFORMANCE DEFICITS: in functional skills including in functional skills including ADLs, IADLs, coordination, tone, ROM, strength, pain, fascial restrictions, muscle spasms, and UE functional use.   PLAN:  OT FREQUENCY: 2x/week  OT DURATION: 8 weeks  PLANNED INTERVENTIONS: 97168 OT Re-evaluation, 97535 self care/ADL training, 02889 therapeutic exercise, 97530 therapeutic activity, 97112 neuromuscular re-education, 97140 manual therapy, 97035 ultrasound, 97010 moist heat, 97032 electrical stimulation (manual), passive range of motion, functional mobility training, energy conservation, coping strategies training, patient/family education, and DME and/or AE instructions  RECOMMENDED OTHER SERVICES: N/A  CONSULTED AND AGREED WITH PLAN OF CARE: Patient  PLAN FOR NEXT SESSION: Manual Therapy, P/ROM, Pendulums, Table Slides   Valentin Nightingale, OTR/L [facility] [phone] Makailee Nudelman Jillyn Nightingale, OT 08/07/2024, 11:14 PM

## 2024-08-09 ENCOUNTER — Ambulatory Visit (HOSPITAL_COMMUNITY): Admitting: Occupational Therapy

## 2024-08-09 DIAGNOSIS — R29898 Other symptoms and signs involving the musculoskeletal system: Secondary | ICD-10-CM

## 2024-08-09 DIAGNOSIS — M25512 Pain in left shoulder: Secondary | ICD-10-CM

## 2024-08-09 DIAGNOSIS — M25612 Stiffness of left shoulder, not elsewhere classified: Secondary | ICD-10-CM

## 2024-08-09 NOTE — Therapy (Signed)
 OUTPATIENT OCCUPATIONAL THERAPY ORTHO TREATMENT NOTE  Patient Name: Misty Cruz MRN: [id number] DOB:1961/04/11, 63 y.o., female Today's Date: 08/09/2024   END OF SESSION:  OT End of Session - 08/09/24 1534     Visit Number 11    Number of Visits 16    Date for Recertification  08/21/24    Authorization Type [employer]    OT Start Time 1308    OT Stop Time 1348    OT Time Calculation (min) 40 min    Activity Tolerance Patient tolerated treatment well    Behavior During Therapy [facility] for tasks assessed/performed          Past Medical History:  Diagnosis Date   Anxiety    Diabetes mellitus    History of kidney stones    Hypercholesteremia    Hypertension    Hyperthyroidism    Scoliosis    Thyroid disease    Past Surgical History:  Procedure Laterality Date   BREAST BIOPSY Right 07/14/2023   MM RT BREAST BX W LOC DEV 1ST LESION IMAGE BX SPEC STEREO GUIDE 07/14/2023 [facility]   COLONOSCOPY N/A 06/04/2013   Procedure: COLONOSCOPY;  Surgeon: Margo LITTIE Haddock, MD;  [facility];  Service: Endoscopy;  Laterality: N/A;  9:30 AM   CYSTOSCOPY WITH RETROGRADE PYELOGRAM, URETEROSCOPY AND STENT PLACEMENT Bilateral 03/03/2023   Procedure: CYSTOSCOPY WITH RETROGRADE PYELOGRAM, URETEROSCOPY AND STENT PLACEMENT;  Surgeon: Sherrilee Belvie LITTIE, MD;  Location: [facility];  Service: Urology;  Laterality: Bilateral;   CYSTOSCOPY/RETROGRADE/URETEROSCOPY/STONE EXTRACTION WITH BASKET Bilateral 03/01/2024   Procedure: CYSTOSCOPY, WITH CALCULUS REMOVAL USING BASKET;  Surgeon: Sherrilee Belvie LITTIE, MD;  Location: [facility];  Service: Urology;  Laterality: Bilateral;   CYSTOSCOPY/URETEROSCOPY/HOLMIUM LASER/STENT PLACEMENT Bilateral 03/01/2024   Procedure: BILATERAL CYSTOSCOPY/URETEROSCOPY/HOLMIUM LASER,RIGHT STENT PLACEMENT;  Surgeon: Sherrilee Belvie LITTIE, MD;  Location: [facility];  Service: Urology;  Laterality: Bilateral;   HOLMIUM LASER APPLICATION Bilateral 03/03/2023   Procedure:  HOLMIUM LASER APPLICATION;  Surgeon: Sherrilee Belvie LITTIE, MD;  Location: [facility];  Service: Urology;  Laterality: Bilateral;   HOLMIUM LASER APPLICATION Bilateral 03/01/2024   Procedure: HOLMIUM LASER APPLICATION;  Surgeon: Sherrilee Belvie LITTIE, MD;  Location: [facility];  Service: Urology;  Laterality: Bilateral;   REVERSE SHOULDER ARTHROPLASTY Left 06/07/2024   Procedure: ARTHROPLASTY, SHOULDER, TOTAL, REVERSE;  Surgeon: Onesimo Oneil LABOR, MD;  Location: [facility];  Service: Orthopedics;  Laterality: Left;   STONE EXTRACTION WITH BASKET Bilateral 03/03/2023   Procedure: STONE EXTRACTION WITH BASKET;  Surgeon: Sherrilee Belvie LITTIE, MD;  Location: [facility];  Service: Urology;  Laterality: Bilateral;   TUBAL LIGATION     Patient Active Problem List   Diagnosis Date Noted   Left rotator cuff tear 06/07/2024   Arthritis of right knee 03/20/2024   Hematuria 03/20/2024   Hypothyroidism 03/20/2024   Obesity 03/20/2024   Seasonal allergies 03/20/2024   Hypertensive disorder 11/30/2023   Former smoker 11/30/2023   Hyperlipemia 11/30/2023   Hyperlipidemia associated with type 2 diabetes mellitus (HCC) 11/30/2023   Vitamin D deficiency 11/30/2023   Nephrolithiasis 03/03/2023   Diabetes mellitus (HCC) 12/10/2015    PCP: Margarete Gibney, FNP REFERRING PROVIDER: Onesimo Oneil, MD  ONSET DATE: 06/07/24  REFERRING DIAG: L reverse total shoulder   THERAPY DIAG:  Stiffness of left shoulder, not elsewhere classified  Acute pain of left shoulder  Other symptoms and signs involving the musculoskeletal system  Rationale for Evaluation and Treatment: Rehabilitation  SUBJECTIVE:   SUBJECTIVE STATEMENT: It's bothering me some Pt  accompanied by: self  PERTINENT HISTORY: 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 Yes: NPRS scale: 8/10 Pain location: anterior shoulder girdle  Pain  description: aching and heavy Aggravating factors: everything Relieving factors: tylenol   FALLS: Has patient fallen in last 6 months? Yes. Number of falls 1  PLOF: Independent  PATIENT GOALS: To get her arm use back  NEXT MD VISIT: 07/18/24  OBJECTIVE:   HAND DOMINANCE: Right  ADLs: Overall ADLs: Pt unable to reach up or behind her back, requiring mod assist for all dressing and bathing, unable to lift anything to complete cooking or cleaning. Having to sleep in a recliner - unable to sleep in bed due to pain  FUNCTIONAL OUTCOME MEASURES: Quick Dash: 79.55  UPPER EXTREMITY ROM:       Assessed in supine, er/IR adducted  Passive ROM Left eval  Shoulder flexion 103  Shoulder abduction 84  Shoulder internal rotation 90  Shoulder external rotation 30  (Blank rows = not tested)    UPPER EXTREMITY MMT:     Assessed in seated, er/IR adducted  MMT Left eval  Shoulder flexion   Shoulder abduction   Shoulder internal rotation   Shoulder external rotation   (Blank rows = not tested)  SENSATION: WFL  EDEMA: Mild swelling noted in the hand  OBSERVATIONS: Moderate fascial restriction   TODAY'S TREATMENT:                                                                                                                              DATE:   08/09/24 -Manual Therapy: myofascial release and trigger point applied to biceps, trapezius, and scapular region in order to reduce fascial restrictions and improve pain and ROM.  -AA/ROM: supine - protraction, flexion, abduction, horizontal abduction, er/IR, x12 - mod assist on dowel to assist with form -A/ROM: supine- protraction, flexion, abduction, horizontal abduction, er/IR, x10 - mod hands on assist for form -Scapular Strengthening: red band, extension, retractions, rows, x10 -Wall Climbs: flexion, x10 -UBE: Level 2, 2.5' forwards and backwards  08/06/24 -Manual Therapy: myofascial release and trigger point applied to biceps,  trapezius, and scapular region in order to reduce fascial restrictions and improve pain and ROM.  -AA/ROM: supine - protraction, flexion, abduction, horizontal abduction, er/IR, x12 - mod assist on dowel to assist with form -A/ROM: side lying - protraction, flexion, abduction, horizontal abduction, er/IR, x10 - mod hands on assist for form -Scapular Strengthening: red band, extension, retractions, rows, x10  08/02/24 -Manual Therapy: myofascial release and trigger point applied to biceps, trapezius, and scapular region in order to reduce fascial restrictions and improve pain and ROM.  -AA/ROM: supine - protraction, flexion, abduction, horizontal abduction, er/IR, x12 - mod assist on dowel to assist with form -A/ROM: supine - protraction, flexion, abduction, horizontal abduction, er/IR, x10 - mod hands on assist for  form -Pulleys: flexion, abduction, x10   PATIENT EDUCATION: Education details: A/ROM in side lying Person educated: Patient Education method: Explanation, Demonstration, and Handouts Education comprehension: verbalized understanding and returned demonstration  HOME EXERCISE PROGRAM: 10/31: Elbow and Wrist ROM and Pendulums 11/4: Table Slides 11/20: AA/ROM 12/1: forward reaching for elbow extension 12/15: A/ROM in side lying  GOALS: Goals reviewed with patient? Yes   SHORT TERM GOALS: Target date: 07/22/24  Pt will be provided with and educated on HEP to improve mobility in LUE required for use during ADL completion.   Goal status: IN PROGRESS  2.  Pt will increase LUE P/ROM by 40 degrees to improve ability to use LUE during dressing tasks with minimal compensatory techniques.   Goal status: IN PROGRESS  3.  Pt will increase LUE strength to 3+/5 to improve ability to reach for items at waist to chest height during bathing and grooming tasks.   Goal status: IN PROGRESS   LONG TERM GOALS: Target date: 08/21/24  Pt will decrease pain in LUE to 3/10 or less to  improve ability to sleep for 2+ consecutive hours without waking due to pain.   Goal status: IN PROGRESS  2.  Pt will decrease LUE fascial restrictions to min amounts or less to improve mobility required for functional reaching tasks.   Goal status: IN PROGRESS  3.  Pt will increase LUE A/ROM by 50 degrees to improve ability to use LUE when reaching overhead or behind back during dressing and bathing tasks.   Goal status: IN PROGRESS  4.  Pt will increase LUE strength to 4+/5 or greater to improve ability to use LUE when lifting or carrying items during meal preparation/housework/yardwork tasks.   Goal status: IN PROGRESS  5.  Pt will return to highest level of function using LUE as non-dominant during functional task completion.   Goal status: IN PROGRESS   ASSESSMENT:  CLINICAL IMPRESSION: Pt reported increased pain this session, however she tolerated active ROM in supine this session, requiring intermittent mod assist for form. Pt requiring mod assist throughout entire session for form. Overall appears to tolerate exercises this session with improved stamina, requiring less rest breaks this session. In addition to hands on assist, pt requiring verbal and tactile cuing for positioning throughout session.   PERFORMANCE DEFICITS: in functional skills including in functional skills including ADLs, IADLs, coordination, tone, ROM, strength, pain, fascial restrictions, muscle spasms, and UE functional use.   PLAN:  OT FREQUENCY: 2x/week  OT DURATION: 8 weeks  PLANNED INTERVENTIONS: 97168 OT Re-evaluation, 97535 self care/ADL training, 02889 therapeutic exercise, 97530 therapeutic activity, 97112 neuromuscular re-education, 97140 manual therapy, 97035 ultrasound, 97010 moist heat, 97032 electrical stimulation (manual), passive range of motion, functional mobility training, energy conservation, coping strategies training, patient/family education, and DME and/or AE  instructions  RECOMMENDED OTHER SERVICES: N/A  CONSULTED AND AGREED WITH PLAN OF CARE: Patient  PLAN FOR NEXT SESSION: Manual Therapy, P/ROM, Pendulums, Table Slides   Valentin Nightingale, OTR/L [facility] [phone] Nolyn Eilert Jillyn Nightingale, OT 08/09/2024, 3:37 PM

## 2024-08-15 ENCOUNTER — Ambulatory Visit (HOSPITAL_COMMUNITY): Admitting: Occupational Therapy

## 2024-08-15 ENCOUNTER — Encounter (HOSPITAL_COMMUNITY): Payer: Self-pay | Admitting: Occupational Therapy

## 2024-08-15 DIAGNOSIS — M25612 Stiffness of left shoulder, not elsewhere classified: Secondary | ICD-10-CM

## 2024-08-15 DIAGNOSIS — M25512 Pain in left shoulder: Secondary | ICD-10-CM | POA: Diagnosis not present

## 2024-08-15 DIAGNOSIS — R29898 Other symptoms and signs involving the musculoskeletal system: Secondary | ICD-10-CM

## 2024-08-15 NOTE — Therapy (Signed)
 " OUTPATIENT OCCUPATIONAL THERAPY ORTHO TREATMENT NOTE  Patient Name: Misty Cruz MRN: [id number] DOB:26-May-1961, 63 y.o., female Today's Date: 08/15/2024   END OF SESSION:  OT End of Session - 08/15/24 1013     Visit Number 12    Number of Visits 16    Date for Recertification  08/21/24    Authorization Type [employer]    OT Start Time 0901    OT Stop Time [phone]    OT Time Calculation (min) 41 min    Activity Tolerance Patient tolerated treatment well    Behavior During Therapy [facility] for tasks assessed/performed          Past Medical History:  Diagnosis Date   Anxiety    Diabetes mellitus    History of kidney stones    Hypercholesteremia    Hypertension    Hyperthyroidism    Scoliosis    Thyroid disease    Past Surgical History:  Procedure Laterality Date   BREAST BIOPSY Right 07/14/2023   MM RT BREAST BX W LOC DEV 1ST LESION IMAGE BX SPEC STEREO GUIDE 07/14/2023 [facility]   COLONOSCOPY N/A 06/04/2013   Procedure: COLONOSCOPY;  Surgeon: Margo LITTIE Haddock, MD;  [facility];  Service: Endoscopy;  Laterality: N/A;  9:30 AM   CYSTOSCOPY WITH RETROGRADE PYELOGRAM, URETEROSCOPY AND STENT PLACEMENT Bilateral 03/03/2023   Procedure: CYSTOSCOPY WITH RETROGRADE PYELOGRAM, URETEROSCOPY AND STENT PLACEMENT;  Surgeon: Sherrilee Belvie LITTIE, MD;  Location: [facility];  Service: Urology;  Laterality: Bilateral;   CYSTOSCOPY/RETROGRADE/URETEROSCOPY/STONE EXTRACTION WITH BASKET Bilateral 03/01/2024   Procedure: CYSTOSCOPY, WITH CALCULUS REMOVAL USING BASKET;  Surgeon: Sherrilee Belvie LITTIE, MD;  Location: [facility];  Service: Urology;  Laterality: Bilateral;   CYSTOSCOPY/URETEROSCOPY/HOLMIUM LASER/STENT PLACEMENT Bilateral 03/01/2024   Procedure: BILATERAL CYSTOSCOPY/URETEROSCOPY/HOLMIUM LASER,RIGHT STENT PLACEMENT;  Surgeon: Sherrilee Belvie LITTIE, MD;  Location: [facility];  Service: Urology;  Laterality: Bilateral;   HOLMIUM LASER APPLICATION Bilateral 03/03/2023   Procedure:  HOLMIUM LASER APPLICATION;  Surgeon: Sherrilee Belvie LITTIE, MD;  Location: [facility];  Service: Urology;  Laterality: Bilateral;   HOLMIUM LASER APPLICATION Bilateral 03/01/2024   Procedure: HOLMIUM LASER APPLICATION;  Surgeon: Sherrilee Belvie LITTIE, MD;  Location: [facility];  Service: Urology;  Laterality: Bilateral;   REVERSE SHOULDER ARTHROPLASTY Left 06/07/2024   Procedure: ARTHROPLASTY, SHOULDER, TOTAL, REVERSE;  Surgeon: Onesimo Oneil LABOR, MD;  Location: [facility];  Service: Orthopedics;  Laterality: Left;   STONE EXTRACTION WITH BASKET Bilateral 03/03/2023   Procedure: STONE EXTRACTION WITH BASKET;  Surgeon: Sherrilee Belvie LITTIE, MD;  Location: [facility];  Service: Urology;  Laterality: Bilateral;   TUBAL LIGATION     Patient Active Problem List   Diagnosis Date Noted   Left rotator cuff tear 06/07/2024   Arthritis of right knee 03/20/2024   Hematuria 03/20/2024   Hypothyroidism 03/20/2024   Obesity 03/20/2024   Seasonal allergies 03/20/2024   Hypertensive disorder 11/30/2023   Former smoker 11/30/2023   Hyperlipemia 11/30/2023   Hyperlipidemia associated with type 2 diabetes mellitus (HCC) 11/30/2023   Vitamin D deficiency 11/30/2023   Nephrolithiasis 03/03/2023   Diabetes mellitus (HCC) 12/10/2015    PCP: Margarete Gibney, FNP REFERRING PROVIDER: Onesimo Oneil, MD  ONSET DATE: 06/07/24  REFERRING DIAG: L reverse total shoulder   THERAPY DIAG:  Stiffness of left shoulder, not elsewhere classified  Acute pain of left shoulder  Other symptoms and signs involving the musculoskeletal system  Rationale for Evaluation and Treatment: Rehabilitation  SUBJECTIVE:   SUBJECTIVE STATEMENT: It's bothering me some  Pt accompanied by: self  PERTINENT HISTORY: 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 Yes: NPRS scale: 8/10 Pain location: anterior shoulder girdle  Pain  description: aching and heavy Aggravating factors: everything Relieving factors: tylenol   FALLS: Has patient fallen in last 6 months? Yes. Number of falls 1  PLOF: Independent  PATIENT GOALS: To get her arm use back  NEXT MD VISIT: 07/18/24  OBJECTIVE:   HAND DOMINANCE: Right  ADLs: Overall ADLs: Pt unable to reach up or behind her back, requiring mod assist for all dressing and bathing, unable to lift anything to complete cooking or cleaning. Having to sleep in a recliner - unable to sleep in bed due to pain  FUNCTIONAL OUTCOME MEASURES: Quick Dash: 79.55  UPPER EXTREMITY ROM:       Assessed in supine, er/IR adducted  Passive ROM Left eval  Shoulder flexion 103  Shoulder abduction 84  Shoulder internal rotation 90  Shoulder external rotation 30  (Blank rows = not tested)    UPPER EXTREMITY MMT:     Assessed in seated, er/IR adducted  MMT Left eval  Shoulder flexion   Shoulder abduction   Shoulder internal rotation   Shoulder external rotation   (Blank rows = not tested)  SENSATION: WFL  EDEMA: Mild swelling noted in the hand  OBSERVATIONS: Moderate fascial restriction   TODAY'S TREATMENT:                                                                                                                              DATE:   08/15/24 -Manual Therapy: myofascial release and trigger point applied to biceps, trapezius, and scapular region in order to reduce fascial restrictions and improve pain and ROM.  -P/ROM: supine - flexion, abduction, horizontal abduction, er/IR, x10 -A/ROM: supine - flexion, abduction, protraction, horizontal abduction, er/IR, x10 -Scapular Strengthening: red band, extension, retractions, rows, x10 -Shoulder strengthening: red band, horizontal abduction, er, IR, x10  08/09/24 -Manual Therapy: myofascial release and trigger point applied to biceps, trapezius, and scapular region in order to reduce fascial restrictions and improve pain  and ROM.  -AA/ROM: supine - protraction, flexion, abduction, horizontal abduction, er/IR, x12 - mod assist on dowel to assist with form -A/ROM: supine- protraction, flexion, abduction, horizontal abduction, er/IR, x10 - mod hands on assist for form -Scapular Strengthening: red band, extension, retractions, rows, x10 -Wall Climbs: flexion, x10 -UBE: Level 2, 2.5' forwards and backwards  08/06/24 -Manual Therapy: myofascial release and trigger point applied to biceps, trapezius, and scapular region in order to reduce fascial restrictions and improve pain and ROM.  -AA/ROM: supine - protraction, flexion, abduction, horizontal abduction, er/IR, x12 - mod assist on dowel to assist with form -A/ROM: side lying - protraction, flexion, abduction, horizontal abduction, er/IR, x10 - mod hands on assist for form -Scapular Strengthening: red band, extension, retractions,  rows, x10   PATIENT EDUCATION: Education details: Publishing Rights Manager Person educated: Patient Education method: Explanation, Demonstration, and Handouts Education comprehension: verbalized understanding and returned demonstration  HOME EXERCISE PROGRAM: 10/31: Elbow and Wrist ROM and Pendulums 11/4: Table Slides 11/20: AA/ROM 12/1: forward reaching for elbow extension 12/15: A/ROM in side lying 12/24: Scapular Strengthening  GOALS: Goals reviewed with patient? Yes   SHORT TERM GOALS: Target date: 07/22/24  Pt will be provided with and educated on HEP to improve mobility in LUE required for use during ADL completion.   Goal status: IN PROGRESS  2.  Pt will increase LUE P/ROM by 40 degrees to improve ability to use LUE during dressing tasks with minimal compensatory techniques.   Goal status: IN PROGRESS  3.  Pt will increase LUE strength to 3+/5 to improve ability to reach for items at waist to chest height during bathing and grooming tasks.   Goal status: IN PROGRESS   LONG TERM GOALS: Target date:  08/21/24  Pt will decrease pain in LUE to 3/10 or less to improve ability to sleep for 2+ consecutive hours without waking due to pain.   Goal status: IN PROGRESS  2.  Pt will decrease LUE fascial restrictions to min amounts or less to improve mobility required for functional reaching tasks.   Goal status: IN PROGRESS  3.  Pt will increase LUE A/ROM by 50 degrees to improve ability to use LUE when reaching overhead or behind back during dressing and bathing tasks.   Goal status: IN PROGRESS  4.  Pt will increase LUE strength to 4+/5 or greater to improve ability to use LUE when lifting or carrying items during meal preparation/housework/yardwork tasks.   Goal status: IN PROGRESS  5.  Pt will return to highest level of function using LUE as non-dominant during functional task completion.   Goal status: IN PROGRESS   ASSESSMENT:  CLINICAL IMPRESSION: Pt reported no pain prior to starting session. She continues to have significant fatigue and muscle soreness reported throughout session. Pt struggles to achieve even 50% of full ROM due to soreness. OT providing constant hands on cuing and assist for proper technique and positioning, as well as attempting/maintaining proper form/movement throughout each exercise.   PERFORMANCE DEFICITS: in functional skills including in functional skills including ADLs, IADLs, coordination, tone, ROM, strength, pain, fascial restrictions, muscle spasms, and UE functional use.   PLAN:  OT FREQUENCY: 2x/week  OT DURATION: 8 weeks  PLANNED INTERVENTIONS: 97168 OT Re-evaluation, 97535 self care/ADL training, 02889 therapeutic exercise, 97530 therapeutic activity, 97112 neuromuscular re-education, 97140 manual therapy, 97035 ultrasound, 97010 moist heat, 97032 electrical stimulation (manual), passive range of motion, functional mobility training, energy conservation, coping strategies training, patient/family education, and DME and/or AE  instructions  RECOMMENDED OTHER SERVICES: N/A  CONSULTED AND AGREED WITH PLAN OF CARE: Patient  PLAN FOR NEXT SESSION: Manual Therapy, P/ROM, Pendulums, Table Slides   Valentin Nightingale, OTR/L [facility] [phone] Rayma Hegg Jillyn Nightingale, OT 08/15/2024, 10:14 AM   "

## 2024-08-20 ENCOUNTER — Ambulatory Visit (HOSPITAL_COMMUNITY): Admitting: Occupational Therapy

## 2024-08-22 ENCOUNTER — Ambulatory Visit (HOSPITAL_COMMUNITY): Admitting: Occupational Therapy

## 2024-08-22 ENCOUNTER — Encounter (HOSPITAL_COMMUNITY): Payer: Self-pay | Admitting: Occupational Therapy

## 2024-08-22 DIAGNOSIS — M25512 Pain in left shoulder: Secondary | ICD-10-CM

## 2024-08-22 DIAGNOSIS — M25612 Stiffness of left shoulder, not elsewhere classified: Secondary | ICD-10-CM

## 2024-08-22 DIAGNOSIS — R29898 Other symptoms and signs involving the musculoskeletal system: Secondary | ICD-10-CM

## 2024-08-22 NOTE — Addendum Note (Signed)
 Addended by: Ernan Runkles E on: 08/22/2024 10:39 AM   Modules accepted: Orders

## 2024-08-22 NOTE — Patient Instructions (Signed)

## 2024-08-22 NOTE — Therapy (Signed)
 " OUTPATIENT OCCUPATIONAL THERAPY ORTHO TREATMENT NOTE  Patient Name: Misty Cruz MRN: [id number] DOB:[birth year], 63 y.o., female Today's Date: 08/22/2024  Progress Note Reporting Period 06/22/24 to 08/22/24  See note below for Objective Data and Assessment of Progress/Goals.    END OF SESSION:  OT End of Session - 08/22/24 1032     Visit Number 13    Number of Visits 21    Date for Recertification  09/28/24    Authorization Type [employer] Medicare    OT Start Time [phone]    OT Stop Time [phone]    OT Time Calculation (min) 43 min    Activity Tolerance Patient tolerated treatment well    Behavior During Therapy [facility] for tasks assessed/performed          Past Medical History:  Diagnosis Date   Anxiety    Diabetes mellitus    History of kidney stones    Hypercholesteremia    Hypertension    Hyperthyroidism    Scoliosis    Thyroid disease    Past Surgical History:  Procedure Laterality Date   BREAST BIOPSY Right 07/14/2023   MM RT BREAST BX W LOC DEV 1ST LESION IMAGE BX SPEC STEREO GUIDE 07/14/2023 [facility]   COLONOSCOPY N/A 06/04/2013   Procedure: COLONOSCOPY;  Surgeon: Margo LITTIE Haddock, MD;  [facility];  Service: Endoscopy;  Laterality: N/A;  9:30 AM   CYSTOSCOPY WITH RETROGRADE PYELOGRAM, URETEROSCOPY AND STENT PLACEMENT Bilateral 03/03/2023   Procedure: CYSTOSCOPY WITH RETROGRADE PYELOGRAM, URETEROSCOPY AND STENT PLACEMENT;  Surgeon: Sherrilee Belvie LITTIE, MD;  Location: [facility];  Service: Urology;  Laterality: Bilateral;   CYSTOSCOPY/RETROGRADE/URETEROSCOPY/STONE EXTRACTION WITH BASKET Bilateral 03/01/2024   Procedure: CYSTOSCOPY, WITH CALCULUS REMOVAL USING BASKET;  Surgeon: Sherrilee Belvie LITTIE, MD;  Location: [facility];  Service: Urology;  Laterality: Bilateral;   CYSTOSCOPY/URETEROSCOPY/HOLMIUM LASER/STENT PLACEMENT Bilateral 03/01/2024   Procedure: BILATERAL CYSTOSCOPY/URETEROSCOPY/HOLMIUM LASER,RIGHT STENT PLACEMENT;  Surgeon: Sherrilee Belvie LITTIE,  MD;  Location: [facility];  Service: Urology;  Laterality: Bilateral;   HOLMIUM LASER APPLICATION Bilateral 03/03/2023   Procedure: HOLMIUM LASER APPLICATION;  Surgeon: Sherrilee Belvie LITTIE, MD;  Location: [facility];  Service: Urology;  Laterality: Bilateral;   HOLMIUM LASER APPLICATION Bilateral 03/01/2024   Procedure: HOLMIUM LASER APPLICATION;  Surgeon: Sherrilee Belvie LITTIE, MD;  Location: [facility];  Service: Urology;  Laterality: Bilateral;   REVERSE SHOULDER ARTHROPLASTY Left 06/07/2024   Procedure: ARTHROPLASTY, SHOULDER, TOTAL, REVERSE;  Surgeon: Onesimo Oneil LABOR, MD;  Location: [facility];  Service: Orthopedics;  Laterality: Left;   STONE EXTRACTION WITH BASKET Bilateral 03/03/2023   Procedure: STONE EXTRACTION WITH BASKET;  Surgeon: Sherrilee Belvie LITTIE, MD;  Location: [facility];  Service: Urology;  Laterality: Bilateral;   TUBAL LIGATION     Patient Active Problem List   Diagnosis Date Noted   Left rotator cuff tear 06/07/2024   Arthritis of right knee 03/20/2024   Hematuria 03/20/2024   Hypothyroidism 03/20/2024   Obesity 03/20/2024   Seasonal allergies 03/20/2024   Hypertensive disorder 11/30/2023   Former smoker 11/30/2023   Hyperlipemia 11/30/2023   Hyperlipidemia associated with type 2 diabetes mellitus (HCC) 11/30/2023   Vitamin D deficiency 11/30/2023   Nephrolithiasis 03/03/2023   Diabetes mellitus (HCC) 12/10/2015    PCP: Margarete Gibney, FNP REFERRING PROVIDER: Onesimo Oneil, MD  ONSET DATE: 06/07/24  REFERRING DIAG: L reverse total shoulder   THERAPY DIAG:  Stiffness of left shoulder, not elsewhere classified  Acute pain of left shoulder  Other symptoms and signs involving  the musculoskeletal system  Rationale for Evaluation and Treatment: Rehabilitation  SUBJECTIVE:   SUBJECTIVE STATEMENT: It still feels tight Pt accompanied by: self  PERTINENT HISTORY: 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 Yes: NPRS scale: 8/10 Pain location: anterior shoulder girdle  Pain description: aching and heavy Aggravating factors: everything Relieving factors: tylenol   FALLS: Has patient fallen in last 6 months? Yes. Number of falls 1  PLOF: Independent  PATIENT GOALS: To get her arm use back  NEXT MD VISIT: 07/18/24  OBJECTIVE:   HAND DOMINANCE: Right  ADLs: Overall ADLs: Pt unable to reach up or behind her back, requiring mod assist for all dressing and bathing, unable to lift anything to complete cooking or cleaning. Having to sleep in a recliner - unable to sleep in bed due to pain  FUNCTIONAL OUTCOME MEASURES: Quick Dash: 79.55 08/22/24: 65.91  UPPER EXTREMITY ROM:       Assessed in supine, er/IR adducted  Passive ROM Left eval Left 08/22/24  Shoulder flexion 103 153  Shoulder abduction 84 168  Shoulder internal rotation 90 90  Shoulder external rotation 30 51  (Blank rows = not tested)  Assessed in sitting, er/IR adducted  Active ROM Left eval Left 08/22/24  Shoulder flexion  105  Shoulder abduction  80  Shoulder internal rotation  90  Shoulder external rotation  12  (Blank rows = not tested)  UPPER EXTREMITY MMT:     Assessed in seated, er/IR adducted  MMT Left eval Left 08/22/24  Shoulder flexion  3+/5  Shoulder abduction  3+/5  Shoulder internal rotation  3/5  Shoulder external rotation  2+/5  (Blank rows = not tested)  SENSATION: WFL  EDEMA: Mild swelling noted in the hand  OBSERVATIONS: Moderate fascial restriction   TODAY'S TREATMENT:                                                                                                                              DATE:   08/22/24 -Manual Therapy: myofascial release and trigger point applied to biceps, trapezius, and scapular region in order to reduce fascial restrictions and improve pain and ROM.  -P/ROM: supine - flexion, abduction,  horizontal abduction, er/IR, x10 -A/ROM: supine - flexion, abduction, protraction, horizontal abduction, er/IR, x15 -A/ROM: seated - flexion, abduction, er/IR, x10 -Reassessment -PNF strengthening: red band, chest pulls, PNF down, yellow band, er pulls, PNF up, x10  08/15/24 -Manual Therapy: myofascial release and trigger point applied to biceps, trapezius, and scapular region in order to reduce fascial restrictions and improve pain and ROM.  -P/ROM: supine - flexion, abduction, horizontal abduction, er/IR, x10 -A/ROM: supine - flexion, abduction, protraction, horizontal abduction, er/IR, x10 -Scapular Strengthening: red band, extension, retractions, rows, x10 -Shoulder strengthening: red band, horizontal abduction, er, IR, x10  08/09/24 -Manual Therapy: myofascial release and trigger point applied  to biceps, trapezius, and scapular region in order to reduce fascial restrictions and improve pain and ROM.  -AA/ROM: supine - protraction, flexion, abduction, horizontal abduction, er/IR, x12 - mod assist on dowel to assist with form -A/ROM: supine- protraction, flexion, abduction, horizontal abduction, er/IR, x10 - mod hands on assist for form -Scapular Strengthening: red band, extension, retractions, rows, x10 -Wall Climbs: flexion, x10 -UBE: Level 2, 2.5' forwards and backwards   PATIENT EDUCATION: Education details: PNF Strengthening Person educated: Patient Education method: Programmer, Multimedia, Demonstration, and Handouts Education comprehension: verbalized understanding and returned demonstration  HOME EXERCISE PROGRAM: 10/31: Elbow and Wrist ROM and Pendulums 11/4: Table Slides 11/20: AA/ROM 12/1: forward reaching for elbow extension 12/15: A/ROM in side lying 12/24: Scapular Strengthening 12/31: PNF Strengthening  GOALS: Goals reviewed with patient? Yes   SHORT TERM GOALS: Target date: 07/22/24  Pt will be provided with and educated on HEP to improve mobility in LUE required  for use during ADL completion.   Goal status: IN PROGRESS  2.  Pt will increase LUE P/ROM by 40 degrees to improve ability to use LUE during dressing tasks with minimal compensatory techniques.   Goal status: MET  3.  Pt will increase LUE strength to 3+/5 to improve ability to reach for items at waist to chest height during bathing and grooming tasks.   Goal status: IN PROGRESS   LONG TERM GOALS: Target date: 08/21/24  Pt will decrease pain in LUE to 3/10 or less to improve ability to sleep for 2+ consecutive hours without waking due to pain.   Goal status: IN PROGRESS  2.  Pt will decrease LUE fascial restrictions to min amounts or less to improve mobility required for functional reaching tasks.   Goal status: IN PROGRESS  3.  Pt will increase LUE A/ROM by 50 degrees to improve ability to use LUE when reaching overhead or behind back during dressing and bathing tasks.   Goal status: IN PROGRESS  4.  Pt will increase LUE strength to 4+/5 or greater to improve ability to use LUE when lifting or carrying items during meal preparation/housework/yardwork tasks.   Goal status: IN PROGRESS  5.  Pt will return to highest level of function using LUE as non-dominant during functional task completion.   Goal status: IN PROGRESS   ASSESSMENT:  CLINICAL IMPRESSION: Pt completed reassessment this session where she demonstrated improved P/ROM to 80% of full ROM, however her A/ROM continues to be significantly limited due to weakness. She is unable to reach overhead and is unable to withstand against mild resistance. OT providing verbal and tactile cuing for positioning and technique throughout session.   PERFORMANCE DEFICITS: in functional skills including in functional skills including ADLs, IADLs, coordination, tone, ROM, strength, pain, fascial restrictions, muscle spasms, and UE functional use.   PLAN:  OT FREQUENCY: 2x/week  OT DURATION: 8 weeks  PLANNED INTERVENTIONS: 97168  OT Re-evaluation, 97535 self care/ADL training, 02889 therapeutic exercise, 97530 therapeutic activity, 97112 neuromuscular re-education, 97140 manual therapy, 97035 ultrasound, 97010 moist heat, 97032 electrical stimulation (manual), passive range of motion, functional mobility training, energy conservation, coping strategies training, patient/family education, and DME and/or AE instructions  RECOMMENDED OTHER SERVICES: N/A  CONSULTED AND AGREED WITH PLAN OF CARE: Patient  PLAN FOR NEXT SESSION: Manual Therapy, P/ROM, Pendulums, Table Slides   Valentin Nightingale, OTR/L [facility] [phone] Valentin Jillyn Nightingale, OT 08/22/2024, 10:33 AM   "

## 2024-08-28 ENCOUNTER — Ambulatory Visit (HOSPITAL_COMMUNITY): Attending: Orthopaedic Surgery | Admitting: Occupational Therapy

## 2024-08-28 ENCOUNTER — Ambulatory Visit: Admitting: Orthopedic Surgery

## 2024-08-28 ENCOUNTER — Encounter: Payer: Self-pay | Admitting: Orthopedic Surgery

## 2024-08-28 ENCOUNTER — Ambulatory Visit: Payer: Self-pay

## 2024-08-28 ENCOUNTER — Encounter (HOSPITAL_COMMUNITY): Payer: Self-pay | Admitting: Occupational Therapy

## 2024-08-28 DIAGNOSIS — R29898 Other symptoms and signs involving the musculoskeletal system: Secondary | ICD-10-CM | POA: Diagnosis present

## 2024-08-28 DIAGNOSIS — Z96612 Presence of left artificial shoulder joint: Secondary | ICD-10-CM | POA: Diagnosis not present

## 2024-08-28 DIAGNOSIS — M25612 Stiffness of left shoulder, not elsewhere classified: Secondary | ICD-10-CM | POA: Diagnosis present

## 2024-08-28 DIAGNOSIS — M25512 Pain in left shoulder: Secondary | ICD-10-CM | POA: Diagnosis present

## 2024-08-28 DIAGNOSIS — M75122 Complete rotator cuff tear or rupture of left shoulder, not specified as traumatic: Secondary | ICD-10-CM

## 2024-08-28 NOTE — Progress Notes (Signed)
 Orthopaedic Postop Note  Assessment: Misty Cruz is a 64 y.o. female s/p Left Reverse Shoulder Arthroplasty  DOS: 06/07/2024  Plan: Misty Cruz is doing well at this time.  She is demonstrating some stiffness on physical exam, but I do think this will continue to improve.  Urged her to continue working with physical therapy, including exercises at home.  Radiographs are stable.  Provided reassurance.  She states understanding.  I would like to see her back in 3 months.  Follow-up: Return in about 3 months (around 11/26/2024).  XR at next visit: Left shoulder  Subjective:  Chief Complaint  Patient presents with   Routine Post Op    L Shoulder  DOS 06/07/24     History of Present Illness: Misty Cruz is a 64 y.o. female who presents following the above stated procedure.  Surgery was just short of 3 months ago.  She has been working with physical therapy.  She is not taking any medicines.  She does have some stiffness.  She is doing exercises on her own.  She started doing some resistance strength training.  Review of Systems: No fevers or chills No numbness or tingling No Chest Pain No shortness of breath   Objective: LMP 09/06/2010   Physical Exam:  Alert and oriented, no acute distress  Surgical incision over the anterior shoulder is healed.  No surrounding erythema or drainage.  Sensation is intact in the axillary nerve distribution.  2+ radial pulse.  Sensation intact throughout the left hand.  To 110 degrees of forward flexion.  Passively, she does demonstrate stiffness beyond this range of motion.  Abduction to 100 degrees.  Internal rotation to lumbar spine.  IMAGING: I personally ordered and reviewed the following images:  X-rays left shoulder were obtained in clinic today.  Left reverse shoulder arthroplasty in stable alignment.  No subsidence.  No lucency around the prosthesis.  No new fractures.  Shoulder is reduced.  No bony  lesions.  Impression: Stable left reverse shoulder arthroplasty   Oneil DELENA Horde, MD 08/28/2024 11:33 AM

## 2024-08-28 NOTE — Therapy (Signed)
 " OUTPATIENT OCCUPATIONAL THERAPY ORTHO TREATMENT NOTE  Patient Name: Misty Cruz MRN: [id number] DOB:[birth year], 64 y.o., female Today's Date: 08/28/2024  END OF SESSION:  OT End of Session - 08/28/24 1509     Visit Number 14    Number of Visits 21    Date for Recertification  09/28/24    Authorization Type [employer]    OT Start Time 1352    OT Stop Time 1432    OT Time Calculation (min) 40 min    Activity Tolerance Patient tolerated treatment well    Behavior During Therapy [facility] for tasks assessed/performed           Past Medical History:  Diagnosis Date   Anxiety    Diabetes mellitus    History of kidney stones    Hypercholesteremia    Hypertension    Hyperthyroidism    Scoliosis    Thyroid disease    Past Surgical History:  Procedure Laterality Date   BREAST BIOPSY Right 07/14/2023   MM RT BREAST BX W LOC DEV 1ST LESION IMAGE BX SPEC STEREO GUIDE 07/14/2023 [facility]   COLONOSCOPY N/A 06/04/2013   Procedure: COLONOSCOPY;  Surgeon: Margo LITTIE Haddock, MD;  [facility];  Service: Endoscopy;  Laterality: N/A;  9:30 AM   CYSTOSCOPY WITH RETROGRADE PYELOGRAM, URETEROSCOPY AND STENT PLACEMENT Bilateral 03/03/2023   Procedure: CYSTOSCOPY WITH RETROGRADE PYELOGRAM, URETEROSCOPY AND STENT PLACEMENT;  Surgeon: Sherrilee Belvie LITTIE, MD;  Location: [facility];  Service: Urology;  Laterality: Bilateral;   CYSTOSCOPY/RETROGRADE/URETEROSCOPY/STONE EXTRACTION WITH BASKET Bilateral 03/01/2024   Procedure: CYSTOSCOPY, WITH CALCULUS REMOVAL USING BASKET;  Surgeon: Sherrilee Belvie LITTIE, MD;  Location: [facility];  Service: Urology;  Laterality: Bilateral;   CYSTOSCOPY/URETEROSCOPY/HOLMIUM LASER/STENT PLACEMENT Bilateral 03/01/2024   Procedure: BILATERAL CYSTOSCOPY/URETEROSCOPY/HOLMIUM LASER,RIGHT STENT PLACEMENT;  Surgeon: Sherrilee Belvie LITTIE, MD;  Location: [facility];  Service: Urology;  Laterality: Bilateral;   HOLMIUM LASER APPLICATION Bilateral 03/03/2023   Procedure:  HOLMIUM LASER APPLICATION;  Surgeon: Sherrilee Belvie LITTIE, MD;  Location: [facility];  Service: Urology;  Laterality: Bilateral;   HOLMIUM LASER APPLICATION Bilateral 03/01/2024   Procedure: HOLMIUM LASER APPLICATION;  Surgeon: Sherrilee Belvie LITTIE, MD;  Location: [facility];  Service: Urology;  Laterality: Bilateral;   REVERSE SHOULDER ARTHROPLASTY Left 06/07/2024   Procedure: ARTHROPLASTY, SHOULDER, TOTAL, REVERSE;  Surgeon: Onesimo Oneil LABOR, MD;  Location: [facility];  Service: Orthopedics;  Laterality: Left;   STONE EXTRACTION WITH BASKET Bilateral 03/03/2023   Procedure: STONE EXTRACTION WITH BASKET;  Surgeon: Sherrilee Belvie LITTIE, MD;  Location: [facility];  Service: Urology;  Laterality: Bilateral;   TUBAL LIGATION     Patient Active Problem List   Diagnosis Date Noted   Left rotator cuff tear 06/07/2024   Arthritis of right knee 03/20/2024   Hematuria 03/20/2024   Hypothyroidism 03/20/2024   Obesity 03/20/2024   Seasonal allergies 03/20/2024   Hypertensive disorder 11/30/2023   Former smoker 11/30/2023   Hyperlipemia 11/30/2023   Hyperlipidemia associated with type 2 diabetes mellitus (HCC) 11/30/2023   Vitamin D deficiency 11/30/2023   Nephrolithiasis 03/03/2023   Diabetes mellitus (HCC) 12/10/2015    PCP: Margarete Gibney, FNP REFERRING PROVIDER: Onesimo Oneil, MD  ONSET DATE: 06/07/24  REFERRING DIAG: L reverse total shoulder   THERAPY DIAG:  Stiffness of left shoulder, not elsewhere classified  Acute pain of left shoulder  Other symptoms and signs involving the musculoskeletal system  Rationale for Evaluation and Treatment: Rehabilitation  SUBJECTIVE:   SUBJECTIVE STATEMENT: It still feels tight  Pt accompanied by: self  PERTINENT HISTORY: 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 Yes: NPRS scale: 8/10 Pain location: anterior shoulder girdle  Pain  description: aching and heavy Aggravating factors: everything Relieving factors: tylenol   FALLS: Has patient fallen in last 6 months? Yes. Number of falls 1  PLOF: Independent  PATIENT GOALS: To get her arm use back  NEXT MD VISIT: 07/18/24  OBJECTIVE:   HAND DOMINANCE: Right  ADLs: Overall ADLs: Pt unable to reach up or behind her back, requiring mod assist for all dressing and bathing, unable to lift anything to complete cooking or cleaning. Having to sleep in a recliner - unable to sleep in bed due to pain  FUNCTIONAL OUTCOME MEASURES: Quick Dash: 79.55 08/22/24: 65.91  UPPER EXTREMITY ROM:       Assessed in supine, er/IR adducted  Passive ROM Left eval Left 08/22/24  Shoulder flexion 103 153  Shoulder abduction 84 168  Shoulder internal rotation 90 90  Shoulder external rotation 30 51  (Blank rows = not tested)  Assessed in sitting, er/IR adducted  Active ROM Left eval Left 08/22/24  Shoulder flexion  105  Shoulder abduction  80  Shoulder internal rotation  90  Shoulder external rotation  12  (Blank rows = not tested)  UPPER EXTREMITY MMT:     Assessed in seated, er/IR adducted  MMT Left eval Left 08/22/24  Shoulder flexion  3+/5  Shoulder abduction  3+/5  Shoulder internal rotation  3/5  Shoulder external rotation  2+/5  (Blank rows = not tested)  SENSATION: WFL  EDEMA: Mild swelling noted in the hand  OBSERVATIONS: Moderate fascial restriction   TODAY'S TREATMENT:                                                                                                                              DATE:   08/28/24 -Manual Therapy: myofascial release and trigger point applied to biceps, trapezius, and scapular region in order to reduce fascial restrictions and improve pain and ROM.  -P/ROM: supine - flexion, abduction, horizontal abduction, er/IR, x10 -A/ROM: supine - flexion, abduction, protraction, horizontal abduction, er/IR, x10 -Overhead  lacing -Scapular Strengthening: red band, extension, retractions, rows, x15 -Shoulder strengthening: red band, horizontal abduction, er, IR, x15  08/22/24 -Manual Therapy: myofascial release and trigger point applied to biceps, trapezius, and scapular region in order to reduce fascial restrictions and improve pain and ROM.  -P/ROM: supine - flexion, abduction, horizontal abduction, er/IR, x10 -A/ROM: supine - flexion, abduction, protraction, horizontal abduction, er/IR, x15 -A/ROM: seated - flexion, abduction, er/IR, x10 -Reassessment -PNF strengthening: red band, chest pulls, PNF down, yellow band, er pulls, PNF up, x10  08/15/24 -Manual Therapy: myofascial release and trigger point applied to biceps, trapezius, and scapular region in order to reduce fascial restrictions and improve pain and ROM.  -  P/ROM: supine - flexion, abduction, horizontal abduction, er/IR, x10 -A/ROM: supine - flexion, abduction, protraction, horizontal abduction, er/IR, x10 -Scapular Strengthening: red band, extension, retractions, rows, x10 -Shoulder strengthening: red band, horizontal abduction, er, IR, x10   PATIENT EDUCATION: Education details: PNF Strengthening Person educated: Patient Education method: Programmer, Multimedia, Demonstration, and Handouts Education comprehension: verbalized understanding and returned demonstration  HOME EXERCISE PROGRAM: 10/31: Elbow and Wrist ROM and Pendulums 11/4: Table Slides 11/20: AA/ROM 12/1: forward reaching for elbow extension 12/15: A/ROM in side lying 12/24: Scapular Strengthening 12/31: PNF Strengthening  GOALS: Goals reviewed with patient? Yes   SHORT TERM GOALS: Target date: 07/22/24  Pt will be provided with and educated on HEP to improve mobility in LUE required for use during ADL completion.   Goal status: IN PROGRESS  2.  Pt will increase LUE P/ROM by 40 degrees to improve ability to use LUE during dressing tasks with minimal compensatory techniques.    Goal status: MET  3.  Pt will increase LUE strength to 3+/5 to improve ability to reach for items at waist to chest height during bathing and grooming tasks.   Goal status: IN PROGRESS   LONG TERM GOALS: Target date: 08/21/24  Pt will decrease pain in LUE to 3/10 or less to improve ability to sleep for 2+ consecutive hours without waking due to pain.   Goal status: IN PROGRESS  2.  Pt will decrease LUE fascial restrictions to min amounts or less to improve mobility required for functional reaching tasks.   Goal status: IN PROGRESS  3.  Pt will increase LUE A/ROM by 50 degrees to improve ability to use LUE when reaching overhead or behind back during dressing and bathing tasks.   Goal status: IN PROGRESS  4.  Pt will increase LUE strength to 4+/5 or greater to improve ability to use LUE when lifting or carrying items during meal preparation/housework/yardwork tasks.   Goal status: IN PROGRESS  5.  Pt will return to highest level of function using LUE as non-dominant during functional task completion.   Goal status: IN PROGRESS   ASSESSMENT:  CLINICAL IMPRESSION: Pt continuing to work on improving her strength and mobility. ROM actively remains around 65% of full ROM against gravity, with poor form. OT continuing to add strengthening and stabilizing exercises to improve her mobility and encourage better reaching and functional use of her arm. Verbal and tactile cuing provided for positioning and technique, as well as maintaining slow and controlled movements throughout session.   PERFORMANCE DEFICITS: in functional skills including in functional skills including ADLs, IADLs, coordination, tone, ROM, strength, pain, fascial restrictions, muscle spasms, and UE functional use.   PLAN:  OT FREQUENCY: 2x/week  OT DURATION: 8 weeks  PLANNED INTERVENTIONS: 97168 OT Re-evaluation, 97535 self care/ADL training, 02889 therapeutic exercise, 97530 therapeutic activity, 97112  neuromuscular re-education, 97140 manual therapy, 97035 ultrasound, 97010 moist heat, 97032 electrical stimulation (manual), passive range of motion, functional mobility training, energy conservation, coping strategies training, patient/family education, and DME and/or AE instructions  RECOMMENDED OTHER SERVICES: N/A  CONSULTED AND AGREED WITH PLAN OF CARE: Patient  PLAN FOR NEXT SESSION: Manual Therapy, P/ROM, Pendulums, Table Slides   Valentin Nightingale, OTR/L [facility] [phone] Tayte Mcwherter Jillyn Nightingale, OT 08/28/2024, 3:10 PM   "

## 2024-08-29 ENCOUNTER — Encounter: Admitting: Orthopedic Surgery

## 2024-08-31 ENCOUNTER — Ambulatory Visit (HOSPITAL_COMMUNITY): Admitting: Occupational Therapy

## 2024-08-31 ENCOUNTER — Encounter (HOSPITAL_COMMUNITY): Payer: Self-pay | Admitting: Occupational Therapy

## 2024-08-31 DIAGNOSIS — M25612 Stiffness of left shoulder, not elsewhere classified: Secondary | ICD-10-CM | POA: Diagnosis not present

## 2024-08-31 DIAGNOSIS — M25512 Pain in left shoulder: Secondary | ICD-10-CM

## 2024-08-31 DIAGNOSIS — R29898 Other symptoms and signs involving the musculoskeletal system: Secondary | ICD-10-CM

## 2024-08-31 NOTE — Therapy (Signed)
 " OUTPATIENT OCCUPATIONAL THERAPY ORTHO TREATMENT NOTE  Patient Name: Misty Cruz MRN: [id number] DOB:1961-05-22, 64 y.o., female Today's Date: 08/31/2024  END OF SESSION:  OT End of Session - 08/31/24 1336     Visit Number 15    Number of Visits 21    Date for Recertification  09/28/24    Authorization Type [employer]    Progress Note Due on Visit 10    OT Start Time 1325   pt arrived late   OT Stop Time 1403    OT Time Calculation (min) 38 min    Activity Tolerance Patient tolerated treatment well    Behavior During Therapy WFL for tasks assessed/performed            Past Medical History:  Diagnosis Date   Anxiety    Diabetes mellitus    History of kidney stones    Hypercholesteremia    Hypertension    Hyperthyroidism    Scoliosis    Thyroid disease    Past Surgical History:  Procedure Laterality Date   BREAST BIOPSY Right 07/14/2023   MM RT BREAST BX W LOC DEV 1ST LESION IMAGE BX SPEC STEREO GUIDE 07/14/2023 [facility]   COLONOSCOPY N/A 06/04/2013   Procedure: COLONOSCOPY;  Surgeon: Margo LITTIE Haddock, MD;  [facility];  Service: Endoscopy;  Laterality: N/A;  9:30 AM   CYSTOSCOPY WITH RETROGRADE PYELOGRAM, URETEROSCOPY AND STENT PLACEMENT Bilateral 03/03/2023   Procedure: CYSTOSCOPY WITH RETROGRADE PYELOGRAM, URETEROSCOPY AND STENT PLACEMENT;  Surgeon: Sherrilee Belvie LITTIE, MD;  Location: [facility];  Service: Urology;  Laterality: Bilateral;   CYSTOSCOPY/RETROGRADE/URETEROSCOPY/STONE EXTRACTION WITH BASKET Bilateral 03/01/2024   Procedure: CYSTOSCOPY, WITH CALCULUS REMOVAL USING BASKET;  Surgeon: Sherrilee Belvie LITTIE, MD;  Location: [facility];  Service: Urology;  Laterality: Bilateral;   CYSTOSCOPY/URETEROSCOPY/HOLMIUM LASER/STENT PLACEMENT Bilateral 03/01/2024   Procedure: BILATERAL CYSTOSCOPY/URETEROSCOPY/HOLMIUM LASER,RIGHT STENT PLACEMENT;  Surgeon: Sherrilee Belvie LITTIE, MD;  Location: [facility];  Service: Urology;  Laterality: Bilateral;   HOLMIUM  LASER APPLICATION Bilateral 03/03/2023   Procedure: HOLMIUM LASER APPLICATION;  Surgeon: Sherrilee Belvie LITTIE, MD;  Location: [facility];  Service: Urology;  Laterality: Bilateral;   HOLMIUM LASER APPLICATION Bilateral 03/01/2024   Procedure: HOLMIUM LASER APPLICATION;  Surgeon: Sherrilee Belvie LITTIE, MD;  Location: [facility];  Service: Urology;  Laterality: Bilateral;   REVERSE SHOULDER ARTHROPLASTY Left 06/07/2024   Procedure: ARTHROPLASTY, SHOULDER, TOTAL, REVERSE;  Surgeon: Onesimo Oneil LABOR, MD;  Location: [facility];  Service: Orthopedics;  Laterality: Left;   STONE EXTRACTION WITH BASKET Bilateral 03/03/2023   Procedure: STONE EXTRACTION WITH BASKET;  Surgeon: Sherrilee Belvie LITTIE, MD;  Location: [facility];  Service: Urology;  Laterality: Bilateral;   TUBAL LIGATION     Patient Active Problem List   Diagnosis Date Noted   Left rotator cuff tear 06/07/2024   Arthritis of right knee 03/20/2024   Hematuria 03/20/2024   Hypothyroidism 03/20/2024   Obesity 03/20/2024   Seasonal allergies 03/20/2024   Hypertensive disorder 11/30/2023   Former smoker 11/30/2023   Hyperlipemia 11/30/2023   Hyperlipidemia associated with type 2 diabetes mellitus (HCC) 11/30/2023   Vitamin D deficiency 11/30/2023   Nephrolithiasis 03/03/2023   Diabetes mellitus (HCC) 12/10/2015    PCP: Margarete Gibney, FNP REFERRING PROVIDER: Onesimo Oneil, MD  ONSET DATE: 06/07/24  REFERRING DIAG: L reverse total shoulder   THERAPY DIAG:  Stiffness of left shoulder, not elsewhere classified  Acute pain of left shoulder  Other symptoms and signs involving the musculoskeletal system  Rationale for  Evaluation and Treatment: Rehabilitation  SUBJECTIVE:   SUBJECTIVE STATEMENT: S: It's hurting  PERTINENT HISTORY: 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 Yes: NPRS scale: 8/10 Pain location: anterior  shoulder girdle  Pain description: aching and heavy Aggravating factors: everything Relieving factors: tylenol   FALLS: Has patient fallen in last 6 months? Yes. Number of falls 1  PLOF: Independent  PATIENT GOALS: To get her arm use back  NEXT MD VISIT: March 2026  OBJECTIVE:   HAND DOMINANCE: Right  ADLs: Overall ADLs: Pt unable to reach up or behind her back, requiring mod assist for all dressing and bathing, unable to lift anything to complete cooking or cleaning. Having to sleep in a recliner - unable to sleep in bed due to pain  FUNCTIONAL OUTCOME MEASURES: Quick Dash: 79.55 08/22/24: 65.91  UPPER EXTREMITY ROM:       Assessed in supine, er/IR adducted  Passive ROM Left eval Left 08/22/24  Shoulder flexion 103 153  Shoulder abduction 84 168  Shoulder internal rotation 90 90  Shoulder external rotation 30 51  (Blank rows = not tested)  Assessed in sitting, er/IR adducted  Active ROM Left eval Left 08/22/24  Shoulder flexion  105  Shoulder abduction  80  Shoulder internal rotation  90  Shoulder external rotation  12  (Blank rows = not tested)  UPPER EXTREMITY MMT:     Assessed in seated, er/IR adducted  MMT Left eval Left 08/22/24  Shoulder flexion  3+/5  Shoulder abduction  3+/5  Shoulder internal rotation  3/5  Shoulder external rotation  2+/5  (Blank rows = not tested)  SENSATION: WFL  EDEMA: Mild swelling noted in the hand  OBSERVATIONS: Moderate fascial restriction   TODAY'S TREATMENT:                                                                                                                              DATE:  08/31/24 -Manual Therapy: myofascial release and trigger point applied to biceps, trapezius, and scapular region in order to reduce fascial restrictions and improve pain and ROM.  -P/ROM: supine - flexion, abduction, horizontal abduction, er/IR, 5 reps -A/ROM: standing-flexion, abduction, protraction, horizontal abduction,  er/IR, 10 reps -Therapy ball circles: 5 reps each direction -Functional reaching: pt placing 9 cones on middle shelf of overhead cabinet in flexion, removing in abduction -Wall wash: 1' flexion -Scapular theraband: red-row, extension, 10 reps -Functional reaching: pinch tree-pt placing 10 clothespins on vertical bar of pinch tree in flexion, then removing -UBE: level 1, 2' forward, 2' reverse, pace: 4.0  08/28/24 -Manual Therapy: myofascial release and trigger point applied to biceps, trapezius, and scapular region in order to reduce fascial restrictions and improve pain and ROM.  -P/ROM: supine - flexion, abduction, horizontal abduction, er/IR, x10 -A/ROM: supine - flexion, abduction, protraction, horizontal abduction, er/IR, x10 -Overhead  lacing -Scapular Strengthening: red band, extension, retractions, rows, x15 -Shoulder strengthening: red band, horizontal abduction, er, IR, x15  08/22/24 -Manual Therapy: myofascial release and trigger point applied to biceps, trapezius, and scapular region in order to reduce fascial restrictions and improve pain and ROM.  -P/ROM: supine - flexion, abduction, horizontal abduction, er/IR, x10 -A/ROM: supine - flexion, abduction, protraction, horizontal abduction, er/IR, x15 -A/ROM: seated - flexion, abduction, er/IR, x10 -Reassessment -PNF strengthening: red band, chest pulls, PNF down, yellow band, er pulls, PNF up, x10    PATIENT EDUCATION: Education details: Reviewed HEP Person educated: Patient Education method: Explanation, Demonstration, and Handouts Education comprehension: verbalized understanding and returned demonstration  HOME EXERCISE PROGRAM: 10/31: Elbow and Wrist ROM and Pendulums 11/4: Table Slides 11/20: AA/ROM 12/1: forward reaching for elbow extension 12/15: A/ROM in side lying 12/24: Scapular Strengthening 12/31: PNF Strengthening  GOALS: Goals reviewed with patient? Yes   SHORT TERM GOALS: Target date: 07/22/24  Pt  will be provided with and educated on HEP to improve mobility in LUE required for use during ADL completion.   Goal status: IN PROGRESS  2.  Pt will increase LUE P/ROM by 40 degrees to improve ability to use LUE during dressing tasks with minimal compensatory techniques.   Goal status: MET  3.  Pt will increase LUE strength to 3+/5 to improve ability to reach for items at waist to chest height during bathing and grooming tasks.   Goal status: IN PROGRESS   LONG TERM GOALS: Target date: 08/21/24  Pt will decrease pain in LUE to 3/10 or less to improve ability to sleep for 2+ consecutive hours without waking due to pain.   Goal status: IN PROGRESS  2.  Pt will decrease LUE fascial restrictions to min amounts or less to improve mobility required for functional reaching tasks.   Goal status: IN PROGRESS  3.  Pt will increase LUE A/ROM by 50 degrees to improve ability to use LUE when reaching overhead or behind back during dressing and bathing tasks.   Goal status: IN PROGRESS  4.  Pt will increase LUE strength to 4+/5 or greater to improve ability to use LUE when lifting or carrying items during meal preparation/housework/yardwork tasks.   Goal status: IN PROGRESS  5.  Pt will return to highest level of function using LUE as non-dominant during functional task completion.   Goal status: IN PROGRESS   ASSESSMENT:  CLINICAL IMPRESSION: Pt reports high pain today, however is unsure of the cause. Reports HEP is going well. Continued with A/ROM and focused on functional reaching today. Heavy emphasis on posture and performing reaching while reducing trapezius activation and lateral leaning. Pt with mod difficulty during overhead reaching, reports mod fatigue during tasks. Pt reports pain at 6/10 at end of session. Verbal cuing and tactile cuing for form and technique.     PERFORMANCE DEFICITS: in functional skills including in functional skills including ADLs, IADLs, coordination,  tone, ROM, strength, pain, fascial restrictions, muscle spasms, and UE functional use.   PLAN:  OT FREQUENCY: 2x/week  OT DURATION: 8 weeks  PLANNED INTERVENTIONS: 97168 OT Re-evaluation, 97535 self care/ADL training, 02889 therapeutic exercise, 97530 therapeutic activity, 97112 neuromuscular re-education, 97140 manual therapy, 97035 ultrasound, 97010 moist heat, 97032 electrical stimulation (manual), passive range of motion, functional mobility training, energy conservation, coping strategies training, patient/family education, and DME and/or AE instructions  RECOMMENDED OTHER SERVICES: N/A  CONSULTED AND AGREED WITH PLAN OF CARE: Patient  PLAN FOR NEXT SESSION: Manual Therapy, P/ROM, A/ROM,  functional reaching, progressing to strengthening and stability as able.    Sonny Cory, OTR/L  [phone] 08/31/2024, 2:06 PM   "

## 2024-09-03 ENCOUNTER — Ambulatory Visit (HOSPITAL_COMMUNITY): Admitting: Occupational Therapy

## 2024-09-03 ENCOUNTER — Encounter (HOSPITAL_COMMUNITY): Payer: Self-pay | Admitting: Occupational Therapy

## 2024-09-03 DIAGNOSIS — R29898 Other symptoms and signs involving the musculoskeletal system: Secondary | ICD-10-CM

## 2024-09-03 DIAGNOSIS — M25512 Pain in left shoulder: Secondary | ICD-10-CM

## 2024-09-03 DIAGNOSIS — M25612 Stiffness of left shoulder, not elsewhere classified: Secondary | ICD-10-CM

## 2024-09-03 NOTE — Therapy (Signed)
 " OUTPATIENT OCCUPATIONAL THERAPY ORTHO TREATMENT NOTE  Patient Name: Misty Cruz MRN: [id number] DOB:03-Aug-1961, 64 y.o., female Today's Date: 09/03/2024  END OF SESSION:  OT End of Session - 09/03/24 1513     Visit Number 16    Number of Visits 21    Date for Recertification  09/28/24    Authorization Type [employer]    Progress Note Due on Visit 10    OT Start Time 1350    OT Stop Time 1430    OT Time Calculation (min) 40 min    Activity Tolerance Patient tolerated treatment well    Behavior During Therapy [facility] for tasks assessed/performed          Past Medical History:  Diagnosis Date   Anxiety    Diabetes mellitus    History of kidney stones    Hypercholesteremia    Hypertension    Hyperthyroidism    Scoliosis    Thyroid disease    Past Surgical History:  Procedure Laterality Date   BREAST BIOPSY Right 07/14/2023   MM RT BREAST BX W LOC DEV 1ST LESION IMAGE BX SPEC STEREO GUIDE 07/14/2023 [facility]   COLONOSCOPY N/A 06/04/2013   Procedure: COLONOSCOPY;  Surgeon: Margo LITTIE Haddock, MD;  [facility];  Service: Endoscopy;  Laterality: N/A;  9:30 AM   CYSTOSCOPY WITH RETROGRADE PYELOGRAM, URETEROSCOPY AND STENT PLACEMENT Bilateral 03/03/2023   Procedure: CYSTOSCOPY WITH RETROGRADE PYELOGRAM, URETEROSCOPY AND STENT PLACEMENT;  Surgeon: Sherrilee Belvie LITTIE, MD;  Location: [facility];  Service: Urology;  Laterality: Bilateral;   CYSTOSCOPY/RETROGRADE/URETEROSCOPY/STONE EXTRACTION WITH BASKET Bilateral 03/01/2024   Procedure: CYSTOSCOPY, WITH CALCULUS REMOVAL USING BASKET;  Surgeon: Sherrilee Belvie LITTIE, MD;  Location: [facility];  Service: Urology;  Laterality: Bilateral;   CYSTOSCOPY/URETEROSCOPY/HOLMIUM LASER/STENT PLACEMENT Bilateral 03/01/2024   Procedure: BILATERAL CYSTOSCOPY/URETEROSCOPY/HOLMIUM LASER,RIGHT STENT PLACEMENT;  Surgeon: Sherrilee Belvie LITTIE, MD;  Location: [facility];  Service: Urology;  Laterality: Bilateral;   HOLMIUM LASER APPLICATION  Bilateral 03/03/2023   Procedure: HOLMIUM LASER APPLICATION;  Surgeon: Sherrilee Belvie LITTIE, MD;  Location: [facility];  Service: Urology;  Laterality: Bilateral;   HOLMIUM LASER APPLICATION Bilateral 03/01/2024   Procedure: HOLMIUM LASER APPLICATION;  Surgeon: Sherrilee Belvie LITTIE, MD;  Location: [facility];  Service: Urology;  Laterality: Bilateral;   REVERSE SHOULDER ARTHROPLASTY Left 06/07/2024   Procedure: ARTHROPLASTY, SHOULDER, TOTAL, REVERSE;  Surgeon: Onesimo Oneil LABOR, MD;  Location: [facility];  Service: Orthopedics;  Laterality: Left;   STONE EXTRACTION WITH BASKET Bilateral 03/03/2023   Procedure: STONE EXTRACTION WITH BASKET;  Surgeon: Sherrilee Belvie LITTIE, MD;  Location: [facility];  Service: Urology;  Laterality: Bilateral;   TUBAL LIGATION     Patient Active Problem List   Diagnosis Date Noted   Left rotator cuff tear 06/07/2024   Arthritis of right knee 03/20/2024   Hematuria 03/20/2024   Hypothyroidism 03/20/2024   Obesity 03/20/2024   Seasonal allergies 03/20/2024   Hypertensive disorder 11/30/2023   Former smoker 11/30/2023   Hyperlipemia 11/30/2023   Hyperlipidemia associated with type 2 diabetes mellitus (HCC) 11/30/2023   Vitamin D deficiency 11/30/2023   Nephrolithiasis 03/03/2023   Diabetes mellitus (HCC) 12/10/2015    PCP: Margarete Gibney, FNP REFERRING PROVIDER: Onesimo Oneil, MD  ONSET DATE: 06/07/24  REFERRING DIAG: L reverse total shoulder   THERAPY DIAG:  Stiffness of left shoulder, not elsewhere classified  Acute pain of left shoulder  Other symptoms and signs involving the musculoskeletal system  Rationale for Evaluation and Treatment: Rehabilitation  SUBJECTIVE:  SUBJECTIVE STATEMENT: S: I think it's getting better  PERTINENT HISTORY: 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 Yes: NPRS scale: 8/10 Pain location: anterior  shoulder girdle  Pain description: aching and heavy Aggravating factors: everything Relieving factors: tylenol   FALLS: Has patient fallen in last 6 months? Yes. Number of falls 1  PLOF: Independent  PATIENT GOALS: To get her arm use back  NEXT MD VISIT: March 2026  OBJECTIVE:   HAND DOMINANCE: Right  ADLs: Overall ADLs: Pt unable to reach up or behind her back, requiring mod assist for all dressing and bathing, unable to lift anything to complete cooking or cleaning. Having to sleep in a recliner - unable to sleep in bed due to pain  FUNCTIONAL OUTCOME MEASURES: Quick Dash: 79.55 08/22/24: 65.91  UPPER EXTREMITY ROM:       Assessed in supine, er/IR adducted  Passive ROM Left eval Left 08/22/24  Shoulder flexion 103 153  Shoulder abduction 84 168  Shoulder internal rotation 90 90  Shoulder external rotation 30 51  (Blank rows = not tested)  Assessed in sitting, er/IR adducted  Active ROM Left eval Left 08/22/24  Shoulder flexion  105  Shoulder abduction  80  Shoulder internal rotation  90  Shoulder external rotation  12  (Blank rows = not tested)  UPPER EXTREMITY MMT:     Assessed in seated, er/IR adducted  MMT Left eval Left 08/22/24  Shoulder flexion  3+/5  Shoulder abduction  3+/5  Shoulder internal rotation  3/5  Shoulder external rotation  2+/5  (Blank rows = not tested)  SENSATION: WFL  EDEMA: Mild swelling noted in the hand  OBSERVATIONS: Moderate fascial restriction   TODAY'S TREATMENT:                                                                                                                              DATE:  09/03/24 -UBE: level 1, 2.5' forwards and backwards -Pulleys: flexion, abduction, x60 -Manual Therapy: myofascial release and trigger point applied to biceps, trapezius, and scapular region in order to reduce fascial restrictions and improve pain and ROM.  -A/ROM: supine - flexion, abduction, protraction, horizontal  abduction, er/IR, x10 -Functional Reaching: counter to 1st shelf to 2nd shelf, back to 1st shelf in flexion, 1st shelf to counter in abduction, 10 cones  08/31/24 -Manual Therapy: myofascial release and trigger point applied to biceps, trapezius, and scapular region in order to reduce fascial restrictions and improve pain and ROM.  -P/ROM: supine - flexion, abduction, horizontal abduction, er/IR, 5 reps -A/ROM: standing-flexion, abduction, protraction, horizontal abduction, er/IR, 10 reps -Therapy ball circles: 5 reps each direction -Functional reaching: pt placing 9 cones on middle shelf of overhead cabinet in flexion, removing in abduction -Wall wash: 1' flexion -Scapular theraband: red-row, extension, 10 reps -Functional reaching: pinch tree-pt placing 10 clothespins  on vertical bar of pinch tree in flexion, then removing -UBE: level 1, 2' forward, 2' reverse, pace: 4.0  08/28/24 -Manual Therapy: myofascial release and trigger point applied to biceps, trapezius, and scapular region in order to reduce fascial restrictions and improve pain and ROM.  -P/ROM: supine - flexion, abduction, horizontal abduction, er/IR, x10 -A/ROM: supine - flexion, abduction, protraction, horizontal abduction, er/IR, x10 -Overhead lacing -Scapular Strengthening: red band, extension, retractions, rows, x15 -Shoulder strengthening: red band, horizontal abduction, er, IR, x15   PATIENT EDUCATION: Education details: Reviewed HEP Person educated: Patient Education method: Explanation, Demonstration, and Handouts Education comprehension: verbalized understanding and returned demonstration  HOME EXERCISE PROGRAM: 10/31: Elbow and Wrist ROM and Pendulums 11/4: Table Slides 11/20: AA/ROM 12/1: forward reaching for elbow extension 12/15: A/ROM in side lying 12/24: Scapular Strengthening 12/31: PNF Strengthening  GOALS: Goals reviewed with patient? Yes   SHORT TERM GOALS: Target date: 07/22/24  Pt will be  provided with and educated on HEP to improve mobility in LUE required for use during ADL completion.   Goal status: IN PROGRESS  2.  Pt will increase LUE P/ROM by 40 degrees to improve ability to use LUE during dressing tasks with minimal compensatory techniques.   Goal status: MET  3.  Pt will increase LUE strength to 3+/5 to improve ability to reach for items at waist to chest height during bathing and grooming tasks.   Goal status: IN PROGRESS   LONG TERM GOALS: Target date: 08/21/24  Pt will decrease pain in LUE to 3/10 or less to improve ability to sleep for 2+ consecutive hours without waking due to pain.   Goal status: IN PROGRESS  2.  Pt will decrease LUE fascial restrictions to min amounts or less to improve mobility required for functional reaching tasks.   Goal status: IN PROGRESS  3.  Pt will increase LUE A/ROM by 50 degrees to improve ability to use LUE when reaching overhead or behind back during dressing and bathing tasks.   Goal status: IN PROGRESS  4.  Pt will increase LUE strength to 4+/5 or greater to improve ability to use LUE when lifting or carrying items during meal preparation/housework/yardwork tasks.   Goal status: IN PROGRESS  5.  Pt will return to highest level of function using LUE as non-dominant during functional task completion.   Goal status: IN PROGRESS   ASSESSMENT:  CLINICAL IMPRESSION: Pt continues to present with stiffness and poor functional ROM. She worked on reaching up into cabinets both in flexion and abduction, as well as reaching on to a wall, while standing straight. Pt has difficulty reaching into flexion without also lifting arm into abduction, despite OT attempting to assist with blocking the arm from abducting and cuing for standing up straight. OT also providing verbal cuing for positioning and technique throughout.   PERFORMANCE DEFICITS: in functional skills including in functional skills including ADLs, IADLs,  coordination, tone, ROM, strength, pain, fascial restrictions, muscle spasms, and UE functional use.   PLAN:  OT FREQUENCY: 2x/week  OT DURATION: 8 weeks  PLANNED INTERVENTIONS: 97168 OT Re-evaluation, 97535 self care/ADL training, 02889 therapeutic exercise, 97530 therapeutic activity, 97112 neuromuscular re-education, 97140 manual therapy, 97035 ultrasound, 97010 moist heat, 97032 electrical stimulation (manual), passive range of motion, functional mobility training, energy conservation, coping strategies training, patient/family education, and DME and/or AE instructions  RECOMMENDED OTHER SERVICES: N/A  CONSULTED AND AGREED WITH PLAN OF CARE: Patient  PLAN FOR NEXT SESSION: Manual Therapy, P/ROM, A/ROM, functional reaching, progressing  to strengthening and stability as able.    Valentin Nightingale, OTR/L [phone] 09/03/2024, 3:14 PM   "

## 2024-09-06 ENCOUNTER — Ambulatory Visit (HOSPITAL_COMMUNITY): Admitting: Occupational Therapy

## 2024-09-10 ENCOUNTER — Encounter (HOSPITAL_COMMUNITY): Payer: Self-pay | Admitting: Occupational Therapy

## 2024-09-10 ENCOUNTER — Ambulatory Visit (HOSPITAL_COMMUNITY): Admitting: Occupational Therapy

## 2024-09-10 DIAGNOSIS — M25512 Pain in left shoulder: Secondary | ICD-10-CM

## 2024-09-10 DIAGNOSIS — M25612 Stiffness of left shoulder, not elsewhere classified: Secondary | ICD-10-CM | POA: Diagnosis not present

## 2024-09-10 DIAGNOSIS — R29898 Other symptoms and signs involving the musculoskeletal system: Secondary | ICD-10-CM

## 2024-09-10 NOTE — Therapy (Signed)
 " OUTPATIENT OCCUPATIONAL THERAPY ORTHO TREATMENT NOTE  Patient Name: Misty Cruz MRN: [id number] DOB:[birth year], 64 y.o., female Today's Date: 09/10/2024  END OF SESSION:  OT End of Session - 09/10/24 1443     Visit Number 17    Number of Visits 21    Date for Recertification  09/28/24    Authorization Type [employer]    Progress Note Due on Visit 10    OT Start Time 1351    OT Stop Time 1430    OT Time Calculation (min) 39 min    Activity Tolerance Patient tolerated treatment well    Behavior During Therapy WFL for tasks assessed/performed          Past Medical History:  Diagnosis Date   Anxiety    Diabetes mellitus    History of kidney stones    Hypercholesteremia    Hypertension    Hyperthyroidism    Scoliosis    Thyroid disease    Past Surgical History:  Procedure Laterality Date   BREAST BIOPSY Right 07/14/2023   MM RT BREAST BX W LOC DEV 1ST LESION IMAGE BX SPEC STEREO GUIDE 07/14/2023 [facility]   COLONOSCOPY N/A 06/04/2013   Procedure: COLONOSCOPY;  Surgeon: Margo LITTIE Haddock, MD;  [facility];  Service: Endoscopy;  Laterality: N/A;  9:30 AM   CYSTOSCOPY WITH RETROGRADE PYELOGRAM, URETEROSCOPY AND STENT PLACEMENT Bilateral 03/03/2023   Procedure: CYSTOSCOPY WITH RETROGRADE PYELOGRAM, URETEROSCOPY AND STENT PLACEMENT;  Surgeon: Sherrilee Belvie LITTIE, MD;  Location: [facility];  Service: Urology;  Laterality: Bilateral;   CYSTOSCOPY/RETROGRADE/URETEROSCOPY/STONE EXTRACTION WITH BASKET Bilateral 03/01/2024   Procedure: CYSTOSCOPY, WITH CALCULUS REMOVAL USING BASKET;  Surgeon: Sherrilee Belvie LITTIE, MD;  Location: [facility];  Service: Urology;  Laterality: Bilateral;   CYSTOSCOPY/URETEROSCOPY/HOLMIUM LASER/STENT PLACEMENT Bilateral 03/01/2024   Procedure: BILATERAL CYSTOSCOPY/URETEROSCOPY/HOLMIUM LASER,RIGHT STENT PLACEMENT;  Surgeon: Sherrilee Belvie LITTIE, MD;  Location: [facility];  Service: Urology;  Laterality: Bilateral;   HOLMIUM LASER APPLICATION  Bilateral 03/03/2023   Procedure: HOLMIUM LASER APPLICATION;  Surgeon: Sherrilee Belvie LITTIE, MD;  Location: [facility];  Service: Urology;  Laterality: Bilateral;   HOLMIUM LASER APPLICATION Bilateral 03/01/2024   Procedure: HOLMIUM LASER APPLICATION;  Surgeon: Sherrilee Belvie LITTIE, MD;  Location: [facility];  Service: Urology;  Laterality: Bilateral;   REVERSE SHOULDER ARTHROPLASTY Left 06/07/2024   Procedure: ARTHROPLASTY, SHOULDER, TOTAL, REVERSE;  Surgeon: Onesimo Oneil LABOR, MD;  Location: [facility];  Service: Orthopedics;  Laterality: Left;   STONE EXTRACTION WITH BASKET Bilateral 03/03/2023   Procedure: STONE EXTRACTION WITH BASKET;  Surgeon: Sherrilee Belvie LITTIE, MD;  Location: [facility];  Service: Urology;  Laterality: Bilateral;   TUBAL LIGATION     Patient Active Problem List   Diagnosis Date Noted   Left rotator cuff tear 06/07/2024   Arthritis of right knee 03/20/2024   Hematuria 03/20/2024   Hypothyroidism 03/20/2024   Obesity 03/20/2024   Seasonal allergies 03/20/2024   Hypertensive disorder 11/30/2023   Former smoker 11/30/2023   Hyperlipemia 11/30/2023   Hyperlipidemia associated with type 2 diabetes mellitus (HCC) 11/30/2023   Vitamin D deficiency 11/30/2023   Nephrolithiasis 03/03/2023   Diabetes mellitus (HCC) 12/10/2015    PCP: Margarete Gibney, FNP REFERRING PROVIDER: Onesimo Oneil, MD  ONSET DATE: 06/07/24  REFERRING DIAG: L reverse total shoulder   THERAPY DIAG:  Stiffness of left shoulder, not elsewhere classified  Acute pain of left shoulder  Other symptoms and signs involving the musculoskeletal system  Rationale for Evaluation and Treatment: Rehabilitation  SUBJECTIVE:  SUBJECTIVE STATEMENT: S: I think it's getting better  PERTINENT HISTORY: 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 Yes: NPRS scale: 8/10 Pain location: anterior  shoulder girdle  Pain description: aching and heavy Aggravating factors: everything Relieving factors: tylenol   FALLS: Has patient fallen in last 6 months? Yes. Number of falls 1  PLOF: Independent  PATIENT GOALS: To get her arm use back  NEXT MD VISIT: March 2026  OBJECTIVE:   HAND DOMINANCE: Right  ADLs: Overall ADLs: Pt unable to reach up or behind her back, requiring mod assist for all dressing and bathing, unable to lift anything to complete cooking or cleaning. Having to sleep in a recliner - unable to sleep in bed due to pain  FUNCTIONAL OUTCOME MEASURES: Quick Dash: 79.55 08/22/24: 65.91  UPPER EXTREMITY ROM:       Assessed in supine, er/IR adducted  Passive ROM Left eval Left 08/22/24  Shoulder flexion 103 153  Shoulder abduction 84 168  Shoulder internal rotation 90 90  Shoulder external rotation 30 51  (Blank rows = not tested)  Assessed in sitting, er/IR adducted  Active ROM Left eval Left 08/22/24  Shoulder flexion  105  Shoulder abduction  80  Shoulder internal rotation  90  Shoulder external rotation  12  (Blank rows = not tested)  UPPER EXTREMITY MMT:     Assessed in seated, er/IR adducted  MMT Left eval Left 08/22/24  Shoulder flexion  3+/5  Shoulder abduction  3+/5  Shoulder internal rotation  3/5  Shoulder external rotation  2+/5  (Blank rows = not tested)  SENSATION: WFL  EDEMA: Mild swelling noted in the hand  OBSERVATIONS: Moderate fascial restriction   TODAY'S TREATMENT:                                                                                                                              DATE:   09/10/24 -Pulleys: flexion, abduction, x60 -AA/ROM: 2#, protraction, flexion, abduction, horizontal abduction, er/IR, x15 -A/ROM: protraction, flexion, abduction, horizontal abduction, er/IR, x15 -Overhead lacing - chain put 2 holes down from the top - mod difficulty -Scapular Strengthening: red band, extension,  retraction, rows, x15 -Shoulder Strengthening: red band, flexion, abduction, er, IR, horizontal abduction, x15  09/03/24 -UBE: level 1, 2.5' forwards and backwards -Pulleys: flexion, abduction, x60 -Manual Therapy: myofascial release and trigger point applied to biceps, trapezius, and scapular region in order to reduce fascial restrictions and improve pain and ROM.  -A/ROM: supine - flexion, abduction, protraction, horizontal abduction, er/IR, x10 -Functional Reaching: counter to 1st shelf to 2nd shelf, back to 1st shelf in flexion, 1st shelf to counter in abduction, 10 cones  08/31/24 -Manual Therapy: myofascial release and trigger point applied to biceps, trapezius, and scapular region in order to reduce fascial restrictions and improve pain and ROM.  -P/ROM: supine -  flexion, abduction, horizontal abduction, er/IR, 5 reps -A/ROM: standing-flexion, abduction, protraction, horizontal abduction, er/IR, 10 reps -Therapy ball circles: 5 reps each direction -Functional reaching: pt placing 9 cones on middle shelf of overhead cabinet in flexion, removing in abduction -Wall wash: 1' flexion -Scapular theraband: red-row, extension, 10 reps -Functional reaching: pinch tree-pt placing 10 clothespins on vertical bar of pinch tree in flexion, then removing -UBE: level 1, 2' forward, 2' reverse, pace: 4.0  08/28/24 -Manual Therapy: myofascial release and trigger point applied to biceps, trapezius, and scapular region in order to reduce fascial restrictions and improve pain and ROM.  -P/ROM: supine - flexion, abduction, horizontal abduction, er/IR, x10 -A/ROM: supine - flexion, abduction, protraction, horizontal abduction, er/IR, x10 -Overhead lacing -Scapular Strengthening: red band, extension, retractions, rows, x15 -Shoulder strengthening: red band, horizontal abduction, er, IR, x15   PATIENT EDUCATION: Education details: Reviewed HEP Person educated: Patient Education method: Explanation,  Demonstration, and Handouts Education comprehension: verbalized understanding and returned demonstration  HOME EXERCISE PROGRAM: 10/31: Elbow and Wrist ROM and Pendulums 11/4: Table Slides 11/20: AA/ROM 12/1: forward reaching for elbow extension 12/15: A/ROM in side lying 12/24: Scapular Strengthening 12/31: PNF Strengthening  GOALS: Goals reviewed with patient? Yes   SHORT TERM GOALS: Target date: 07/22/24  Pt will be provided with and educated on HEP to improve mobility in LUE required for use during ADL completion.   Goal status: IN PROGRESS  2.  Pt will increase LUE P/ROM by 40 degrees to improve ability to use LUE during dressing tasks with minimal compensatory techniques.   Goal status: MET  3.  Pt will increase LUE strength to 3+/5 to improve ability to reach for items at waist to chest height during bathing and grooming tasks.   Goal status: IN PROGRESS   LONG TERM GOALS: Target date: 08/21/24  Pt will decrease pain in LUE to 3/10 or less to improve ability to sleep for 2+ consecutive hours without waking due to pain.   Goal status: IN PROGRESS  2.  Pt will decrease LUE fascial restrictions to min amounts or less to improve mobility required for functional reaching tasks.   Goal status: IN PROGRESS  3.  Pt will increase LUE A/ROM by 50 degrees to improve ability to use LUE when reaching overhead or behind back during dressing and bathing tasks.   Goal status: IN PROGRESS  4.  Pt will increase LUE strength to 4+/5 or greater to improve ability to use LUE when lifting or carrying items during meal preparation/housework/yardwork tasks.   Goal status: IN PROGRESS  5.  Pt will return to highest level of function using LUE as non-dominant during functional task completion.   Goal status: IN PROGRESS   ASSESSMENT:  CLINICAL IMPRESSION: This session OT and pt worked on functionally moving and using her arm. She was able to reach over head with 2# dowel rod  using BUE, however once completed actively she continues to demonstrate increased weakness and movement limitations. Pt fatigued quickly and required multiple short rest breaks, as well as encouragement to push through discomfort to improve her mobility.   PERFORMANCE DEFICITS: in functional skills including in functional skills including ADLs, IADLs, coordination, tone, ROM, strength, pain, fascial restrictions, muscle spasms, and UE functional use.   PLAN:  OT FREQUENCY: 2x/week  OT DURATION: 8 weeks  PLANNED INTERVENTIONS: 97168 OT Re-evaluation, 97535 self care/ADL training, 02889 therapeutic exercise, 97530 therapeutic activity, 97112 neuromuscular re-education, 97140 manual therapy, 97035 ultrasound, 97010 moist heat, 97032 electrical stimulation (manual),  passive range of motion, functional mobility training, energy conservation, coping strategies training, patient/family education, and DME and/or AE instructions  RECOMMENDED OTHER SERVICES: N/A  CONSULTED AND AGREED WITH PLAN OF CARE: Patient  PLAN FOR NEXT SESSION: Manual Therapy, P/ROM, A/ROM, functional reaching, progressing to strengthening and stability as able.    Valentin Nightingale, OTR/L [phone] 09/10/2024, 2:44 PM   "

## 2024-09-13 ENCOUNTER — Ambulatory Visit (HOSPITAL_COMMUNITY): Admitting: Occupational Therapy

## 2024-09-19 ENCOUNTER — Ambulatory Visit (HOSPITAL_COMMUNITY): Admitting: Occupational Therapy

## 2024-09-26 ENCOUNTER — Ambulatory Visit (HOSPITAL_COMMUNITY): Attending: Orthopaedic Surgery | Admitting: Occupational Therapy

## 2024-09-26 ENCOUNTER — Telehealth (HOSPITAL_COMMUNITY): Payer: Self-pay | Admitting: Occupational Therapy

## 2024-09-26 NOTE — Telephone Encounter (Signed)
 No show #1  OT attempted to call pt regarding No Show on 09/26/24. Pt did not answer and there was no Voicemail box set up. OT will follow up as able.   Valentin Nightingale, OTR/L [facility] [phone]

## 2024-10-08 ENCOUNTER — Ambulatory Visit (HOSPITAL_COMMUNITY): Admitting: Occupational Therapy

## 2024-11-26 ENCOUNTER — Other Ambulatory Visit (HOSPITAL_COMMUNITY)

## 2024-11-27 ENCOUNTER — Ambulatory Visit: Admitting: Orthopedic Surgery

## 2024-12-17 ENCOUNTER — Ambulatory Visit: Admitting: Urology
